# Patient Record
Sex: Female | Born: 1960 | Race: White | Hispanic: No | Marital: Single | Smoking: Current every day smoker
Health system: Southern US, Community
[De-identification: ages and names within clinical notes are randomized; demographics above are authoritative.]

## PROBLEM LIST (undated history)

## (undated) DIAGNOSIS — Z9889 Other specified postprocedural states: Secondary | ICD-10-CM

## (undated) DIAGNOSIS — H9190 Unspecified hearing loss, unspecified ear: Secondary | ICD-10-CM

## (undated) DIAGNOSIS — F419 Anxiety disorder, unspecified: Secondary | ICD-10-CM

## (undated) DIAGNOSIS — R110 Nausea: Secondary | ICD-10-CM

## (undated) DIAGNOSIS — N809 Endometriosis, unspecified: Secondary | ICD-10-CM

## (undated) DIAGNOSIS — I10 Essential (primary) hypertension: Secondary | ICD-10-CM

## (undated) DIAGNOSIS — E785 Hyperlipidemia, unspecified: Secondary | ICD-10-CM

## (undated) DIAGNOSIS — H539 Unspecified visual disturbance: Principal | ICD-10-CM

## (undated) DIAGNOSIS — C801 Malignant (primary) neoplasm, unspecified: Secondary | ICD-10-CM

## (undated) DIAGNOSIS — F32A Depression, unspecified: Secondary | ICD-10-CM

## (undated) DIAGNOSIS — J45909 Unspecified asthma, uncomplicated: Secondary | ICD-10-CM

## (undated) DIAGNOSIS — R05 Cough: Secondary | ICD-10-CM

## (undated) DIAGNOSIS — Z923 Personal history of irradiation: Secondary | ICD-10-CM

## (undated) DIAGNOSIS — R1310 Dysphagia, unspecified: Secondary | ICD-10-CM

## (undated) DIAGNOSIS — R51 Headache: Secondary | ICD-10-CM

## (undated) DIAGNOSIS — M419 Scoliosis, unspecified: Secondary | ICD-10-CM

## (undated) DIAGNOSIS — F329 Major depressive disorder, single episode, unspecified: Secondary | ICD-10-CM

## (undated) DIAGNOSIS — R413 Other amnesia: Secondary | ICD-10-CM

## (undated) DIAGNOSIS — R070 Pain in throat: Secondary | ICD-10-CM

## (undated) DIAGNOSIS — M1990 Unspecified osteoarthritis, unspecified site: Secondary | ICD-10-CM

## (undated) DIAGNOSIS — IMO0002 Reserved for concepts with insufficient information to code with codable children: Secondary | ICD-10-CM

## (undated) DIAGNOSIS — IMO0001 Reserved for inherently not codable concepts without codable children: Secondary | ICD-10-CM

## (undated) DIAGNOSIS — R112 Nausea with vomiting, unspecified: Secondary | ICD-10-CM

## (undated) DIAGNOSIS — E7800 Pure hypercholesterolemia, unspecified: Secondary | ICD-10-CM

## (undated) DIAGNOSIS — R059 Cough, unspecified: Secondary | ICD-10-CM

## (undated) HISTORY — DX: Pain in throat: R07.0

## (undated) HISTORY — DX: Hyperlipidemia, unspecified: E78.5

## (undated) HISTORY — DX: Other amnesia: R41.3

## (undated) HISTORY — DX: Unspecified osteoarthritis, unspecified site: M19.90

## (undated) HISTORY — DX: Dysphagia, unspecified: R13.10

## (undated) HISTORY — DX: Unspecified hearing loss, unspecified ear: H91.90

## (undated) HISTORY — PX: SHOULDER SURGERY: SHX246

## (undated) HISTORY — DX: Major depressive disorder, single episode, unspecified: F32.9

## (undated) HISTORY — PX: CERVICAL LAMINECTOMY: SHX94

## (undated) HISTORY — DX: Nausea: R11.0

## (undated) HISTORY — DX: Endometriosis, unspecified: N80.9

## (undated) HISTORY — DX: Depression, unspecified: F32.A

## (undated) HISTORY — DX: Unspecified visual disturbance: H53.9

## (undated) HISTORY — DX: Headache: R51

---

## 1985-10-06 HISTORY — PX: ABDOMINAL HYSTERECTOMY: SHX81

## 1998-04-02 ENCOUNTER — Other Ambulatory Visit: Admission: RE | Admit: 1998-04-02 | Discharge: 1998-04-02 | Payer: Self-pay | Admitting: Obstetrics and Gynecology

## 1998-10-11 ENCOUNTER — Encounter: Admission: RE | Admit: 1998-10-11 | Discharge: 1999-01-09 | Payer: Self-pay | Admitting: Anesthesiology

## 1998-12-04 ENCOUNTER — Ambulatory Visit (HOSPITAL_COMMUNITY): Admission: RE | Admit: 1998-12-04 | Discharge: 1998-12-04 | Payer: Self-pay | Admitting: Specialist

## 1998-12-04 ENCOUNTER — Encounter: Payer: Self-pay | Admitting: Specialist

## 1998-12-20 ENCOUNTER — Encounter: Payer: Self-pay | Admitting: Specialist

## 1998-12-27 ENCOUNTER — Encounter: Payer: Self-pay | Admitting: Specialist

## 1998-12-27 ENCOUNTER — Observation Stay (HOSPITAL_COMMUNITY): Admission: RE | Admit: 1998-12-27 | Discharge: 1998-12-28 | Payer: Self-pay | Admitting: Specialist

## 2001-04-07 ENCOUNTER — Encounter: Admission: RE | Admit: 2001-04-07 | Discharge: 2001-04-07 | Payer: Self-pay | Admitting: Family Medicine

## 2001-04-07 ENCOUNTER — Encounter: Payer: Self-pay | Admitting: Family Medicine

## 2003-08-11 ENCOUNTER — Encounter: Admission: RE | Admit: 2003-08-11 | Discharge: 2003-08-11 | Payer: Self-pay | Admitting: Family Medicine

## 2004-04-05 ENCOUNTER — Encounter: Admission: RE | Admit: 2004-04-05 | Discharge: 2004-04-05 | Payer: Self-pay | Admitting: Neurosurgery

## 2004-04-24 ENCOUNTER — Ambulatory Visit (HOSPITAL_COMMUNITY): Admission: RE | Admit: 2004-04-24 | Discharge: 2004-04-25 | Payer: Self-pay | Admitting: Neurosurgery

## 2004-05-30 ENCOUNTER — Encounter: Admission: RE | Admit: 2004-05-30 | Discharge: 2004-05-30 | Payer: Self-pay | Admitting: Neurosurgery

## 2004-09-17 ENCOUNTER — Encounter: Admission: RE | Admit: 2004-09-17 | Discharge: 2004-09-17 | Payer: Self-pay | Admitting: Family Medicine

## 2006-12-04 ENCOUNTER — Encounter: Admission: RE | Admit: 2006-12-04 | Discharge: 2006-12-04 | Payer: Self-pay | Admitting: Family Medicine

## 2009-06-15 ENCOUNTER — Encounter: Admission: RE | Admit: 2009-06-15 | Discharge: 2009-06-15 | Payer: Self-pay | Admitting: Family Medicine

## 2009-08-19 ENCOUNTER — Emergency Department (HOSPITAL_COMMUNITY): Admission: EM | Admit: 2009-08-19 | Discharge: 2009-08-19 | Payer: Self-pay | Admitting: Emergency Medicine

## 2009-11-01 ENCOUNTER — Encounter: Admission: RE | Admit: 2009-11-01 | Discharge: 2009-11-01 | Payer: Self-pay | Admitting: Neurosurgery

## 2009-11-26 ENCOUNTER — Ambulatory Visit (HOSPITAL_COMMUNITY): Admission: RE | Admit: 2009-11-26 | Discharge: 2009-11-27 | Payer: Self-pay | Admitting: Orthopedic Surgery

## 2010-11-28 ENCOUNTER — Encounter (HOSPITAL_BASED_OUTPATIENT_CLINIC_OR_DEPARTMENT_OTHER)
Admission: RE | Admit: 2010-11-28 | Discharge: 2010-11-28 | Disposition: A | Discharge status: Home / Self Care | Payer: BC Managed Care – PPO | Source: Ambulatory Visit | Attending: Orthopedic Surgery | Admitting: Orthopedic Surgery

## 2010-11-28 DIAGNOSIS — Z01812 Encounter for preprocedural laboratory examination: Secondary | ICD-10-CM | POA: Insufficient documentation

## 2010-11-28 DIAGNOSIS — Z01810 Encounter for preprocedural cardiovascular examination: Secondary | ICD-10-CM | POA: Insufficient documentation

## 2010-11-28 LAB — BASIC METABOLIC PANEL
CO2: 27 mEq/L (ref 19–32)
Calcium: 9.3 mg/dL (ref 8.4–10.5)
Chloride: 101 mEq/L (ref 96–112)
GFR calc non Af Amer: 57 mL/min — ABNORMAL LOW (ref >60)

## 2010-12-03 ENCOUNTER — Ambulatory Visit (HOSPITAL_BASED_OUTPATIENT_CLINIC_OR_DEPARTMENT_OTHER)
Admission: RE | Admit: 2010-12-03 | Discharge: 2010-12-04 | Disposition: A | Discharge status: Home / Self Care | Payer: BC Managed Care – PPO | Source: Ambulatory Visit | Attending: Orthopedic Surgery | Admitting: Orthopedic Surgery

## 2010-12-03 DIAGNOSIS — J449 Chronic obstructive pulmonary disease, unspecified: Secondary | ICD-10-CM | POA: Insufficient documentation

## 2010-12-03 DIAGNOSIS — Z01812 Encounter for preprocedural laboratory examination: Secondary | ICD-10-CM | POA: Insufficient documentation

## 2010-12-03 DIAGNOSIS — J4489 Other specified chronic obstructive pulmonary disease: Secondary | ICD-10-CM | POA: Insufficient documentation

## 2010-12-03 DIAGNOSIS — M87029 Idiopathic aseptic necrosis of unspecified humerus: Secondary | ICD-10-CM | POA: Insufficient documentation

## 2010-12-03 DIAGNOSIS — I10 Essential (primary) hypertension: Secondary | ICD-10-CM | POA: Insufficient documentation

## 2010-12-03 DIAGNOSIS — F17200 Nicotine dependence, unspecified, uncomplicated: Secondary | ICD-10-CM | POA: Insufficient documentation

## 2010-12-03 DIAGNOSIS — IMO0002 Reserved for concepts with insufficient information to code with codable children: Secondary | ICD-10-CM | POA: Insufficient documentation

## 2010-12-03 DIAGNOSIS — K219 Gastro-esophageal reflux disease without esophagitis: Secondary | ICD-10-CM | POA: Insufficient documentation
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
## 2010-12-25 LAB — COMPREHENSIVE METABOLIC PANEL
Albumin: 3.9 g/dL (ref 3.5–5.2)
Alkaline Phosphatase: 113 U/L (ref 39–117)
Calcium: 9.3 mg/dL (ref 8.4–10.5)
Chloride: 101 mEq/L (ref 96–112)
GFR calc non Af Amer: >60 mL/min (ref >60)
Total Protein: 7.5 g/dL (ref 6.0–8.3)

## 2010-12-25 LAB — CBC
HCT: 43.2 % (ref 36.0–46.0)
MCV: 97.4 fL (ref 78.0–100.0)
RBC: 4.43 MIL/uL (ref 3.87–5.11)
WBC: 12.8 10*3/uL — ABNORMAL HIGH (ref 4.0–10.5)

## 2011-01-08 LAB — BASIC METABOLIC PANEL
CO2: 24 mEq/L (ref 19–32)
GFR calc Af Amer: >60 mL/min (ref >60)
Glucose, Bld: 85 mg/dL (ref 70–99)
Potassium: 3.2 mEq/L — ABNORMAL LOW (ref 3.5–5.1)
Sodium: 134 mEq/L — ABNORMAL LOW (ref 135–145)

## 2011-01-08 LAB — CBC
HCT: 41.2 % (ref 36.0–46.0)
Hemoglobin: 14.3 g/dL (ref 12.0–15.0)
MCHC: 34.8 g/dL (ref 30.0–36.0)
RBC: 4.24 MIL/uL (ref 3.87–5.11)
RDW: 13.4 % (ref 11.5–15.5)

## 2011-01-08 LAB — POCT I-STAT, CHEM 8
Calcium, Ion: 1.06 mmol/L — ABNORMAL LOW (ref 1.12–1.32)
Chloride: 100 mEq/L (ref 96–112)
Creatinine, Ser: 1 mg/dL (ref 0.4–1.2)
Glucose, Bld: 78 mg/dL (ref 70–99)
HCT: 44 % (ref 36.0–46.0)
Hemoglobin: 15 g/dL (ref 12.0–15.0)

## 2011-01-08 LAB — URINALYSIS, ROUTINE W REFLEX MICROSCOPIC
Glucose, UA: NEGATIVE mg/dL
Ketones, ur: NEGATIVE mg/dL
Leukocytes, UA: NEGATIVE
Nitrite: NEGATIVE
Protein, ur: NEGATIVE mg/dL

## 2011-01-08 LAB — RAPID URINE DRUG SCREEN, HOSP PERFORMED
Benzodiazepines: POSITIVE — AB
Cocaine: NOT DETECTED
Tetrahydrocannabinol: POSITIVE — AB

## 2011-01-08 LAB — URINE MICROSCOPIC-ADD ON

## 2011-02-21 NOTE — Op Note (Signed)
NAME:  Stacey Carroll, Stacey Carroll                          ACCOUNT NO.:  [id number]   MEDICAL RECORD NO.:  [id number]                   PATIENT TYPE:  OIB   LOCATION:  2899                                 FACILITY:  [facility]   PHYSICIAN:  Donalee Citrin, M.D.                     DATE OF BIRTH:  Dec 20, 1960   DATE OF PROCEDURE:  04/24/2004  DATE OF DISCHARGE:                                 OPERATIVE REPORT   PREOPERATIVE DIAGNOSIS:  Severe cervical spondylosis with spondylitis  compression of the C6 and C7 nerve roots on the right.   POSTOPERATIVE DIAGNOSIS:  Severe cervical spondylosis with spondylitis  compression of the C6 and C7 nerve roots on the right.   OPERATION PERFORMED:  Anterior cervical diskectomy and fusion at C5-6 and C6-  7 using  6 mm patellar wedges, 37 mm Atlantis Vision plate and six 13 mm  variable angle screws.   SURGEON:  Donalee Citrin, M.D.   ASSISTANT:  Tia Alert, MD   ANESTHESIA:  General endotracheal.   INDICATIONS FOR PROCEDURE:  The patient is a very pleasant 50 year old  female who has had longstanding neck and right arm pain radiating down to  the thumb and first two fingers of her right hand.  The patient also  developed weakness of the biceps and triceps.  This got progressively worse,  failed all forms of conservative treatment.  Preoperative imaging showed  severe spondylitic disease with degenerative disk disease and collapse at C5-  6 and C6-7 and spondylitic compression of the C6 and C7 neural foramina.  Due to the patient's failure of conservative treatment, preoperative imaging  showing weakness in the C6-C7 nerve root distribution.  The patient was  recommended anterior cervical diskectomy and fusion.  I extensively went  over the risks and benefits of surgery with her.  She understands and agreed  to proceed forward.   DESCRIPTION OF PROCEDURE:  The patient was brought to the operating room was  induced under general anesthesia, placed supine position  with neck in slight  extension with five pounds of halter traction.  The right side of the neck  was prepped and draped in the usual sterile fashion after preoperative x-ray  localized the C5-6 disk space.  A curvilinear incision was made just  inferior to this just off the midline extended toward the  sternocleidomastoid muscle and the superficial layer of the platysmas was  dissected out and divided longitudinally.  The avascular plane between the  sternocleidomastoid muscle and the strap muscles was developed down to  prevertebral fascia.  The prevertebral fascia was incised with Kitners.  Then intraoperative x-ray confirmed localization of the C5-6 disk space.  This was marked by doing a small annulotomy with a 15 blade scalpel.  Then  the Bovie electrocautery was used to reflect the longus colli laterally at  both C5-6 and C6-7.  The anterior margins  of the vertebral bodies were  cleaned out also with electrocautery.  A self-retaining retractor was  placed.  Annulotomy was extended to C5-6 as well as drawn out to C6-7.  Pituitary rongeurs were used __________ annulus and there was large anterior  osteophytic complexes coming off C5-6 and C6-7 interspaces.  These were all  bitten off with Leksell rongeur and a Theatre manager.  Then using a 2 mm  Kerrison punch, the remainder of the anterior osteophytes were underbitten  at both these levels and a high speed drill was used to drill down the  interspace first at C5-6 through the posterior annulus and posterior  osteophytic complexes at both C5-6 and C6-7. Then the operative microscope  was draped and brought into the field.  Under microscopic illumination,  first at C5-6 the remainder of the osteophyte was drilled down at C5-6.  Then using a 1 and 2 mm Kerrison punch, the posterior annulus and posterior  longitudinal ligament were underbitten as well as the large osteophyte  coming off the C6 vertebral body.  The thecal sac was identified.   There was  noted to be severe uncinate hypertrophy compressing both C6 neural foramina.  These were underbitten with a 1 and 2 mm Kerrison punch decompressing the  thecal sac and the proximal aspect of both C6 nerve roots.  There was noted  to be severe spondylitic compression on the C6 nerve root, especially on the  right.  This was decompression out the foramen, explored with an angled  nerve hook and noted to have no further stenosis on each nerve root.  Then  Gelfoam was placed in this interspace and attention was taken to C6-7.  At  C6-7 she predominantly had a very  large osteophyte coming off the C7  vertebral body compressing the right side of her thecal sac and right C7  neural foramina, so in order to get down to it initially this was drilled  down to the posterior osteophytic complex and the longitudinal ligament.  This was underbitten with a 1 mm Kerrison punch decompressing the left-sided  C7 nerve root and through this, we obtained our entry underneath the  posterior longitudinal ligament on the right and removal of the osteophyte  on the right was accomplished with a high speed drill and a 2 mm Kerrison  punch.  At the end of diskectomy, both C7 neural foramina were widely  patent.  The angled black nerve hook was used to explore both neural  foramina and both nerve roots were decompressed.  Then the end plates were  scraped with a __________  upgoing curet at both levels to prepare for the  arthrodesis.  Two 6 mm patellar wedges were sized, selected  and inserted 1  mm deep to the anterior vertebral body line.  The remainder of the anterior  vertebral bodies were contoured to the osteophyte complex to receive the  plate and a 60.4 mm Atlantis Vision plate was selected.  This appeared to be  appropriate size.  Screw holes were drilled in the C5 body and the opposite  corner of the C7 body and two 13 mm variable angle screws which were placed. Fluoroscopy confirmed these  screws to be at proper length and appropriate  position, so the remainder of the four screws were inserted and drilled.  All screws had excellent purchase.  Screws tightened down.  The wound was  copiously irrigated and meticulous hemostasis was maintained.  The platysma  was reapproximated with 3-0  interrupted Vicryl, the subcutaneous tissues  were closed with 2-0 interrupted Vicryl and the skin was closed with running  4-0 subcuticular.  Benzoin and Steri-Strips were applied.  The patient was  then transferred to the recovery room in stable condition.  At the end of  the case, sponge, needle and instrument counts were correct.                                               Donalee Citrin, M.D.    GC/MEDQ  D:  04/24/2004  T:  04/25/2004  Job:  [id number]

## 2012-01-13 ENCOUNTER — Ambulatory Visit: Payer: BC Managed Care – PPO

## 2012-01-13 ENCOUNTER — Other Ambulatory Visit: Payer: Self-pay | Admitting: Family Medicine

## 2012-01-13 DIAGNOSIS — Z1231 Encounter for screening mammogram for malignant neoplasm of breast: Secondary | ICD-10-CM

## 2012-01-19 ENCOUNTER — Ambulatory Visit
Admission: RE | Admit: 2012-01-19 | Discharge: 2012-01-19 | Disposition: A | Discharge status: Home / Self Care | Payer: BC Managed Care – PPO | Source: Ambulatory Visit | Attending: Family Medicine | Admitting: Family Medicine

## 2012-01-19 DIAGNOSIS — Z1231 Encounter for screening mammogram for malignant neoplasm of breast: Secondary | ICD-10-CM

## 2012-11-09 ENCOUNTER — Ambulatory Visit (HOSPITAL_COMMUNITY)
Admission: RE | Admit: 2012-11-09 | Discharge: 2012-11-09 | Disposition: A | Discharge status: Home / Self Care | Payer: Self-pay | Source: Ambulatory Visit | Attending: Physical Medicine and Rehabilitation | Admitting: Physical Medicine and Rehabilitation

## 2012-11-09 ENCOUNTER — Other Ambulatory Visit (HOSPITAL_COMMUNITY): Payer: Self-pay | Admitting: Physical Medicine and Rehabilitation

## 2012-11-09 DIAGNOSIS — M25559 Pain in unspecified hip: Secondary | ICD-10-CM

## 2012-11-09 DIAGNOSIS — M51379 Other intervertebral disc degeneration, lumbosacral region without mention of lumbar back pain or lower extremity pain: Secondary | ICD-10-CM | POA: Insufficient documentation

## 2012-11-09 DIAGNOSIS — M5137 Other intervertebral disc degeneration, lumbosacral region: Secondary | ICD-10-CM | POA: Insufficient documentation

## 2012-11-09 DIAGNOSIS — M8569 Other cyst of bone, multiple sites: Secondary | ICD-10-CM | POA: Insufficient documentation

## 2013-03-01 ENCOUNTER — Other Ambulatory Visit: Payer: Self-pay

## 2013-03-01 DIAGNOSIS — Z1231 Encounter for screening mammogram for malignant neoplasm of breast: Secondary | ICD-10-CM

## 2013-03-11 ENCOUNTER — Ambulatory Visit (INDEPENDENT_AMBULATORY_CARE_PROVIDER_SITE_OTHER): Payer: BC Managed Care – PPO | Admitting: Neurology

## 2013-03-11 ENCOUNTER — Encounter: Payer: Self-pay | Admitting: Neurology

## 2013-03-11 ENCOUNTER — Ambulatory Visit
Admission: RE | Admit: 2013-03-11 | Discharge: 2013-03-11 | Disposition: A | Discharge status: Home / Self Care | Payer: BC Managed Care – PPO | Source: Ambulatory Visit

## 2013-03-11 VITALS — BP 104/75 | HR 81 | Ht 64.0 in | Wt 151.0 lb

## 2013-03-11 DIAGNOSIS — H539 Unspecified visual disturbance: Secondary | ICD-10-CM

## 2013-03-11 DIAGNOSIS — Z1231 Encounter for screening mammogram for malignant neoplasm of breast: Secondary | ICD-10-CM

## 2013-03-11 HISTORY — DX: Unspecified visual disturbance: H53.9
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
## 2013-04-29 ENCOUNTER — Other Ambulatory Visit: Payer: BC Managed Care – PPO

## 2013-10-06 DIAGNOSIS — C801 Malignant (primary) neoplasm, unspecified: Secondary | ICD-10-CM

## 2013-10-06 HISTORY — DX: Malignant (primary) neoplasm, unspecified: C80.1

## 2014-02-14 ENCOUNTER — Other Ambulatory Visit: Payer: Self-pay | Admitting: Orthopaedic Surgery

## 2014-02-14 DIAGNOSIS — M479 Spondylosis, unspecified: Secondary | ICD-10-CM

## 2014-02-24 ENCOUNTER — Ambulatory Visit
Admission: RE | Admit: 2014-02-24 | Discharge: 2014-02-24 | Disposition: A | Discharge status: Home / Self Care | Payer: BC Managed Care – PPO | Source: Ambulatory Visit | Attending: Orthopaedic Surgery | Admitting: Orthopaedic Surgery

## 2014-02-24 VITALS — BP 153/94 | HR 62

## 2014-02-24 DIAGNOSIS — M479 Spondylosis, unspecified: Secondary | ICD-10-CM

## 2014-02-24 MED ORDER — HYDROMORPHONE HCL PF 1 MG/ML IJ SOLN
1.0000 mg | Freq: Once | INTRAMUSCULAR | Status: AC
  Administered 2014-02-24: 1 mg via INTRAMUSCULAR

## 2014-02-24 MED ORDER — DIAZEPAM 5 MG PO TABS
10.0000 mg | ORAL_TABLET | Freq: Once | ORAL | Status: AC
  Administered 2014-02-24: 10 mg via ORAL

## 2014-02-24 MED ORDER — ONDANSETRON HCL 4 MG/2ML IJ SOLN
4.0000 mg | Freq: Once | INTRAMUSCULAR | Status: AC
  Administered 2014-02-24: 4 mg via INTRAMUSCULAR

## 2014-02-24 MED ORDER — IOHEXOL 300 MG/ML  SOLN
10.0000 mL | Freq: Once | INTRAMUSCULAR | Status: AC | PRN
Start: 2014-02-24 — End: 2014-02-24
  Administered 2014-02-24: 10 mL via INTRATHECAL

## 2014-02-24 NOTE — Progress Notes (Signed)
Pt states she has been off wellbutrin, adderall and paxil since Tuesday night. discharge instructions explained to pt.

## 2014-02-24 NOTE — Discharge Instructions (Signed)
Myelogram Discharge Instructions  1. Go home and rest quietly for the next 24 hours.  It is important to lie flat for the next 24 hours.  Get up only to go to the restroom.  You may lie in the bed or on a couch on your back, your stomach, your left side or your right side.  You may have one pillow under your head.  You may have pillows between your knees while you are on your side or under your knees while you are on your back.  2. DO NOT drive today.  Recline the seat as far back as it will go, while still wearing your seat belt, on the way home.  3. You may get up to go to the bathroom as needed.  You may sit up for 10 minutes to eat.  You may resume your normal diet and medications unless otherwise indicated.  Drink lots of extra fluids today and tomorrow.  4. The incidence of headache, nausea, or vomiting is about 5% (one in 20 patients).  If you develop a headache, lie flat and drink plenty of fluids until the headache goes away.  Caffeinated beverages may be helpful.  If you develop severe nausea and vomiting or a headache that does not go away with flat bed rest, call [phone].  5. You may resume normal activities after your 24 hours of bed rest is over; however, do not exert yourself strongly or do any heavy lifting tomorrow. If when you get up you have a headache when standing, go back to bed and force fluids for another 24 hours.  6. Call your physician for a follow-up appointment.  The results of your myelogram will be sent directly to your physician by the following day.  7. If you have any questions or if complications develop after you arrive home, please call [phone].  Discharge instructions have been explained to the patient.  The patient, or the person responsible for the patient, fully understands these instructions.      May resume Wellbutrin, Adderall, and Paxil on Feb 25, 2014, after 11:00 am.

## 2014-03-24 ENCOUNTER — Other Ambulatory Visit: Payer: Self-pay | Admitting: Orthopaedic Surgery

## 2014-03-24 DIAGNOSIS — M545 Low back pain, unspecified: Secondary | ICD-10-CM

## 2014-03-25 ENCOUNTER — Ambulatory Visit
Admission: RE | Admit: 2014-03-25 | Discharge: 2014-03-25 | Disposition: A | Discharge status: Home / Self Care | Payer: BC Managed Care – PPO | Source: Ambulatory Visit | Attending: Orthopaedic Surgery | Admitting: Orthopaedic Surgery

## 2014-03-25 DIAGNOSIS — M545 Low back pain, unspecified: Secondary | ICD-10-CM

## 2014-03-31 ENCOUNTER — Other Ambulatory Visit: Payer: BC Managed Care – PPO

## 2014-04-14 ENCOUNTER — Encounter (HOSPITAL_COMMUNITY): Payer: Self-pay | Admitting: Emergency Medicine

## 2014-04-14 ENCOUNTER — Encounter (HOSPITAL_COMMUNITY): Payer: BC Managed Care – PPO | Admitting: Anesthesiology

## 2014-04-14 ENCOUNTER — Emergency Department (HOSPITAL_COMMUNITY): Payer: BC Managed Care – PPO

## 2014-04-14 ENCOUNTER — Ambulatory Visit (HOSPITAL_COMMUNITY)
Admission: EM | Admit: 2014-04-14 | Discharge: 2014-04-14 | Disposition: A | Discharge status: Home / Self Care | Payer: BC Managed Care – PPO | Attending: Emergency Medicine | Admitting: Emergency Medicine

## 2014-04-14 ENCOUNTER — Encounter (HOSPITAL_COMMUNITY)
Admission: EM | Disposition: A | Discharge status: Home / Self Care | Payer: Self-pay | Source: Home / Self Care | Attending: Emergency Medicine

## 2014-04-14 ENCOUNTER — Emergency Department (HOSPITAL_COMMUNITY): Payer: BC Managed Care – PPO | Admitting: Anesthesiology

## 2014-04-14 DIAGNOSIS — F329 Major depressive disorder, single episode, unspecified: Secondary | ICD-10-CM | POA: Diagnosis not present

## 2014-04-14 DIAGNOSIS — D499 Neoplasm of unspecified behavior of unspecified site: Secondary | ICD-10-CM

## 2014-04-14 DIAGNOSIS — I10 Essential (primary) hypertension: Secondary | ICD-10-CM | POA: Diagnosis not present

## 2014-04-14 DIAGNOSIS — R221 Localized swelling, mass and lump, neck: Secondary | ICD-10-CM

## 2014-04-14 DIAGNOSIS — F411 Generalized anxiety disorder: Secondary | ICD-10-CM | POA: Insufficient documentation

## 2014-04-14 DIAGNOSIS — J392 Other diseases of pharynx: Secondary | ICD-10-CM

## 2014-04-14 DIAGNOSIS — C100 Malignant neoplasm of vallecula: Secondary | ICD-10-CM | POA: Diagnosis not present

## 2014-04-14 DIAGNOSIS — E7800 Pure hypercholesterolemia, unspecified: Secondary | ICD-10-CM | POA: Diagnosis not present

## 2014-04-14 DIAGNOSIS — F17200 Nicotine dependence, unspecified, uncomplicated: Secondary | ICD-10-CM | POA: Insufficient documentation

## 2014-04-14 DIAGNOSIS — F3289 Other specified depressive episodes: Secondary | ICD-10-CM | POA: Insufficient documentation

## 2014-04-14 HISTORY — DX: Anxiety disorder, unspecified: F41.9

## 2014-04-14 HISTORY — DX: Essential (primary) hypertension: I10

## 2014-04-14 HISTORY — DX: Pure hypercholesterolemia, unspecified: E78.00

## 2014-04-14 HISTORY — DX: Scoliosis, unspecified: M41.9

## 2014-04-14 HISTORY — PX: LARYNGOSCOPY AND ESOPHAGOSCOPY: SHX5660

## 2014-04-14 LAB — CBC WITH DIFFERENTIAL/PLATELET
Basophils Absolute: 0.1 10*3/uL (ref 0.0–0.1)
Basophils Relative: 0 % (ref 0–1)
Eosinophils Absolute: 0.2 10*3/uL (ref 0.0–0.7)
Eosinophils Relative: 1 % (ref 0–5)
HCT: 44 % (ref 36.0–46.0)
HEMOGLOBIN: 15.5 g/dL — AB (ref 12.0–15.0)
LYMPHS ABS: 2.9 10*3/uL (ref 0.7–4.0)
Lymphocytes Relative: 22 % (ref 12–46)
MCH: 32 pg (ref 26.0–34.0)
MCHC: 35.2 g/dL (ref 30.0–36.0)
MCV: 90.7 fL (ref 78.0–100.0)
MONOS PCT: 13 % — AB (ref 3–12)
Monocytes Absolute: 1.6 10*3/uL — ABNORMAL HIGH (ref 0.1–1.0)
NEUTROS ABS: 8.1 10*3/uL — AB (ref 1.7–7.7)
NEUTROS PCT: 64 % (ref 43–77)
Platelets: 347 10*3/uL (ref 150–400)
RBC: 4.85 MIL/uL (ref 3.87–5.11)
RDW: 13.5 % (ref 11.5–15.5)
WBC: 12.8 10*3/uL — AB (ref 4.0–10.5)

## 2014-04-14 LAB — COMPREHENSIVE METABOLIC PANEL
ALBUMIN: 4 g/dL (ref 3.5–5.2)
ALK PHOS: 117 U/L (ref 39–117)
ALT: 13 U/L (ref 0–35)
ANION GAP: 15 (ref 5–15)
AST: 17 U/L (ref 0–37)
BILIRUBIN TOTAL: 0.3 mg/dL (ref 0.3–1.2)
BUN: 18 mg/dL (ref 6–23)
CHLORIDE: 101 meq/L (ref 96–112)
CO2: 25 mEq/L (ref 19–32)
Calcium: 10 mg/dL (ref 8.4–10.5)
Creatinine, Ser: 0.88 mg/dL (ref 0.50–1.10)
GFR calc non Af Amer: 73 mL/min — ABNORMAL LOW (ref >90)
GFR, EST AFRICAN AMERICAN: 85 mL/min — AB (ref >90)
GLUCOSE: 109 mg/dL — AB (ref 70–99)
POTASSIUM: 4.2 meq/L (ref 3.7–5.3)
SODIUM: 141 meq/L (ref 137–147)
Total Protein: 7.7 g/dL (ref 6.0–8.3)

## 2014-04-14 LAB — RAPID STREP SCREEN (MED CTR MEBANE ONLY): STREPTOCOCCUS, GROUP A SCREEN (DIRECT): NEGATIVE

## 2014-04-14 LAB — TROPONIN I: Troponin I: <0.3 ng/mL (ref <0.30)

## 2014-04-14 SURGERY — LARYNGOSCOPY, WITH ESOPHAGOSCOPY
Anesthesia: General | Site: Throat

## 2014-04-14 MED ORDER — MIDAZOLAM HCL 5 MG/5ML IJ SOLN
INTRAMUSCULAR | Status: DC | PRN
  Administered 2014-04-14: 2 mg via INTRAVENOUS

## 2014-04-14 MED ORDER — ONDANSETRON HCL 4 MG/2ML IJ SOLN
INTRAMUSCULAR | Status: AC
  Filled 2014-04-14: qty 2

## 2014-04-14 MED ORDER — SUCCINYLCHOLINE CHLORIDE 20 MG/ML IJ SOLN
INTRAMUSCULAR | Status: DC | PRN
  Administered 2014-04-14: 120 mg via INTRAVENOUS

## 2014-04-14 MED ORDER — EPINEPHRINE HCL (NASAL) 0.1 % NA SOLN
NASAL | Status: DC | PRN
  Administered 2014-04-14: 1 mL via TOPICAL

## 2014-04-14 MED ORDER — LIDOCAINE HCL (CARDIAC) 20 MG/ML IV SOLN
INTRAVENOUS | Status: DC | PRN
  Administered 2014-04-14: 100 mg via INTRAVENOUS

## 2014-04-14 MED ORDER — NEOSTIGMINE METHYLSULFATE 10 MG/10ML IV SOLN
INTRAVENOUS | Status: DC | PRN
  Administered 2014-04-14: 4 mg via INTRAVENOUS

## 2014-04-14 MED ORDER — LACTATED RINGERS IV SOLN
INTRAVENOUS | Status: DC
  Administered 2014-04-14: 16:00:00 via INTRAVENOUS

## 2014-04-14 MED ORDER — NEOSTIGMINE METHYLSULFATE 10 MG/10ML IV SOLN
INTRAVENOUS | Status: AC
  Filled 2014-04-14: qty 1

## 2014-04-14 MED ORDER — ONDANSETRON HCL 4 MG/2ML IJ SOLN
INTRAMUSCULAR | Status: DC | PRN
  Administered 2014-04-14: 4 mg via INTRAVENOUS

## 2014-04-14 MED ORDER — LIDOCAINE-EPINEPHRINE 1 %-1:100000 IJ SOLN
INTRAMUSCULAR | Status: AC
  Filled 2014-04-14: qty 1

## 2014-04-14 MED ORDER — LIDOCAINE HCL (CARDIAC) 20 MG/ML IV SOLN
INTRAVENOUS | Status: AC
  Filled 2014-04-14: qty 5

## 2014-04-14 MED ORDER — DEXAMETHASONE SODIUM PHOSPHATE 10 MG/ML IJ SOLN
10.0000 mg | Freq: Once | INTRAMUSCULAR | Status: AC
  Administered 2014-04-14: 10 mg via INTRAVENOUS
  Filled 2014-04-14: qty 1

## 2014-04-14 MED ORDER — SUCCINYLCHOLINE CHLORIDE 20 MG/ML IJ SOLN
INTRAMUSCULAR | Status: AC
  Filled 2014-04-14: qty 1

## 2014-04-14 MED ORDER — ROCURONIUM BROMIDE 100 MG/10ML IV SOLN
INTRAVENOUS | Status: DC | PRN
  Administered 2014-04-14: 30 mg via INTRAVENOUS

## 2014-04-14 MED ORDER — SODIUM CHLORIDE 0.9 % IV BOLUS (SEPSIS)
1000.0000 mL | Freq: Once | INTRAVENOUS | Status: AC
  Administered 2014-04-14: 1000 mL via INTRAVENOUS

## 2014-04-14 MED ORDER — GLYCOPYRROLATE 0.2 MG/ML IJ SOLN
INTRAMUSCULAR | Status: AC
  Filled 2014-04-14: qty 3

## 2014-04-14 MED ORDER — FENTANYL CITRATE 0.05 MG/ML IJ SOLN
INTRAMUSCULAR | Status: DC | PRN
  Administered 2014-04-14: 100 ug via INTRAVENOUS
  Administered 2014-04-14 (×2): 50 ug via INTRAVENOUS

## 2014-04-14 MED ORDER — FENTANYL CITRATE 0.05 MG/ML IJ SOLN
INTRAMUSCULAR | Status: AC
  Filled 2014-04-14: qty 5

## 2014-04-14 MED ORDER — PROPOFOL 10 MG/ML IV BOLUS
INTRAVENOUS | Status: AC
  Filled 2014-04-14: qty 20

## 2014-04-14 MED ORDER — IOHEXOL 300 MG/ML  SOLN
75.0000 mL | Freq: Once | INTRAMUSCULAR | Status: AC | PRN
  Administered 2014-04-14: 75 mL via INTRAVENOUS

## 2014-04-14 MED ORDER — GLYCOPYRROLATE 0.2 MG/ML IJ SOLN
INTRAMUSCULAR | Status: DC | PRN
  Administered 2014-04-14: 0.6 mg via INTRAVENOUS

## 2014-04-14 MED ORDER — PROPOFOL 10 MG/ML IV BOLUS
INTRAVENOUS | Status: DC | PRN
  Administered 2014-04-14: 130 mg via INTRAVENOUS

## 2014-04-14 MED ORDER — EPINEPHRINE HCL 1 MG/ML IJ SOLN
INTRAMUSCULAR | Status: AC
  Filled 2014-04-14: qty 1

## 2014-04-14 MED ORDER — CLINDAMYCIN PHOSPHATE 600 MG/50ML IV SOLN
600.0000 mg | Freq: Once | INTRAVENOUS | Status: AC
  Administered 2014-04-14: 600 mg via INTRAVENOUS
  Filled 2014-04-14: qty 50

## 2014-04-14 MED ORDER — MIDAZOLAM HCL 2 MG/2ML IJ SOLN
INTRAMUSCULAR | Status: AC
  Filled 2014-04-14: qty 2

## 2014-04-14 SURGICAL SUPPLY — 43 items
BLADE SURG ROTATE 9660 (MISCELLANEOUS) IMPLANT
CANISTER SUCTION 2500CC (MISCELLANEOUS) ×4 IMPLANT
CLEANER TIP ELECTROSURG 2X2 (MISCELLANEOUS) ×4 IMPLANT
COAGULATOR SUCT 8FR VV (MISCELLANEOUS) ×3 IMPLANT
CONT SPEC 4OZ CLIKSEAL STRL BL (MISCELLANEOUS) ×6 IMPLANT
COVER SURGICAL LIGHT HANDLE (MISCELLANEOUS) ×4 IMPLANT
COVER TABLE BACK 60X90 (DRAPES) ×4 IMPLANT
DECANTER SPIKE VIAL GLASS SM (MISCELLANEOUS) ×4 IMPLANT
DRAPE PROXIMA HALF (DRAPES) ×4 IMPLANT
ELECT COATED BLADE 2.86 ST (ELECTRODE) ×4 IMPLANT
ELECT REM PT RETURN 9FT ADLT (ELECTROSURGICAL) ×4
ELECTRODE REM PT RTRN 9FT ADLT (ELECTROSURGICAL) ×2 IMPLANT
GAUZE SPONGE 4X4 16PLY XRAY LF (GAUZE/BANDAGES/DRESSINGS) ×4 IMPLANT
GAUZE XEROFORM 5X9 LF (GAUZE/BANDAGES/DRESSINGS) IMPLANT
GLOVE SURG SS PI 7.5 STRL IVOR (GLOVE) ×8 IMPLANT
GOWN STRL REUS W/ TWL LRG LVL3 (GOWN DISPOSABLE) ×5 IMPLANT
GOWN STRL REUS W/TWL LRG LVL3 (GOWN DISPOSABLE) ×8
HOLDER TRACH TUBE VELCRO 19.5 (MISCELLANEOUS) IMPLANT
KIT BASIN OR (CUSTOM PROCEDURE TRAY) ×7 IMPLANT
KIT ROOM TURNOVER OR (KITS) ×4 IMPLANT
KIT SUCTION CATH 14FR (SUCTIONS) IMPLANT
NDL HYPO 25GX1X1/2 BEV (NEEDLE) ×1 IMPLANT
NEEDLE HYPO 25GX1X1/2 BEV (NEEDLE) ×4 IMPLANT
NS IRRIG 1000ML POUR BTL (IV SOLUTION) ×4 IMPLANT
PACK EENT II TURBAN DRAPE (CUSTOM PROCEDURE TRAY) ×4 IMPLANT
PAD ARMBOARD 7.5X6 YLW CONV (MISCELLANEOUS) ×8 IMPLANT
PATTIES SURGICAL .5 X1 (DISPOSABLE) ×3 IMPLANT
PENCIL BUTTON HOLSTER BLD 10FT (ELECTRODE) ×7 IMPLANT
SOLUTION ANTI FOG 6CC (MISCELLANEOUS) ×4 IMPLANT
SPONGE GAUZE 4X4 12PLY (GAUZE/BANDAGES/DRESSINGS) ×4 IMPLANT
SPONGE INTESTINAL PEANUT (DISPOSABLE) IMPLANT
SUT CHROMIC 2 0 SH (SUTURE) ×7 IMPLANT
SUT ETHILON 2 0 FS 18 (SUTURE) ×8 IMPLANT
SUT SILK 2 0 FS (SUTURE) ×7 IMPLANT
SUT SILK 3 0 REEL (SUTURE) ×4 IMPLANT
SYR 20CC LL (SYRINGE) ×4 IMPLANT
SYR BULB IRRIGATION 50ML (SYRINGE) IMPLANT
SYR CONTROL 10ML LL (SYRINGE) ×4 IMPLANT
TOWEL OR 17X24 6PK STRL BLUE (TOWEL DISPOSABLE) ×4 IMPLANT
TOWEL OR 17X26 10 PK STRL BLUE (TOWEL DISPOSABLE) ×4 IMPLANT
TUBE CONNECTING 12'X1/4 (SUCTIONS) ×2
TUBE CONNECTING 12X1/4 (SUCTIONS) ×5 IMPLANT
WATER STERILE IRR 1000ML POUR (IV SOLUTION) ×4 IMPLANT

## 2014-04-14 NOTE — Anesthesia Preprocedure Evaluation (Addendum)
Anesthesia Evaluation  Patient identified by MRN, date of birth, ID band Patient awake    Reviewed: Allergy & Precautions, H&P , NPO status , Patient's Chart, lab work & pertinent test results  Airway Mallampati: II TM Distance: >3 FB Neck ROM: Full    Dental  (+) Teeth Intact, Dental Advisory Given, Missing,    Pulmonary Current Smoker,          Cardiovascular hypertension, Pt. on medications Rhythm:Regular Rate:Normal     Neuro/Psych  Headaches,    GI/Hepatic   Endo/Other    Renal/GU      Musculoskeletal   Abdominal   Peds  Hematology   Anesthesia Other Findings   Reproductive/Obstetrics                       Anesthesia Physical Anesthesia Plan  ASA: III  Anesthesia Plan: General   Post-op Pain Management:    Induction: Intravenous  Airway Management Planned: Oral ETT and Video Laryngoscope Planned  Additional Equipment:   Intra-op Plan:   Post-operative Plan: Possible Post-op intubation/ventilation  Informed Consent: I have reviewed the patients History and Physical, chart, labs and discussed the procedure including the risks, benefits and alternatives for the proposed anesthesia with the patient or authorized representative who has indicated his/her understanding and acceptance.     Plan Discussed with: CRNA and Surgeon  Anesthesia Plan Comments:        Anesthesia Quick Evaluation

## 2014-04-14 NOTE — Transfer of Care (Signed)
Immediate Anesthesia Transfer of Care Note  Patient: Stacey Carroll  Procedure(s) Performed: Procedure(s): DIRECT LARYNGOSCOPY WITH BIOPSY  AND ESOPHAGOSCOPY (N/A)  Patient Location: PACU  Anesthesia Type:General  Level of Consciousness: awake, alert  and oriented  Airway & Oxygen Therapy: Patient Spontanous Breathing and Patient connected to face mask oxygen  Post-op Assessment: Report given to PACU RN  Post vital signs: Reviewed and stable  Complications: No apparent anesthesia complications

## 2014-04-14 NOTE — Anesthesia Postprocedure Evaluation (Signed)
  Anesthesia Post-op Note  Patient: Stacey Carroll  Procedure(s) Performed: Procedure(s): DIRECT LARYNGOSCOPY WITH BIOPSY  AND ESOPHAGOSCOPY (N/A)  Patient Location: PACU  Anesthesia Type:General  Level of Consciousness: awake and alert   Airway and Oxygen Therapy: Patient Spontanous Breathing  Post-op Pain: none  Post-op Assessment: Post-op Vital signs reviewed, Patient's Cardiovascular Status Stable, Respiratory Function Stable, Patent Airway, No signs of Nausea or vomiting and Pain level controlled  Post-op Vital Signs: Reviewed and stable  Last Vitals:  Filed Vitals:   04/14/14 1920  BP: 150/89  Pulse: 71  Temp:   Resp: 13    Complications: No apparent anesthesia complications

## 2014-04-14 NOTE — Discharge Instructions (Signed)
Rx on chart for hydrocodone/acetaminophen, clindamycin, and prednisolone. May use OTC benadryl as needed for nausea. Soft diet as tolerated. Follow up with Dr. Simeon Craft in 5-7 days.

## 2014-04-14 NOTE — H&P (Signed)
04/14/2014  Stacey Carroll  PREOPERATIVE HISTORY AND PHYSICAL/CONSULT NOTE  CHIEF COMPLAINT: vallecular mass  HISTORY: This is a 53 year old F who presents with 4 months of weight loss and dysphagia. She has smoked 0.5 packs per day x 30 years. CT neck in the ER that I reviewed shows small <1cm bilateral cervical nodes but a >1cm enhancing mass in the vallecula and lingual surface of the epiglottis worrisome for a mass or neoplasm.  She  now presents for direct laryngoscopy with biopsy, esophagoscopy, and possible tracheotomy.  Dr. Simeon Craft, Al[place] Dust has discussed the risks (airway injury/blockage/need for emergency tracheotomy, bleeding, anoxic brain injury, death, etc.), benefits, and alternatives of this procedure. The patient understands the risks and would like to proceed with the procedure. The chances of success of the procedure are >50% and the patient understands this. I personally performed an examination of the patient within 24 hours of the procedure.  PAST MEDICAL HISTORY: Past Medical History  Diagnosis Date  . Visual disturbance 03/11/2013  . Dyslipidemia   . Headache(784.0)   . Depression   . Endometriosis   . Hypertension   . Hypercholesterolemia   . Anxiety   . Scoliosis     PAST SURGICAL HISTORY: Past Surgical History  Procedure Laterality Date  . Cervical laminectomy  2007 (?), 2011  . Shoulder surgery Left   . Abdominal hysterectomy  1987    MEDICATIONS: No current facility-administered medications on file prior to encounter.   No current outpatient prescriptions on file prior to encounter.    ALLERGIES: Allergies  Allergen Reactions  . Penicillins     From childhood   SOCIAL HISTORY:* History   Social History  . Marital Status: Single    Spouse Name: N/A    Number of Children: N/A  . Years of Education: N/A   Occupational History  . Not on file.   Social History Main Topics  . Smoking status: Current Every Day Smoker -- 0.50 packs/day   Types: Cigarettes  . Smokeless tobacco: Not on file  . Alcohol Use: No     Comment: occasional  . Drug Use: No  . Sexual Activity: Not on file   Other Topics Concern  . Not on file   Social History Narrative  . No narrative on file    FAMILY HISTORY:  Family History  Problem Relation Age of Onset  . Emphysema Mother   . Cancer Father   . Heart disease Father   . Cancer Brother     Leukemia    REVIEW OF SYSTEMS:  HEENT: dysphagia, sore throat, ear pain, weight loss, otherwise negative x 12 systems except per HPI   PHYSICAL EXAM:  GENERAL: NAD, no stridor  VITAL SIGNS:   Filed Vitals:   04/14/14 1112  BP: 145/89  Pulse: 82  Temp:   Resp: 18    SKIN:  Warm, dry HEENT:  Oral cavity grossly clear NECK:  supple LYMPH:  No palpable lymphadenopathy, midline trachea   ABDOMEN:  soft MUSCULOSKELETAL: normal strength PSYCH:  Normal affect NEUROLOGIC:  CN 2-12 intact and symmetric  DIAGNOSTIC STUDIES: neck CT shows vallecular mass worrisome for neoplasm  ASSESSMENT AND PLAN: Plan to proceed with direct laryngoscopy with biopsy, esophagoscopy, and possible tracheotomy. Patient understands the risks, benefits, and alternatives. Informed written consent signed, witnessed, and on chart. 04/14/2014  3:43 PM Stacey Carroll

## 2014-04-14 NOTE — Anesthesia Procedure Notes (Signed)
Procedure Name: Intubation Date/Time: 04/14/2014 4:56 PM Performed by: Barrington Ellison Pre-anesthesia Checklist: Patient identified, Emergency Drugs available, Suction available, Patient being monitored and Timeout performed Patient Re-evaluated:Patient Re-evaluated prior to inductionOxygen Delivery Method: Circle system utilized Preoxygenation: Pre-oxygenation with 100% oxygen Intubation Type: IV induction and Rapid sequence Ventilation: Mask ventilation without difficulty Laryngoscope Size: Mac and 4 Grade View: Grade I Tube type: Oral Tube size: 6.5 mm Number of attempts: 1 Airway Equipment and Method: Stylet and Video-laryngoscopy Placement Confirmation: ETT inserted through vocal cords under direct vision,  positive ETCO2 and breath sounds checked- equal and bilateral Secured at: 21 cm Tube secured with: Tape Dental Injury: Teeth and Oropharynx as per pre-operative assessment  Difficulty Due To: Difficulty was anticipated and Difficult Airway- due to anterior larynx Future Recommendations: Recommend- induction with short-acting agent, and alternative techniques readily available Comments: Elective Glide scope used due to patient pathology and anticipated difficult intubation.

## 2014-04-14 NOTE — ED Provider Notes (Signed)
CSN: [id number]     Arrival date & time 04/14/14  0902 History  This chart was scribed for non-physician practitioner, Jamse Mead, PA-C,working with Orpah Greek, MD, by Marlowe Kays, ED Scribe.  This patient was seen in room TR09C/TR09C and the patient's care was started at 10:07 AM.  Chief Complaint  Patient presents with  . Sore Throat  . Otalgia   The history is provided by the patient. No language interpreter was used.   HPI Comments:  Stacey Carroll is a 53 y.o. female with h/o GERD who presents to the Emergency Department complaining of severe, worsening sore throat that started approximately four months ago. Pt reports that she cannot swallow her medicine and is having difficulty eating. She states she was given a Z-Pak for these symptoms and was told she should have an ultrasound of the throat, but declined. She also reports associated moderate left ear pain that worsens with swallowing with drainage of the ear and subjective fever. She describes it as if something is in her ear that needs to come out with associated muffled hearing.She states that it feels as if her throat is closing up. She reports losing almost 90 pounds in the past few months. She was seen by her PCP last month and was told she needed IV antibiotics, but did not come in to the hospital. Pt has not taken anything for the pain or done anything to alleviate her symptoms. She had a back tooth extracted about 1.5 weeks ago in which she cannot remember the name of the antibiotic she was given. She denies vomiting, abdominal pain, numbness, tingling or swelling of the face. Pt states she is a smoker of approximately 0.5 PPD for over 30 years. Pt's PCP is Dr. Justin Mend at [facility].   Past Medical History  Diagnosis Date  . Visual disturbance 03/11/2013  . Dyslipidemia   . Headache(784.0)   . Depression   . Endometriosis   . Hypertension   . Hypercholesterolemia   . Anxiety   . Scoliosis    Past Surgical History   Procedure Laterality Date  . Cervical laminectomy  2007 (?), 2011  . Shoulder surgery Left   . Abdominal hysterectomy  1987   Family History  Problem Relation Age of Onset  . Emphysema Mother   . Cancer Father   . Heart disease Father   . Cancer Brother     Leukemia   History  Substance Use Topics  . Smoking status: Current Every Day Smoker -- 0.50 packs/day    Types: Cigarettes  . Smokeless tobacco: Not on file  . Alcohol Use: No     Comment: occasional   OB History   Grav Para Term Preterm Abortions TAB SAB Ect Mult Living                 Review of Systems  Constitutional: Positive for fever.  HENT: Positive for ear pain and sore throat. Negative for facial swelling.   Gastrointestinal: Negative for vomiting and abdominal pain.  Neurological: Negative for numbness.    Allergies  Penicillins  Home Medications   Prior to Admission medications   Medication Sig Start Date End Date Taking? Authorizing Provider  diazepam (VALIUM) 10 MG tablet Take 10 mg by mouth every 6 (six) hours as needed for anxiety.   Yes Historical Provider, MD   Triage Vitals: BP 129/87  Pulse 97  Temp(Src) 98.2 F (36.8 C) (Oral)  Resp 18  SpO2 99% Physical Exam  Nursing  note and vitals reviewed. Constitutional: She is oriented to person, place, and time. She appears well-developed and well-nourished. No distress.  HENT:  Head: Normocephalic and atraumatic.  Negative facial deformities noted.  Negative facial swelling. Negative trismus. Uvula midline without deviation or swelling. Second premolar has been extracted with a graft placed with negative swelling, erythema, inflammation, lesions, sores, bleeding, drainage noted. Negative sublingual lesions. Negative swelling, erythema, formation, lesions, sores, exudate identified to the posterior oropharynx and bilateral tonsils.  Eyes: Conjunctivae and EOM are normal. Pupils are equal, round, and reactive to light. Right eye exhibits no  discharge. Left eye exhibits no discharge.  Neck: Normal range of motion. Neck supple. No tracheal deviation present. No thyromegaly present.  Decreased ROM to the neck secondary to pain - patient has history of bulging cervical disc, due to get surgery soon  Negative cervical lymphadenopathy  Negative meningeal signs  Negative tracheal deviation noted Negative swelling to the neck, negative deformities Negative pain upon palpation   Cardiovascular: Normal rate, regular rhythm and normal heart sounds.  Exam reveals no friction rub.   No murmur heard. Pulses:      Radial pulses are 2+ on the right side, and 2+ on the left side.  Pulmonary/Chest: Effort normal and breath sounds normal. No respiratory distress. She has no wheezes. She has no rales.  Patient is able to speak in full sentences without difficulty  Negative use of accessory muscles Negative stridor   Musculoskeletal: Normal range of motion.  Full ROM to upper and lower extremities without difficulty noted, negative ataxia noted.  Lymphadenopathy:    She has no cervical adenopathy.  Neurological: She is alert and oriented to person, place, and time. No cranial nerve deficit. She exhibits normal muscle tone. Coordination normal.  Cranial nerves III-XII grossly intact Strength 5+/5+ to upper and lower extremities bilaterally with resistance applied, equal distribution noted Equal grip strength  Negative facial drooping Negative slurred speech  Negative aphasia  Skin: Skin is warm and dry. No rash noted. She is not diaphoretic. No erythema.  Psychiatric: She has a normal mood and affect. Her behavior is normal.    ED Course  Procedures (including critical care time) DIAGNOSTIC STUDIES: Oxygen Saturation is 99% on RA, normal by my interpretation.   COORDINATION OF CARE: 10:17 AM- Will order labs and CT of soft tissue of the neck. Pt verbalizes understanding and agrees to plan.  10:31 AM This provider spoke with the  patient's primary care provider, Dr. Maurice Small. As per physician, reported that patient was first seen and assessed on 03/01/2014 regarding throat/neck pain. As per physician, reported that patient was complaining about the discomfort being described as a "baseball" was in her throat with radiation to her left ear. Dr. Justin Mend reported that on examination there was shotty adenopathy, but for the most part the exam was unremarkable. Stated that her blood work was negative. Reported that she believes that this may be a globus sensation. Reported that patient was then discharged with Allegra and a Z-Pak for possible allergic rhinitis. Reported that patient called back on 03/07/2014 stated that she needed more antibiotics are needed. Reported that patient is unable to swallow medications, but the pain was getting worse. Patient seen and assessed by nurse practitioner at this time, prednisone, Tussinex, or treatments are administered with a nebulizer treatment with minimal relief. Reported that patient called on 04/12/2014 regarding ongoing discomfort. Stated that she is unable to swallow. Patient reported that the pain is not getting any better.  Stated she was seen and assessed by oral surgeon who extracted a tooth-suspicion to be for possible tooth infection. Imaging recommended did not perform - ultrasound. As per primary care provider, agreed to plan of labs and CT soft tissue-this provider will consult with primary care provider as per plan once labs and imaging have returned.  2:30 PM This provider spoke with Dr. Simeon Craft, ENT physician - discussed case, labs and imaging in great detail. Physician reported that he will, and assessed the patient as recommended patient to be taken to the OR for biopsy to be performed.  3:25 PM- Dr. Simeon Craft at pt bedside.   Medications  sodium chloride 0.9 % bolus 1,000 mL (0 mLs Intravenous Stopped 04/14/14 1523)  iohexol (OMNIPAQUE) 300 MG/ML solution 75 mL (75 mLs Intravenous  Contrast Given 04/14/14 1211)    Labs Review Labs Reviewed  CBC WITH DIFFERENTIAL - Abnormal; Notable for the following:    WBC 12.8 (*)    Hemoglobin 15.5 (*)    Neutro Abs 8.1 (*)    Monocytes Relative 13 (*)    Monocytes Absolute 1.6 (*)    All other components within normal limits  COMPREHENSIVE METABOLIC PANEL - Abnormal; Notable for the following:    Glucose, Bld 109 (*)    GFR calc non Af Amer 73 (*)    GFR calc Af Amer 85 (*)    All other components within normal limits  RAPID STREP SCREEN  CULTURE, GROUP A STREP  TROPONIN I    Imaging Review Dg Chest 2 View  04/14/2014   CLINICAL DATA:  Sore throat and weakness  EXAM: CHEST  2 VIEW  COMPARISON:  11/21/2009  FINDINGS: Postsurgical changes are noted in the left shoulder and cervical spine. The cardiac shadow is stable. The lung for within normal limits no acute bony abnormality is seen.  IMPRESSION: No active cardiopulmonary disease.   Electronically Signed   By: Inez Catalina M.D.   On: 04/14/2014 12:35   Ct Soft Tissue Neck W Contrast  04/14/2014   CLINICAL DATA:  throat pain  EXAM: CT NECK WITH CONTRAST  TECHNIQUE: Multidetector CT imaging of the neck was performed using the standard protocol following the bolus administration of intravenous contrast.  CONTRAST:  69mL OMNIPAQUE IOHEXOL 300 MG/ML  SOLN  COMPARISON:  C-spine CT dated 02/24/2014, 08/19/2009, and 04/05/2004.  FINDINGS: The skull base is unremarkable.  A diffuse enhancing ill-defined mass extending from the base of the tongue, primarily in the vallecula extending into the posterior aspect of the epiglottis with diffuse thickening and enhancement. This finding is appreciated on images 48 through 59 and series 2 and 58 series 6. This area grossly measures 3.2 x 2.2 cm. The airway is patent.  Subcentimeter lymph nodes are appreciated within the anterior and posterior surgical cervical chains most prominent in the carotid space regions 9 mm on the right and 7 mm on the  left.  The skullbase is unremarkable.  The opacified vascular structures are unremarkable. No further neck masses, free fluid, loculated fluid collections.  The parotids and submandibular glands are symmetric without enhancement normal attenuation abnormalities.  The spaces of the neck are maintained.  The lung apices are unremarkable.  Postsurgical changes within the cervical spine with no gross osseous abnormalities.  IMPRESSION: Enhancing a amorphous mass primarily involving the vallecula with extension into the epiglottis. These findings are consistent with neoplastic disease until proven otherwise. Surgical consultation recommended. These results were called by telephone at the time of interpretation on  04/14/2014 at 12:40 PM to Dr. Jamse Mead , who verbally acknowledged these results.   Electronically Signed   By: Margaree Mackintosh M.D.   On: 04/14/2014 12:44     EKG Interpretation None      MDM   Final diagnoses:  Mass of throat  Neoplastic disease    Medications  sodium chloride 0.9 % bolus 1,000 mL (0 mLs Intravenous Stopped 04/14/14 1523)  iohexol (OMNIPAQUE) 300 MG/ML solution 75 mL (75 mLs Intravenous Contrast Given 04/14/14 1211)   Filed Vitals:   04/14/14 0937 04/14/14 1112  BP: 129/87 145/89  Pulse: 97 82  Temp: 98.2 F (36.8 C)   TempSrc: Oral   Resp: 18 18  SpO2: 99% 100%    I personally performed the services described in this documentation, which was scribed in my presence. The recorded information has been reviewed and is accurate.  EKG noted normal sinus rhythm with a heart rate of 90 beats per minute. Troponin negative elevation. CBC noted mildly elevated white blood cell count 12.8 with mild elevated neutrophils of 8.1. Hemoglobin appears to be elevated at 15.5 - possible dehydration. CMP negative findings-BUN and creatinine normal limits. Negative elevated AST, ALT, alkaline phosphatase, bilirubin. Rapid strep negative. Chest x-ray negative for acute cardia  pulmonary disease. CT soft tissue of neck with contrast noted enhancing amorphous mass primarily involving the vallecula with extension into the epiglottis-findings consistent with neoplastic disease until proven otherwise. Surgical consultation recommended. Mass measures approximately 3.2 x 2.2 cm. The airway is patent. This provider consulted with Dr. Simeon Craft - ENT physician - as per physician reported that he will come and assess the patient and take the patient to the OR for biopsy.   Jamse Mead, PA-C 04/14/14 1849

## 2014-04-14 NOTE — Op Note (Signed)
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  ESTIMATED BLOOD LOSS: less than 10 mL.  DRAINS: none SPECIMENS: vallecular mass for frozen and permanent, positive for squamous cell carcinoma on frozen section INDICATIONS: The patient is a 53yo with a history of smoking (15 pack year) and 4 months of weight loss, dysphagia, and sore throat. Neck Ct showed vallecular mass. DESCRIPTION OF OPERATION: The patient was brought to the operating room and was placed in the supine position and was placed under general endotracheal anesthesia by anesthesiology. She was a grade I view with the glidescope.  Direct laryngoscopy was performed using the anterior commissure and then the Dedo laryngoscopes. She was a grade I view. The piriform sinuses and tonsillar pillars were grossly normal but the lower tongue base, vallecula, and lingual surface of the epiglottis showed a fungating irregular mass. I sent generous biopsies for frozen and permanent. Pathology confirmed squamous cell carcinoma on frozen section. The false vocal folds and true vocal folds showed no masses or lesions and the subglottic larynx was free of tumor. I obtained meticulous hemostasis on the vallecula and epiglottis using the extended tip Bovie and topical 1:1000 epinephrine pledgets.  Rigid esophagoscopy was performed using the rigid esophagoscope. This demonstrated normal cervical esophageal mucosa.   Once hemostasis was noted the patient was turned back to anesthesia and awakened from anesthesia and extubated without difficulty. The patient tolerated the  procedure well with no immediate complications and was taken to the postoperative recovery area in good condition.   Dr. Ruby Cola was present and performed the entire procedure. 04/14/2014  6:17 PM Ruby Cola

## 2014-04-14 NOTE — ED Notes (Signed)
Patient states started getting a sore throat x 4 months.  Patient states that she went to her doctor and they told her that acid reflux was coming up into esophagus at night and it was messing up her throat.   Patient states that her doctor wanted to do an ultrasound, but she declined at the time.  Patient states she is supposed to be getting scheduled for neck surgery (plate and spurs).  Patient claims L ear hurts also.

## 2014-04-15 NOTE — Discharge Summary (Signed)
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 Simeon Craft [facility] in 5-7 days, advance diet as tolerated, Rx on chart for prednisolone, zofran, hydrocodone, clindamycin  Hospital Course: taken to OR from emergency room for vallecular mass. Frozen section on DL with biopsy showed squamous cell carcinoma. Did well post-op and discharged in good condition. Will refer to [facility] Neck surgery and [facility] as an outpatient for definitive surgical vs. Organ preservation therapy.  Ruby Cola [id number] 04/14/2014

## 2014-04-16 LAB — CULTURE, GROUP A STREP

## 2014-04-17 ENCOUNTER — Encounter (HOSPITAL_COMMUNITY): Payer: Self-pay | Admitting: Otolaryngology

## 2014-04-17 NOTE — ED Provider Notes (Signed)
Medical screening examination/treatment/procedure(s) were performed by non-physician practitioner and as supervising physician I was immediately available for consultation/collaboration.   EKG Interpretation   Date/Time:  Friday April 14 2014 10:40:25 EDT Ventricular Rate:  90 PR Interval:  126 QRS Duration: 84 QT Interval:  370 QTC Calculation: 452 R Axis:   5 Text Interpretation:  Normal sinus rhythm Normal ECG ED PHYSICIAN  INTERPRETATION AVAILABLE IN [facility] Confirmed by TEST, Record  ([id number]) on 04/16/2014 1:01:21 PM        Orpah Greek, MD 04/17/14 1541

## 2014-04-18 NOTE — Progress Notes (Signed)
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  CT 04/14/14 : A diffuse enhancing ill-defined mass extending from the base of the  tongue, primarily in the vallecula extending into the posterior  aspect of the epiglottis with diffuse thickening and enhancement.  This finding is appreciated on images 48 through 59 and series 2 and  58 series 6. This area grossly measures 3.2 x 2.2 cm. The airway is  patent.  Subcentimeter lymph nodes are appreciated within the anterior and  posterior surgical cervical chains most prominent in the carotid  space regions 9 mm on the right and 7 mm on the left.   Biopsies of Pharynx (if applicable) revealed:   04/14/14  Diagnosis 1. Pharynx, biopsy, vallecula mass - INVASIVE SQUAMOUS CELL CARCINOMA. 2. Pharynx, biopsy, vallecula mass - INVASIVE SQUAMOUS CELL CARCINOMA  Nutrition Status:  Weight changes: Has gained 2 lbs since 04/17/14.  Fluids on past Friday   Swallowing status:Dysphagia/Odonyphagia  Plans, if any, for PEG tube:  Tobacco/Marijuana/Snuff/ETOH use: 0.5 ppd x 30 years, No Alcohol, No Illicit Drug use  Past/Anticipated interventions by otolaryngology, if any: Dr. Ruby Cola: Bisopy of the Pharynx  Past/Anticipated interventions by medical oncology, if any: Appointment with Dr. Heath Lark on 04/19/14  Referrals yet, to any of the following?  Social Work?  Dentistry? Referral to Dr. Enrique Sack  Swallowing therapy?   Nutrition? [facility] [place]  Med/Onc? 04/19/14  PEG placement? ???  SAFETY ISSUES:  Prior radiation?No  Pacemaker/ICD?No  Possible current pregnancy? No  Is the patient on methotrexate?NO  Current Complaints / other details:States she is depressed.   PET Scan will be scheduled

## 2014-04-19 ENCOUNTER — Encounter: Payer: Self-pay | Admitting: *Deleted

## 2014-04-19 ENCOUNTER — Ambulatory Visit
Admission: RE | Admit: 2014-04-19 | Discharge: 2014-04-19 | Disposition: A | Discharge status: Home / Self Care | Payer: BC Managed Care – PPO | Source: Ambulatory Visit | Attending: Radiation Oncology | Admitting: Radiation Oncology

## 2014-04-19 ENCOUNTER — Telehealth: Payer: Self-pay | Admitting: Hematology and Oncology

## 2014-04-19 ENCOUNTER — Ambulatory Visit: Payer: BC Managed Care – PPO | Admitting: Nutrition

## 2014-04-19 ENCOUNTER — Ambulatory Visit (HOSPITAL_BASED_OUTPATIENT_CLINIC_OR_DEPARTMENT_OTHER): Payer: BC Managed Care – PPO | Admitting: Hematology and Oncology

## 2014-04-19 ENCOUNTER — Encounter: Payer: Self-pay | Admitting: Hematology and Oncology

## 2014-04-19 ENCOUNTER — Ambulatory Visit: Payer: BC Managed Care – PPO

## 2014-04-19 VITALS — BP 153/93 | HR 86 | Temp 97.9°F | Resp 18 | Ht 64.0 in | Wt 131.1 lb

## 2014-04-19 DIAGNOSIS — F411 Generalized anxiety disorder: Secondary | ICD-10-CM | POA: Diagnosis not present

## 2014-04-19 DIAGNOSIS — Z930 Tracheostomy status: Secondary | ICD-10-CM | POA: Insufficient documentation

## 2014-04-19 DIAGNOSIS — Z510 Encounter for antineoplastic radiation therapy: Secondary | ICD-10-CM | POA: Insufficient documentation

## 2014-04-19 DIAGNOSIS — IMO0002 Reserved for concepts with insufficient information to code with codable children: Secondary | ICD-10-CM | POA: Diagnosis not present

## 2014-04-19 DIAGNOSIS — Z90710 Acquired absence of both cervix and uterus: Secondary | ICD-10-CM | POA: Diagnosis not present

## 2014-04-19 DIAGNOSIS — M542 Cervicalgia: Secondary | ICD-10-CM

## 2014-04-19 DIAGNOSIS — F17200 Nicotine dependence, unspecified, uncomplicated: Secondary | ICD-10-CM | POA: Insufficient documentation

## 2014-04-19 DIAGNOSIS — C100 Malignant neoplasm of vallecula: Secondary | ICD-10-CM

## 2014-04-19 DIAGNOSIS — R070 Pain in throat: Secondary | ICD-10-CM

## 2014-04-19 DIAGNOSIS — Z431 Encounter for attention to gastrostomy: Secondary | ICD-10-CM | POA: Insufficient documentation

## 2014-04-19 DIAGNOSIS — M1990 Unspecified osteoarthritis, unspecified site: Secondary | ICD-10-CM

## 2014-04-19 DIAGNOSIS — C109 Malignant neoplasm of oropharynx, unspecified: Secondary | ICD-10-CM

## 2014-04-19 DIAGNOSIS — C321 Malignant neoplasm of supraglottis: Secondary | ICD-10-CM

## 2014-04-19 DIAGNOSIS — M47812 Spondylosis without myelopathy or radiculopathy, cervical region: Secondary | ICD-10-CM

## 2014-04-19 DIAGNOSIS — R1310 Dysphagia, unspecified: Secondary | ICD-10-CM | POA: Diagnosis not present

## 2014-04-19 DIAGNOSIS — Z720 Tobacco use: Secondary | ICD-10-CM

## 2014-04-19 DIAGNOSIS — G8929 Other chronic pain: Secondary | ICD-10-CM

## 2014-04-19 DIAGNOSIS — C320 Malignant neoplasm of glottis: Secondary | ICD-10-CM

## 2014-04-19 DIAGNOSIS — M549 Dorsalgia, unspecified: Secondary | ICD-10-CM

## 2014-04-19 HISTORY — DX: Pain in throat: R07.0

## 2014-04-19 MED ORDER — MORPHINE SULFATE (CONCENTRATE) 20 MG/ML PO SOLN: 10.0000 mg | ORAL | Status: DC | PRN

## 2014-04-19 NOTE — Assessment & Plan Note (Signed)
I spent some time counseling the patient the importance of tobacco cessation. she is currently attempting to quit on her own  I gave her patient education handout and encouraged her to sign up for smoking cessation class.  

## 2014-04-19 NOTE — Progress Notes (Signed)
[facility] Psychosocial Distress Screening Clinical Social Work  Clinical Social Work was referred by distress screening protocol.  The patient scored a 6 on the Psychosocial Distress Thermometer which indicates moderate distress. Clinical Social Worker met with Stacey Carroll and her son at [facility] to assess for distress and other psychosocial needs. Stacey Carroll very tearful throughout meeting with CSW. She shared that she has had issues with major depression for about two years and has been seeing Dr. Eino Farber at [place] for assistance. She sees Dr. Ysidro Evert about every two months and plans to notify her of her new cancer diagnosis. Stacey Carroll reports to take several medications to assist her with her depression, but she has not been able to swallow for several months and has not been able to take her medications. CSW shared this info with clinic team, as Stacey Carroll could benefit from proper medication for her depression. She reports she also sees counselors and has a good support network. Stacey Carroll has good support through work at [employer] and should be able to take [employer] as needed. CSW explained help available with this process. CSW explained role of CSW team and resources of Stacey Carroll and [place]. Stacey Carroll reports she feels much better after seeing the team today and being aware of the additional support available to her.   ONCBCN DISTRESS SCREENING 04/19/2014  Screening Type Initial Screening  Elta [place] the number that describes how much distress you have been experiencing in the past week 6  Practical problem type Work/school;Food  Emotional problem type Depression;Nervousness/Anxiety;Adjusting to illness;Feeling hopeless  Physical Problem type Pain;Getting around;Breathing;Mouth sores/swallowing;Talking  Physician notified of physical symptoms Yes  Referral to clinical social work Yes  Referral to support programs Yes    Clinical Social Worker follow up needed:  Stacey Carroll aware of CSW support and resources and plans to  seek follow up as needed.   If yes, follow up plan:   Loren Racer, [place] Worker [place] [place] [place] for [place] Wednesday, Thursday and Friday Phone: [phone] Fax: [phone]

## 2014-04-19 NOTE — Assessment & Plan Note (Signed)
I gave her a prescription for liquid morphine sulfate for pain.

## 2014-04-19 NOTE — Progress Notes (Signed)
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 female who presented with a 4 month history of dysphagia, odynophagia, left otalgia, and 9lb weight loss. Her oral surgeon removed a left mandibular molar with no change in her symptoms. She underwent a CT scan on 04/14/14 which was reviewed at our tumor board this AM.  It revealed an amorphous mass primarily involving the vallecula with extension into the epiglottis, 3.2 x 2.2 cm in size.  These are bilateral nodes that look suspicious but are not pathologically enlarged.  PET is being scheduled for further staging.  Biopsy of the vallecula on 7-10 by Dr Simeon Craft revealed invasive squamous cell carcinoma.  p16 pending. She has smoked 0.5 ppd x 30 years, denies ETOH abuse.  She reports she has been living a liquid diet over the past month. She cannot swallow her pills.  She received IV fluids last week. She reports significant distress, exacerbated by inability to swallow her psychiatric medications. She has chronic neck pain and back pain and has requiredmultiple surgeries due to a herniated discs. Future surgeries are "on hold" for her C spine due to her cancer diagnosis.   PREVIOUS RADIATION THERAPY: No  PAST MEDICAL HISTORY:  has a past medical history of Visual disturbance (03/11/2013); Dyslipidemia; Headache(784.0); Depression; Endometriosis; Hypertension; Hypercholesterolemia; Anxiety; Scoliosis; Arthritis; and Throat pain (04/19/2014).    PAST SURGICAL HISTORY: Past Surgical History  Procedure Laterality Date  . Cervical laminectomy  2007 (?), 2011  . Shoulder surgery Left   . Abdominal  hysterectomy  1987  . Laryngoscopy and esophagoscopy N/A 04/14/2014    Procedure: DIRECT LARYNGOSCOPY WITH BIOPSY  AND ESOPHAGOSCOPY;  Surgeon: Ruby Cola, MD;  Location: [facility] OR;  Service: ENT;  Laterality: N/A;    FAMILY HISTORY: family history includes Cancer in her father and mother; Emphysema in her mother; Heart disease in her father.  SOCIAL HISTORY:  reports that she has been smoking Cigarettes.  She has a 19.5 pack-year smoking history. She has never used smokeless tobacco. She reports that she does not drink alcohol or use illicit drugs.  ALLERGIES: Penicillins  MEDICATIONS:  Current Outpatient Prescriptions  Medication Sig Dispense Refill  . amphetamine-dextroamphetamine (ADDERALL XR) 20 MG 24 hr capsule       . diazepam (VALIUM) 10 MG tablet Take 10 mg by mouth every 6 (six) hours as needed for anxiety.      . fluticasone (FLONASE) 50 MCG/ACT nasal spray       . HYDROcodone-acetaminophen (HYCET) 7.5-325 mg/15 ml solution       . ipratropium-albuterol (DUONEB) 0.5-2.5 (3) MG/3ML SOLN       . morphine (ROXANOL) 20 MG/ML concentrated solution Take 0.5 mLs (10 mg total) by mouth every 2 (two) hours as needed for severe pain.  240 mL  0   No current facility-administered medications for this encounter.    REVIEW OF SYSTEMS:  Notable for that above.   PHYSICAL EXAM:   Vitals with BMI 04/19/2014  Height 5\' 4"   Weight 131 lbs 1 oz  BMI 21.3  Systolic 086  Diastolic 93  Pulse 86  Respirations 18   General: Alert  and oriented, in acute distress HEENT: Head is normocephalic. Extraocular movements are intact. Oropharynx is clear externally. Mucosa moist.  LARYNGOSCOPY DECLINED DUE TO SIGNIFICANT DISTRESS Neck: Neck is supple, no palpable cervical or supraclavicular lymphadenopathy. Heart: Regular in rate and rhythm with no murmurs, rubs, or gallops. Chest: Clear to auscultation bilaterally, with no rhonchi, wheezes, or rales. Abdomen: Soft, nontender, nondistended, with no  rigidity or guarding. Extremities: No cyanosis or edema. Lymphatics: No obvious lymphadenopathy. Skin: No concerning lesions. Musculoskeletal: symmetric strength and muscle tone throughout. Neurologic: Cranial nerves II through XII are grossly intact. No obvious focalities. Speech is fluent. Coordination is intact. Psychiatric: Judgment and insight are intact. Affect is tearful   ECOG =1  0 - Asymptomatic (Fully active, able to carry on all predisease activities without restriction)  1 - Symptomatic but completely ambulatory (Restricted in physically strenuous activity but ambulatory and able to carry out work of a light or sedentary nature. For example, light housework, office work)  2 - Symptomatic, <50% in bed during the day (Ambulatory and capable of all self care but unable to carry out any work activities. Up and about more than 50% of waking hours)  3 - Symptomatic, >50% in bed, but not bedbound (Capable of only limited self-care, confined to bed or chair 50% or more of waking hours)  4 - Bedbound (Completely disabled. Cannot carry on any self-care. Totally confined to bed or chair)  5 - Death   Eustace Pen MM, Creech RH, Tormey DC, et al. [phone]). "Toxicity and response criteria of the [facility]". [place] Oncol. 5 (6): 649-55   LABORATORY DATA:  Lab Results  Component Value Date   WBC 12.8* 04/14/2014   HGB 15.5* 04/14/2014   HCT 44.0 04/14/2014   MCV 90.7 04/14/2014   PLT 347 04/14/2014   CMP     Component Value Date/Time   NA 141 04/14/2014 1020   K 4.2 04/14/2014 1020   CL 101 04/14/2014 1020   CO2 25 04/14/2014 1020   GLUCOSE 109* 04/14/2014 1020   BUN 18 04/14/2014 1020   CREATININE 0.88 04/14/2014 1020   CALCIUM 10.0 04/14/2014 1020   PROT 7.7 04/14/2014 1020   ALBUMIN 4.0 04/14/2014 1020   AST 17 04/14/2014 1020   ALT 13 04/14/2014 1020   ALKPHOS 117 04/14/2014 1020   BILITOT 0.3 04/14/2014 1020   GFRNONAA 73* 04/14/2014 1020   GFRAA 85* 04/14/2014  1020         RADIOGRAPHY: Dg Chest 2 View  04/14/2014   CLINICAL DATA:  Sore throat and weakness  EXAM: CHEST  2 VIEW  COMPARISON:  11/21/2009  FINDINGS: Postsurgical changes are noted in the left shoulder and cervical spine. The cardiac shadow is stable. The lung for within normal limits no acute bony abnormality is seen.  IMPRESSION: No active cardiopulmonary disease.   Electronically Signed   By: Inez Catalina M.D.   On: 04/14/2014 12:35   Ct Soft Tissue Neck W Contrast  04/14/2014   CLINICAL DATA:  throat pain  EXAM: CT NECK WITH CONTRAST  TECHNIQUE: Multidetector CT imaging of the neck was performed using the standard protocol following the bolus administration of intravenous contrast.  CONTRAST:  22mL OMNIPAQUE IOHEXOL 300 MG/ML  SOLN  COMPARISON:  C-spine CT dated 02/24/2014, 08/19/2009, and 04/05/2004.  FINDINGS: The skull base is unremarkable.  A diffuse enhancing ill-defined mass extending from the base of the tongue, primarily in the vallecula extending into the posterior aspect of the  epiglottis with diffuse thickening and enhancement. This finding is appreciated on images 48 through 59 and series 2 and 58 series 6. This area grossly measures 3.2 x 2.2 cm. The airway is patent.  Subcentimeter lymph nodes are appreciated within the anterior and posterior surgical cervical chains most prominent in the carotid space regions 9 mm on the right and 7 mm on the left.  The skullbase is unremarkable.  The opacified vascular structures are unremarkable. No further neck masses, free fluid, loculated fluid collections.  The parotids and submandibular glands are symmetric without enhancement normal attenuation abnormalities.  The spaces of the neck are maintained.  The lung apices are unremarkable.  Postsurgical changes within the cervical spine with no gross osseous abnormalities.  IMPRESSION: Enhancing a amorphous mass primarily involving the vallecula with extension into the epiglottis. These findings are  consistent with neoplastic disease until proven otherwise. Surgical consultation recommended. These results were called by telephone at the time of interpretation on 04/14/2014 at 12:40 PM to Dr. Jamse Mead , who verbally acknowledged these results.   Electronically Signed   By: Margaree Mackintosh M.D.   On: 04/14/2014 12:44   Mr Lumbar Spine Wo Contrast  03/25/2014   CLINICAL DATA:  Low back pain  EXAM: MRI LUMBAR SPINE WITHOUT CONTRAST  TECHNIQUE: Multiplanar, multisequence MR imaging of the lumbar spine was performed. No intravenous contrast was administered.  COMPARISON:  None.  FINDINGS: Normal lumbar alignment. Negative for fracture or mass. Hemangioma L1 vertebral body. Conus medullaris is normal and terminates at T12-L1.  L1-2:  Negative  L2-3:  Negative  L3-4: Disc degeneration with disc space narrowing and diffuse bulging of the disc. Left foraminal and left lateral disc protrusion with mild displacement of the left L3 nerve root lateral to the foramen. Mild spinal stenosis.  L4-5: Moderate disc degeneration with disc space narrowing and spondylosis. Posterior decompression on the right. Diffuse disc bulging and spurring with mild spinal stenosis. Mild facet degeneration.  L5-S1:  Negative  IMPRESSION: At L3-4 there is diffuse disc bulging as well as a left foraminal and left lateral disc protrusion. Mild spinal stenosis.  Prior posterior decompression L4-5. There is spondylosis and disc bulging with mild spinal stenosis.   Electronically Signed   By: Franchot Gallo M.D.   On: 03/25/2014 14:51      IMPRESSION/PLAN: This is a delightful 53 year old woman with probable T3N2cM0 squamous cell carcinoma of the vallecula, PET pending, HPV status pending,  negative ETOH abuse /  Positive smoking history. She will likely be an excellent candidate for  radiotherapy and concurrent chemotherapy as discussed at today's tumor board. But, PET is pending for final stage.  Plan is as below:   1) She has seen  med/onc today in multidisciplinary clinic to discuss chemotherapy - disposition is pending PET, and evaluation by ENT at [facility] (I am not sure which MD she will see).  1a) PET being scheduled.  1b) She is not sure which MD she will see for a second ENT opinion at [facility] next week.  I told her there is a good likelihood that surgery will not be recommended due to heightened risk of persistent/worsening dysphagia.  But, the physicians there are very respectable and I look forward to their opinion.  2) Referral to dentistry for dental evaluation/extractions if needed - assuming she will receive RT  3) Distress: Will refer to social work for social support - to be seen today in multidisciplinary clinic. Also, Gayleen Orem, RN will call pharmacy  to see what meds can be converted to liquid form to help her anxiety.  4) Weight loss: Will refer to nutrition for nutrition support - to be seen today in multidisciplinary clinic  5) Medical Oncology may eventually refer to surgery for PEG tube placement. This is depending on chemotherapy plans, if any.   6) Will refer to swallowing therapy for dysphagia     7) If PET rules out distant mets, and otolaryngologist at [facility] agrees that ChRT is the best approach, will simulate her once she is cleared by dentistry. Anticipate 7 weeks of RT - 70 Gy in 35 fractions.    8) While a proactive referral to PT could be considered (risk of neck edema) - I will hold off on this as she feels overwhelmed with appointments.  9) The patient continues to use tobacco. The patient was counseled to stop using tobacco and was offered pharmacotherapy and further counseling to help with this. She knows this could compromise her tolerance of treatment and chance of cure. The patient declined pharmacotherapy and further counseling at this time.   It was a pleasure meeting the patient today. We discussed the risks, benefits, and side effects of radiotherapy. We talked in detail about  acute and late effects. She understands that some of the most bothersome acute effects will be significant soreness of the mouth and throat, changes in taste, changes in salivary function, skin irritation, hair loss, dehydration, weight loss and fatigue. We talked about late effects which include but are not necessarily limited to dysphagia, hypothyroidism, nerve injury, spinal cord injury, dry mouth, trismus, and neck edema. No guarantees of treatment were given. A consent form was signed and placed in the patient's medical record. The patient is enthusiastic about proceeding with treatment if ultimately needed in our department. I look forward to participating in the patient's care.   __________________________________________   Eppie Gibson, MD

## 2014-04-19 NOTE — Progress Notes (Signed)
[place] NOTE  Patient Care Team: Jonathon Bellows, MD as PCP - General (Family Medicine) Heather [place], OD as Consulting Physician (Optometry)  CHIEF COMPLAINTS/PURPOSE OF CONSULTATION:  Squamous cell carcinoma of the vallecula and base of tongue  HISTORY OF PRESENTING ILLNESS:  Stacey Carroll 53 y.o. female is here because of newly diagnosed squamous cell carcinoma. According to the patient, the first initial presentation was due to sensation of sore throat for the last 4-6 months. She described her pain as severe and rated her pain is 7/10 pain. The patient had significant dysphagia due to painful swallowing. She has been living a liquid diet over the past month. she denies any hearing deficit, difficulties with chewing food, changes in the quality of her voice. She has lost about 9 pounds over the past month. Review of her records extensively and summarize her oncologic history is as follows: Oncology History   Oropharyngeal cancer   Primary site: Pharynx - Oropharynx   Staging method: AJCC 7th Edition   Clinical: (T3, NX)   Summary: (T3, NX)       Oropharyngeal cancer   04/14/2014 Imaging CT scan of the neck showed oropharyngeal mass.   04/14/2014 Pathology Biopsy [id number] of the vallecula came back positive for squamous cell carcinoma, HPV status pending.   04/14/2014 Surgery Laryngoscopy of the lower tongue base, vallecula, and lingual surface of the epiglottis showed a fungating irregular mass   She has chronic neck pain and back pain and requiring multiple surgeries due to a herniated discs. She was unable to return to work due to her disability. 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 She has chronic smoking history, currently attempting to quit smoking.  MEDICAL HISTORY:  Past Medical History  Diagnosis Date  . Visual disturbance 03/11/2013  . Dyslipidemia   . Headache(784.0)   . Depression   . Endometriosis   .  Hypertension   . Hypercholesterolemia   . Anxiety   . Scoliosis   . Arthritis   . Throat pain 04/19/2014    SURGICAL HISTORY: Past Surgical History  Procedure Laterality Date  . Cervical laminectomy  2007 (?), 2011  . Shoulder surgery Left   . Abdominal hysterectomy  1987  . Laryngoscopy and esophagoscopy N/A 04/14/2014    Procedure: DIRECT LARYNGOSCOPY WITH BIOPSY  AND ESOPHAGOSCOPY;  Surgeon: Ruby Cola, MD;  Location: [place];  Service: ENT;  Laterality: N/A;    SOCIAL HISTORY: History   Social History  . Marital Status: Single    Spouse Name: N/A    Number of Children: N/A  . Years of Education: N/A   Occupational History  . Not on file.   Social History Main Topics  . Smoking status: Current Every Day Smoker -- 0.50 packs/day for 39 years    Types: Cigarettes  . Smokeless tobacco: Never Used  . Alcohol Use: No     Comment: occasional  . Drug Use: No  . Sexual Activity: Not on file   Other Topics Concern  . Not on file   Social History Narrative  . No narrative on file    FAMILY HISTORY: Family History  Problem Relation Age of Onset  . Emphysema Mother   . Cancer Mother     throat ca  . Cancer Father     prostate ca  . Heart disease Father     ALLERGIES:  is allergic to penicillins.  MEDICATIONS:  Current Outpatient Prescriptions  Medication  Sig Dispense Refill  . diazepam (VALIUM) 10 MG tablet Take 10 mg by mouth every 6 (six) hours as needed for anxiety.      Marland Kitchen amphetamine-dextroamphetamine (ADDERALL XR) 20 MG 24 hr capsule       . fluticasone (FLONASE) 50 MCG/ACT nasal spray       . HYDROcodone-acetaminophen (HYCET) 7.5-325 mg/15 ml solution       . ipratropium-albuterol (DUONEB) 0.5-2.5 (3) MG/3ML SOLN       . morphine (ROXANOL) 20 MG/ML concentrated solution Take 0.5 mLs (10 mg total) by mouth every 2 (two) hours as needed for severe pain.  240 mL  0   No current facility-administered medications for this visit.    REVIEW OF SYSTEMS:    Constitutional: Denies fevers, chills or abnormal night sweats Eyes: Denies blurriness of vision, double vision or watery eyes Respiratory: Denies cough, dyspnea or wheezes Cardiovascular: Denies palpitation, chest discomfort or lower extremity swelling Gastrointestinal:  Denies nausea, heartburn or change in bowel habits Skin: Denies abnormal skin rashes Lymphatics: Denies new lymphadenopathy or easy bruising Neurological:Denies numbness, tingling or new weaknesses Behavioral/Psych: Mood is stable, no new changes  All other systems were reviewed with the patient and are negative.  PHYSICAL EXAMINATION: ECOG PERFORMANCE STATUS: 1 - Symptomatic but completely ambulatory  Filed Vitals:   04/19/14 1234  BP: 153/93  Pulse: 86  Temp: 97.9 F (36.6 C)  Resp: 18   Filed Weights   04/19/14 1234  Weight: 131 lb 1 oz (59.45 kg)    GENERAL:alert, no distress and comfortable. She looks thin but not cachectic SKIN: skin color, texture, turgor are normal, no rashes or significant lesions EYES: normal, conjunctiva are pink and non-injected, sclera clear OROPHARYNX:no exudate, no erythema and lips, buccal mucosa, and tongue normal  NECK: supple, thyroid normal size, non-tender, without nodularity LYMPH:  no palpable lymphadenopathy in the cervical, axillary or inguinal LUNGS: clear to auscultation and percussion with normal breathing effort HEART: regular rate & rhythm and no murmurs and no lower extremity edema ABDOMEN:abdomen soft, non-tender and normal bowel sounds Musculoskeletal:no cyanosis of digits and no clubbing  PSYCH: alert & oriented x 3 with fluent speech NEURO: no focal motor/sensory deficits  LABORATORY DATA:  I have reviewed the data as listed Lab Results  Component Value Date   WBC 12.8* 04/14/2014   HGB 15.5* 04/14/2014   HCT 44.0 04/14/2014   MCV 90.7 04/14/2014   PLT 347 04/14/2014   Lab Results  Component Value Date   NA 141 04/14/2014   K 4.2 04/14/2014   CL 101  04/14/2014   CO2 25 04/14/2014    RADIOGRAPHIC STUDIES: I have personally reviewed the radiological images as listed and agreed with the findings in the report.  Ct Soft Tissue Neck W Contrast  04/14/2014   CLINICAL DATA:  throat pain  EXAM: CT NECK WITH CONTRAST  TECHNIQUE: Multidetector CT imaging of the neck was performed using the standard protocol following the bolus administration of intravenous contrast.  CONTRAST:  50mL OMNIPAQUE IOHEXOL 300 MG/ML  SOLN  COMPARISON:  C-spine CT dated 02/24/2014, 08/19/2009, and 04/05/2004.  FINDINGS: The skull base is unremarkable.  A diffuse enhancing ill-defined mass extending from the base of the tongue, primarily in the vallecula extending into the posterior aspect of the epiglottis with diffuse thickening and enhancement. This finding is appreciated on images 48 through 59 and series 2 and 58 series 6. This area grossly measures 3.2 x 2.2 cm. The airway is patent.  Subcentimeter  lymph nodes are appreciated within the anterior and posterior surgical cervical chains most prominent in the carotid space regions 9 mm on the right and 7 mm on the left.  The skullbase is unremarkable.  The opacified vascular structures are unremarkable. No further neck masses, free fluid, loculated fluid collections.  The parotids and submandibular glands are symmetric without enhancement normal attenuation abnormalities.  The spaces of the neck are maintained.  The lung apices are unremarkable.  Postsurgical changes within the cervical spine with no gross osseous abnormalities.  IMPRESSION: Enhancing a amorphous mass primarily involving the vallecula with extension into the epiglottis. These findings are consistent with neoplastic disease until proven otherwise. Surgical consultation recommended. These results were called by telephone at the time of interpretation on 04/14/2014 at 12:40 PM to Dr. Jamse Mead , who verbally acknowledged these results.   Electronically Signed   By:  Margaree Mackintosh M.D.   On: 04/14/2014 12:44   ASSESSMENT:  Newly diagnosed squamous cell carcinoma of the Head & Neck, HPV N/A  PLAN:  Oropharyngeal cancer Stage of the disease is to be determined, a PET/CT scan has been ordered.   Prognosis would depend on the results for the PET/CT scan, to be discussed and reviewed in the next visit.   Treatment options would include chemotherapy only, radiation only or chemotherapy in combination with radiation therapy.  Surgery might be an option if she had no evidence of lymph node involvement. With the dysphagia and weight loss, I recommend dietitian consult. The patient declined. She has seen a dietitian recently. I also explained to her the importance of getting our dentist evaluate her teeth but the patient had recently visited with the dentist. She declined the consultation.  She has an appointment to see surgeon at [facility].  Throat pain I gave her a prescription for liquid morphine sulfate for pain.    Tobacco abuse I spent some time counseling the patient the importance of tobacco cessation. she is currently attempting to quit on her own  I gave her patient education handout and encouraged her to sign up for smoking cessation class.   DJD (degenerative joint disease) of cervical spine She wanted surgery for her degenerative joint disease. Due to recent diagnosis of cancer, this surgery will need to be deferred until her cancer is treated.    I have not made a return appointment for the patient to come back, pending results of her PET CT scan. Orders Placed This Encounter  Procedures  . NM PET Image Initial (PI) Skull Base To Thigh    Standing Status: Future     Number of Occurrences:      Standing Expiration Date: 04/19/2015    Order Specific Question:  Reason for Exam (SYMPTOM  OR DIAGNOSIS REQUIRED)    Answer:  staging oropharyngeal ca    Order Specific Question:  Is the patient pregnant?    Answer:   No    Order Specific Question:  Preferred imaging location?    Answer:  [facility]    All questions were answered. The patient knows to call the clinic with any problems, questions or concerns. I spent 40 minutes counseling the patient face to face. The total time spent in the appointment was 60 minutes and more than 50% was on counseling.     [facility], [place], MD 04/19/2014 7:59 PM

## 2014-04-19 NOTE — Progress Notes (Signed)
Checked in new pt with no financial concerns. °

## 2014-04-19 NOTE — Assessment & Plan Note (Signed)
She wanted surgery for her degenerative joint disease. Due to recent diagnosis of cancer, this surgery will need to be deferred until her cancer is treated.

## 2014-04-19 NOTE — Progress Notes (Signed)
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  She is a patient of Dr. Alvy Bimler and Dr. Isidore Moos.  Past medical history includes visual disturbance, dyslipidemia, depression, hypertension, hypercholesterolemia, anxiety, scoliosis, and arthritis.  Medications include Valium 10.5 via a, a is with, and a a he is a and Roxanol.  Labs include glucose 109 on July 10.  Height: 64 inches. Weight: 131.06 pounds. Usual body weight: 151 pounds 03/11/2013. BMI: 22.49.  Patient reports she has lost 20 pounds over the last 4 to 6 months.  She has lost 9 pounds over the last 10 days.  Patient has difficulty chewing and swallowing.  She has been consuming liquid diet.  She denies other nutrition side effects.  Nutrition diagnosis: Unintended weight loss related to oropharyngeal cancer and difficulty chewing and swallowing as evidenced by a 20 pound weight loss from usual body weight.  Intervention: Patient educated to consume small, frequent meals and snacks throughout the day, focusing on pureing foods and drinking full liquids.  Recommended patient add Carnation breakfast essentials as an oral nutrition supplement.  Encouraged patient to experiment with textures and temperatures of food.  Provided fact sheets for patient.  Questions were answered.  Teach back method used.  Contact information was given.  Monitoring, evaluation, goals: Patient will tolerate increased calories and protein to minimize further weight loss.  Next visit: To be scheduled.   **Disclaimer: This note was dictated with voice recognition software. Similar sounding words can inadvertently be transcribed and this note may contain transcription errors which may not have been corrected upon publication of note.**

## 2014-04-19 NOTE — Assessment & Plan Note (Signed)
Stage of the disease is to be determined, a PET/CT scan has been ordered.   Prognosis would depend on the results for the PET/CT scan, to be discussed and reviewed in the next visit.   Treatment options would include chemotherapy only, radiation only or chemotherapy in combination with radiation therapy.  Surgery might be an option if she had no evidence of lymph node involvement. With the dysphagia and weight loss, I recommend dietitian consult. The patient declined. She has seen a dietitian recently. I also explained to her the importance of getting our dentist evaluate her teeth but the patient had recently visited with the dentist. She declined the consultation.  She has an appointment to see surgeon at [facility].

## 2014-04-19 NOTE — Telephone Encounter (Signed)
PATIENT CALLED ON 07/14 TO CONFIRMED APPT FOR 07/15 @ 12:30 W/DR. [place]

## 2014-04-20 ENCOUNTER — Other Ambulatory Visit: Payer: Self-pay | Admitting: Radiation Oncology

## 2014-04-20 ENCOUNTER — Encounter: Payer: Self-pay | Admitting: Hematology and Oncology

## 2014-04-20 ENCOUNTER — Encounter: Payer: Self-pay | Admitting: Radiation Oncology

## 2014-04-20 DIAGNOSIS — C100 Malignant neoplasm of vallecula: Secondary | ICD-10-CM | POA: Insufficient documentation

## 2014-04-20 DIAGNOSIS — C109 Malignant neoplasm of oropharynx, unspecified: Secondary | ICD-10-CM

## 2014-04-20 NOTE — Progress Notes (Signed)
Put fmla form on nurse's desk °

## 2014-04-21 ENCOUNTER — Other Ambulatory Visit (HOSPITAL_COMMUNITY): Payer: Self-pay | Admitting: Radiation Oncology

## 2014-04-21 ENCOUNTER — Telehealth: Payer: Self-pay | Admitting: *Deleted

## 2014-04-21 DIAGNOSIS — R1310 Dysphagia, unspecified: Secondary | ICD-10-CM

## 2014-04-21 NOTE — Telephone Encounter (Signed)
Called patient to inform [facility]SS for 04-27-14- arrival time - 12:45 pm @ [facility] and visit with Garald Balding on 05-08-14 - arrival time - 1:45 pm, spoke with patient and she is aware of these appts.

## 2014-04-22 NOTE — Progress Notes (Signed)
Met with pt and her son during initial consults with Drs. Alvy Bimler and Isidore Moos.  Introduced myself as Designer, television/film set, explained my role as a member of the Care Team, provided contact information, encouraged them to contact me with questions/concerns as treatments/procedures begin.  They indicated understanding.   Initiating navigation as L1 patient (new) with this encounter.  Gayleen Orem, RN, BSN, [facility] Head & Neck Oncology Navigator [phone]

## 2014-04-24 ENCOUNTER — Ambulatory Visit (HOSPITAL_COMMUNITY): Payer: BC Managed Care – PPO

## 2014-04-25 ENCOUNTER — Ambulatory Visit (HOSPITAL_COMMUNITY): Payer: Self-pay | Admitting: Dentistry

## 2014-04-25 ENCOUNTER — Telehealth: Payer: Self-pay | Admitting: *Deleted

## 2014-04-25 ENCOUNTER — Encounter: Payer: Self-pay | Admitting: *Deleted

## 2014-04-25 ENCOUNTER — Encounter (HOSPITAL_COMMUNITY): Payer: Self-pay | Admitting: Dentistry

## 2014-04-25 VITALS — BP 137/85 | HR 61 | Temp 98.2°F

## 2014-04-25 DIAGNOSIS — K0889 Other specified disorders of teeth and supporting structures: Secondary | ICD-10-CM

## 2014-04-25 DIAGNOSIS — C100 Malignant neoplasm of vallecula: Secondary | ICD-10-CM

## 2014-04-25 DIAGNOSIS — K036 Deposits [accretions] on teeth: Secondary | ICD-10-CM

## 2014-04-25 DIAGNOSIS — IMO0002 Reserved for concepts with insufficient information to code with codable children: Secondary | ICD-10-CM

## 2014-04-25 DIAGNOSIS — K0530 Chronic periodontitis, unspecified: Secondary | ICD-10-CM

## 2014-04-25 DIAGNOSIS — K08409 Partial loss of teeth, unspecified cause, unspecified class: Secondary | ICD-10-CM

## 2014-04-25 DIAGNOSIS — M264 Malocclusion, unspecified: Secondary | ICD-10-CM

## 2014-04-25 DIAGNOSIS — Z0189 Encounter for other specified special examinations: Secondary | ICD-10-CM

## 2014-04-25 NOTE — Progress Notes (Signed)
To provide support, encouragement and care continuity, met with patient and her son prior to her eval with Dr. Enrique Sack.  I provided her assistance in completing consult form, she confirmed with me her understanding of today's 1:00 PET, I showed her son location of [facility] and explained arrival procedure.  He verbalized understanding.  Continuing navigation as L1 patient (new patient).  Gayleen Orem, RN, BSN, [facility] Head & Neck Oncology Navigator [phone]

## 2014-04-25 NOTE — Progress Notes (Signed)
DENTAL CONSULTATION  Date of Consultation:  04/25/2014 Patient Name:   Stacey Carroll Date of Birth:   [birth year] Medical Record Number: [id number]  VITALS: BP 137/85  Pulse 61  Temp(Src) 98.2 F (36.8 C) (Oral)   CHIEF COMPLAINT: Patient was referred for a medically necessary pre-chemoradiation therapy dental protocol consultation.  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 Patient is anticipated to receive chemoradiation therapy. Patient now seen as part of a medically necessary pre-chemoradiation therapy dental protocol examination to rule out dental infection that may affect the patient's systemic health as well as to prevent future complications such as osteoradionecrosis.  The patient is currently complaining of dull, achy pain involving the lower left quadrant teeth numbers 18 and 20. Patient indicates a tooth #'s 18 and 20 are loose. Patient describes pain as being constant in nature with an intensity of 6/10. This is relieved with her current morphine liquid therapy. The patient indicates that she recently had tooth #19 extracted by Dr. Lyla Son, periodontitis, with placement of bone graft and membrane. The membrane is currently visible through the oral cavity in the area of #19.  Membrane is tentatively scheduled to be removed on 05/07/2014.  The patient has been treated by Dr. Lyla Son over the past 2 years. Patient is seen every 3-4 months for "deep scaling" and periodontal maintenance procedures by patient report. Patient has not seen a general dentist for the past 4 years. Patient denies having any partial dentures.  PROBLEM LIST: Patient Active Problem List   Diagnosis Date Noted  . 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 Throat pain 04/19/2014  . 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 Tobacco abuse 04/19/2014  . DJD (degenerative joint disease) of cervical spine 04/19/2014    PMH: Past Medical  History  Diagnosis Date  . Visual disturbance 03/11/2013  . Dyslipidemia   . Headache(784.0)   . Depression   . Endometriosis   . Hypertension   . Hypercholesterolemia   . Anxiety   . Scoliosis   . Arthritis   . Throat pain 04/19/2014    PSH: Past Surgical History  Procedure Laterality Date  . Cervical laminectomy  2007 (?), 2011  . Shoulder surgery Left   . Abdominal hysterectomy  1987  . Laryngoscopy and esophagoscopy N/A 04/14/2014    Procedure: DIRECT LARYNGOSCOPY WITH BIOPSY  AND ESOPHAGOSCOPY;  Surgeon: Ruby Cola, MD;  Location: [facility];  Service: ENT;  Laterality: N/A;    ALLERGIES: Allergies  Allergen Reactions  . Penicillins     From childhood    MEDICATIONS: Current Outpatient Prescriptions  Medication Sig Dispense Refill  . diazepam (VALIUM) 10 MG tablet Take 10 mg by mouth every 6 (six) hours as needed for anxiety.      . fluticasone (FLONASE) 50 MCG/ACT nasal spray       . morphine (ROXANOL) 20 MG/ML concentrated solution Take 0.5 mLs (10 mg total) by mouth every 2 (two) hours as needed for severe pain.  240 mL  0  . PARoxetine (PAXIL) 20 MG tablet Take 20 mg by mouth daily.      Marland Kitchen amphetamine-dextroamphetamine (ADDERALL XR) 20 MG 24 hr capsule       . HYDROcodone-acetaminophen (HYCET) 7.5-325 mg/15 ml solution       . ipratropium-albuterol (DUONEB) 0.5-2.5 (3) MG/3ML SOLN        No current facility-administered medications for this visit.    LABS: Lab Results  Component Value  Date   WBC 12.8* 04/14/2014   HGB 15.5* 04/14/2014   HCT 44.0 04/14/2014   MCV 90.7 04/14/2014   PLT 347 04/14/2014      Component Value Date/Time   NA 141 04/14/2014 1020   K 4.2 04/14/2014 1020   CL 101 04/14/2014 1020   CO2 25 04/14/2014 1020   GLUCOSE 109* 04/14/2014 1020   BUN 18 04/14/2014 1020   CREATININE 0.88 04/14/2014 1020   CALCIUM 10.0 04/14/2014 1020   GFRNONAA 73* 04/14/2014 1020   GFRAA 85* 04/14/2014 1020   No results found for this basename: INR, PROTIME   No  results found for this basename: PTT    SOCIAL HISTORY: History   Social History  . Marital Status: Single    Spouse Name: N/A    Number of Children: N/A  . Years of Education: N/A   Occupational History  . Not on file.   Social History Main Topics  . Smoking status: Current Every Day Smoker -- 0.50 packs/day for 39 years    Types: Cigarettes  . Smokeless tobacco: Never Used  . Alcohol Use: Yes     Comment: occasional  . Drug Use: No  . Sexual Activity: Not on file   Other Topics Concern  . Not on file   Social History Narrative  . No narrative on file    FAMILY HISTORY: Family History  Problem Relation Age of Onset  . Emphysema Mother   . Cancer Mother     throat ca  . Cancer Father     prostate ca  . Heart disease Father      REVIEW OF SYSTEMS: Reviewed with patient and included in dental record.  DENTAL HISTORY: CHIEF COMPLAINT: Patient was referred for a medically necessary pre-chemoradiation therapy dental protocol consultation.  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 Patient is anticipated to receive chemoradiation therapy. Patient now seen as part of a medically necessary pre-chemoradiation therapy dental protocol examination to rule out dental infection that may affect the patient's systemic health as well as to prevent future complications such as osteoradionecrosis.  The patient is currently complaining of dull, achy pain involving the lower left quadrant teeth numbers 18 and 20. Patient indicates a tooth #'s 18 and 20 are loose. Patient describes pain as being constant in nature with an intensity of 6/10. This is relieved with her current morphine liquid therapy. The patient indicates that she recently had tooth #19 extracted by Dr. Lyla Son, periodontitis, with placement of bone graft and membrane. The membrane is currently visible through the oral cavity in the area of #19.   Membrane is tentatively scheduled to be removed on 05/07/2014.  The patient has been treated by Dr. Lyla Son over the past 2 years. Patient is seen every 3-4 months for "deep scaling" and periodontal maintenance procedures by patient report. Patient has not seen a general dentist for the past 4 years. Patient denies having any partial dentures.  DENTAL EXAMINATION: GENERAL: The patient is a well-developed, well-nourished female in no acute distress. HEAD AND NECK: There is no palpable submandibular lymphadenopathy, but CT scans shows possible right and left neck lymphadenopathy. The PET scan is pending. The patient denies acute TMJ symptoms. INTRAORAL EXAM: The patient has normal saliva. There is no evidence of abscess formation. There is a barrier membrane in the area of previous extracted tooth #19 consistent with previous extraction with bone grafting material placement by the  periodontist. DENTITION: The patient is missing tooth numbers 1, 16, 17, 19, and 32. PERIODONTAL: Patient has chronic periodontitis with plaque and calculus accumulations, generalized gingival recession, and tooth mobility as charted. The patient has multiple molars with furcation involvement. DENTAL CARIES/SUBOPTIMAL RESTORATIONS: There are no obvious dental caries noted. Multiple abfraction lesions are noted. ENDODONTIC: The patient is complaining of dull, achy pain that is most likely of periodontal origin. CROWN AND BRIDGE: There are no crown or bridge restorations. PROSTHODONTIC: Patient denies having any partial dentures. OCCLUSION: Patient has a poor occlusal scheme secondary to multiple missing teeth, supra-eruption and drifting of the unopposed teeth into the edentulous areas, deep overbite and overjet, and lack of replacement of missing teeth with dental prostheses.  RADIOGRAPHIC INTERPRETATION: And orthopantogram was taken and supplemented with a full series of dental radiographs. Patient is missing tooth  numbers 1, 16, 17, 19, and 32. There is moderate to severe bone loss.  There is supra-eruption and drifting of the unopposed teeth into the edentulous areas. Multiple dental restorations are noted.  ASSESSMENTS: 1. Squamous cell carcinoma of the vallecula 2. Pre-chemoradiation therapy dental protocol 3. Chronic periodontitis with bone loss 4. Accretions 5. Gingival recession 6. Tooth mobility 7. Multiple missing teeth 8. Recent dental extraction of tooth #19 with bone grafting material and membrane placement 9. Supra-eruption and drifting of the unopposed teeth into the edentulous areas 10. Deep overbite and significant overjet 11. Malocclusion but currently stable 12. Multiple abfraction lesions   PLAN/RECOMMENDATIONS: 1. I discussed the risks, benefits, and complications of various treatment options with the patient in relationship to her medical and dental conditions, anticipated chemoradiation therapy, and side effects to include xerostomia, radiation caries, trismus, mucositis, taste changes, gum and jawbone changes, and risk for infection, bleeding, and osteoradionecrosis. We discussed various treatment options to include no treatment, multiple extractions with alveoloplasty, pre-prosthetic surgery as indicated, periodontal therapy, dental restorations, root canal therapy, crown and bridge therapy, implant therapy, and replacement of missing teeth as indicated. The patient currently wishes to  defer treatment decisions until her PET scan has been obtained and definitive MEDICAL plan of care is agreed upon. The patient is leaning towards extraction of tooth numbers 18 and 20 at this time but will consider additional extractions if teeth will be in the primary field radiation therapy. Patient will then need two weeks of healing prior to start of chemoradiation therapy.  The patient did agree to proceed with impressions today for the fabrication of fluoride trays and scatter protection devices  if needed. I will also contact Dr. Lyla Son, concerning his anticipated plan of care for the patient and in particular the removal of the membrane.  2. Discussion of findings with medical team and coordination of future medical and dental care as needed.  I spent 75 minutes face to face with patient and more than 50% of time was spent in counseling and /or coordination of care.   Lenn Cal, DDS

## 2014-04-25 NOTE — Patient Instructions (Signed)

## 2014-04-26 ENCOUNTER — Ambulatory Visit (HOSPITAL_COMMUNITY)
Admission: RE | Admit: 2014-04-26 | Discharge: 2014-04-26 | Disposition: A | Discharge status: Home / Self Care | Payer: BC Managed Care – PPO | Source: Ambulatory Visit | Attending: Hematology and Oncology | Admitting: Hematology and Oncology

## 2014-04-26 ENCOUNTER — Encounter (HOSPITAL_COMMUNITY): Payer: Self-pay

## 2014-04-26 ENCOUNTER — Other Ambulatory Visit: Payer: Self-pay | Admitting: Hematology and Oncology

## 2014-04-26 DIAGNOSIS — C109 Malignant neoplasm of oropharynx, unspecified: Secondary | ICD-10-CM | POA: Insufficient documentation

## 2014-04-26 DIAGNOSIS — I7090 Unspecified atherosclerosis: Secondary | ICD-10-CM | POA: Insufficient documentation

## 2014-04-26 LAB — GLUCOSE, CAPILLARY: Glucose-Capillary: 96 mg/dL (ref 70–99)

## 2014-04-26 MED ORDER — FLUDEOXYGLUCOSE F - 18 (FDG) INJECTION: 8.0000 | Freq: Once | INTRAVENOUS | Status: AC | PRN

## 2014-04-26 NOTE — Telephone Encounter (Signed)
In follow-up to patient's 7/15 consult with Dr. Isidore Moos, spoke with [facility] OP pharmacist Gaspar Bidding yesterday re: oral solution alternatives for the following medications:  PAXIL - she rec'd liquid form last week from this location; cheaper alternatives:  Fluoxetine 20mg /39mL; Sertraline 20mg /ml.   VALIUM - available in liquid form 5mg /66ml.  ADDERALL XR Gurney Maxin ER 25mg /57ml.  Dr. Isidore Moos informed, I will follow-up with patient.  Gayleen Orem, RN, BSN, [facility] Head & Neck Oncology Navigator [phone]

## 2014-04-27 ENCOUNTER — Telehealth: Payer: Self-pay | Admitting: Hematology and Oncology

## 2014-04-27 ENCOUNTER — Ambulatory Visit (HOSPITAL_COMMUNITY)
Admission: RE | Admit: 2014-04-27 | Discharge: 2014-04-27 | Disposition: A | Discharge status: Home / Self Care | Payer: BC Managed Care – PPO | Source: Ambulatory Visit | Attending: Radiation Oncology | Admitting: Radiation Oncology

## 2014-04-27 ENCOUNTER — Encounter: Payer: Self-pay | Admitting: Hematology and Oncology

## 2014-04-27 ENCOUNTER — Other Ambulatory Visit (HOSPITAL_COMMUNITY): Payer: BC Managed Care – PPO

## 2014-04-27 ENCOUNTER — Other Ambulatory Visit: Payer: Self-pay | Admitting: Hematology and Oncology

## 2014-04-27 ENCOUNTER — Telehealth: Payer: Self-pay | Admitting: *Deleted

## 2014-04-27 DIAGNOSIS — Z9221 Personal history of antineoplastic chemotherapy: Secondary | ICD-10-CM | POA: Diagnosis not present

## 2014-04-27 DIAGNOSIS — C100 Malignant neoplasm of vallecula: Secondary | ICD-10-CM | POA: Insufficient documentation

## 2014-04-27 DIAGNOSIS — R1313 Dysphagia, pharyngeal phase: Secondary | ICD-10-CM | POA: Diagnosis not present

## 2014-04-27 DIAGNOSIS — F17200 Nicotine dependence, unspecified, uncomplicated: Secondary | ICD-10-CM | POA: Insufficient documentation

## 2014-04-27 DIAGNOSIS — Z981 Arthrodesis status: Secondary | ICD-10-CM | POA: Insufficient documentation

## 2014-04-27 DIAGNOSIS — E785 Hyperlipidemia, unspecified: Secondary | ICD-10-CM | POA: Diagnosis not present

## 2014-04-27 DIAGNOSIS — M47812 Spondylosis without myelopathy or radiculopathy, cervical region: Secondary | ICD-10-CM | POA: Diagnosis not present

## 2014-04-27 DIAGNOSIS — I10 Essential (primary) hypertension: Secondary | ICD-10-CM | POA: Diagnosis not present

## 2014-04-27 DIAGNOSIS — R1310 Dysphagia, unspecified: Secondary | ICD-10-CM

## 2014-04-27 DIAGNOSIS — C321 Malignant neoplasm of supraglottis: Secondary | ICD-10-CM | POA: Insufficient documentation

## 2014-04-27 DIAGNOSIS — C109 Malignant neoplasm of oropharynx, unspecified: Secondary | ICD-10-CM

## 2014-04-27 HISTORY — DX: Dysphagia, unspecified: R13.10

## 2014-04-27 NOTE — Telephone Encounter (Signed)
I reviewed the results of the PET scan with the patient. She has canceled her appointment at [place]. PET CT scan show bilateral lymphadenopathy. In this situation, I would recommend treating her with concurrent chemoradiation therapy. I will refer her to Gen. surgery for placement of feeding tube and port as well as chemotherapy education class. I would defer to Dr. Lawana Chambers regarding the need teeth extraction. I will see her back next week in my clinic to assess pain control. I addressed all her questions.

## 2014-04-27 NOTE — Telephone Encounter (Signed)
Called patient to see if she needs additional oral solution alternatives for current medications.  She reported she manages well with tablet form of Valium by splitting and dissolving it in water; no longer taking Adderrall XR; confirmed she has liquid form for Paxil.  She reported Dr. Alvy Bimler called her this morning with results of PET and that appts are being scheduled for chemo ed, PEG placement, swallowing therapy.  Initiating navigation as L1 patient (new patient) with this encounter.  Gayleen Orem, RN, BSN, [facility] Head & Neck Oncology Navigator [phone]

## 2014-04-27 NOTE — Telephone Encounter (Signed)
sw. pt and advsied on Aug appts...pt ok adn aware....pt sched to seed Dr. Hassell Done on 8.20 @ 9:45am....advised Dr. Cletus Gash on DT

## 2014-04-27 NOTE — Procedures (Signed)
Objective Swallowing Evaluation: Modified Barium Swallowing Study  Patient Details  Name: Stacey Carroll MRN: [id number] Date of Birth: 26-Dec-1960  Today's Date: 04/27/2014 Time: 1007-1050 SLP Time Calculation (min): 43 min  Past Medical History:  Past Medical History  Diagnosis Date  . Visual disturbance 03/11/2013  . Dyslipidemia   . Headache(784.0)   . Depression   . Endometriosis   . Hypertension   . Hypercholesterolemia   . Anxiety   . Scoliosis   . Arthritis   . Throat pain 04/19/2014  . Dysphagia 04/27/2014   Past Surgical History:  Past Surgical History  Procedure Laterality Date  . Cervical laminectomy  2007 (?), 2011  . Shoulder surgery Left   . Abdominal hysterectomy  1987  . 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  Weight loss, dysphagia, throat pain noted x several months.    Plans are for chemoradiation per MD office note.  Pt PMH + for tobacco use, DJD of cervical spine s/p ACDF, HA, dyslipidemia, anxiety, scoliosis, depression, endometriosis, arthritis, HTN, hypercholesterolemia.  PSH cervical laminectomy - ? 2007 and 2011, ? ACDF C4-C6 ? 2004.  Marland Kitchen  Pt for swallow evaluation as she reports dysphagia x several months requiring her to subside off of liquids.  CXR 7/10 negative.       Assessment / Plan / Recommendation Clinical Impression  Dysphagia Diagnosis: Severe pharyngeal phase dysphagia;Moderate pharyngeal phase dysphagia Clinical impression:   Moderately severe pharyngeal phase dysphagia due to vallecular tumor preventing adequate epiglottic deflection resulting in stasis and mild amount of laryngeal penetration.  Pt conducted multiple swallows to help clear stasis.  Pt  demonstrates adequate sensation to moderate stasis but not to trace amount.  She was able to "hock" and expectorate mild amounts that she did not swallow.     Due to pt's cervical spine disease and previous surgeries, available compensation postures were limited.  Minimal head turn right/left did not decrease stasis.  SLP only tested thin and nectar consistencies due to amount of stasis and aspiration risk.  Super supraglottic swallow reviewed, demonstrated with return demonstration under flour for use as indicated as pt has decreased laryngeal elevation.   Pt noted to cough a few times during testing but did not appear with aspiration and trace laryngeal penetration only.     At completion of testing when pt was sitting in regular chair, she then reported she elevates her chin to help with clearance - did not assess this under flouro as pt did not mention it previously when asked and was concerned re: DDD.    Discussion re: thin liquid supplements *eg Boost Breeze* for ease of swallow.  Also discussed importance of pt continuing to try to consume - even water - to prevent disuse muscle atrophy.  Xerostomia noted for which slp provided written and verbal compensations.   Pt reports plan to have feeding tube placed for nutrition and SLP agrees that pt's current level of dysphagia likely impairs ability to maintain nutrition.       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  Weight loss, dysphagia, throat pain noted x several months.    Plans are for chemoradiation per MD office note.  Pt PMH + for tobacco use, DJD of cervical spine s/p ACDF, HA, dyslipidemia, anxiety, scoliosis, depression, endometriosis, arthritis, HTN, hypercholesterolemia.  PSH cervical laminectomy - ? 2007 and 2011, ? ACDF C4-C6 ? 2004.  Marland Kitchen  Pt for swallow evaluation as she reports dysphagia x several months requiring her to subside off of liquids.  CXR 7/10 negative.   Type of Study: Modified Barium Swallowing Study Reason for Referral: Objectively evaluate swallowing function Diet Prior to this Study: Thin liquids Temperature Spikes Noted: No Respiratory Status: Room air History of Recent Intubation: No Behavior/Cognition: Alert;Cooperative;Pleasant mood Oral Cavity - Dentition: Adequate natural dentition Oral Motor / Sensory Function: Within functional limits Self-Feeding Abilities: Able to feed self Patient Positioning: Upright in bed Baseline Vocal Quality: Clear Volitional Cough: Strong Volitional Swallow: Able to elicit (poor laryngeal elevation) Anatomy: Other (Comment) (pt has a vallecular tumor )    Reason for Referral Objectively evaluate swallowing function   Oral Phase Oral Preparation/Oral Phase Oral Phase: WFL Oral - Nectar Oral - Nectar Cup: Within functional limits Oral - Thin Oral - Thin Teaspoon: Within functional limits Oral - Thin Cup: Within functional limits Oral - Thin Straw: Within functional limits   Pharyngeal Phase Pharyngeal - Nectar Pharyngeal - Nectar Cup: Reduced epiglottic inversion;Reduced anterior laryngeal mobility;Reduced laryngeal elevation;Reduced airway/laryngeal closure;Pharyngeal residue - valleculae;Reduced tongue base retraction Pharyngeal - Thin Pharyngeal - Thin Teaspoon: Reduced epiglottic inversion;Reduced anterior  laryngeal mobility;Reduced laryngeal elevation;Reduced airway/laryngeal closure;Pharyngeal residue - valleculae;Reduced tongue base retraction;Penetration/Aspiration during swallow Penetration/Aspiration details (thin teaspoon): Material enters airway, CONTACTS cords then ejected out Pharyngeal - Thin Cup: Reduced epiglottic inversion;Reduced anterior laryngeal mobility;Reduced laryngeal elevation;Reduced airway/laryngeal closure;Pharyngeal residue - valleculae;Reduced tongue base retraction Pharyngeal - Thin Straw: Reduced epiglottic inversion;Reduced anterior laryngeal mobility;Reduced laryngeal elevation;Reduced airway/laryngeal closure;Pharyngeal residue - valleculae;Reduced tongue base retraction  Cervical Esophageal Phase    GO    Cervical Esophageal Phase Cervical Esophageal Phase: Impaired Cervical Esophageal Phase - Comment Cervical Esophageal Comment: appearance of cervical hardware - C2-T1, appears to be separate plates, trace amount of liquid residuals at lower part of cervical esophagus, esophagus appears widened below area of hardware - radiologist not present to confirm    Functional Assessment Tool Used: clinical judgement Functional Limitations: Swallowing Swallow Current Status (T6256): At least 60 percent but less than 80 percent impaired, limited or restricted Swallow Goal Status [phone]): At least 60 percent but less than 80 percent impaired, limited or restricted Swallow Discharge Status [phone]): At least 60 percent but less than 80 percent impaired, limited or restricted    Luanna Salk, [facility] SLP [phone]

## 2014-04-27 NOTE — Telephone Encounter (Signed)
s.w. pt and advised on changed appt to 7.31.15 with Dr. Marlou Starks at 4pm...pt ok and aware

## 2014-05-02 ENCOUNTER — Ambulatory Visit (HOSPITAL_COMMUNITY): Payer: Medicaid - Dental | Admitting: Dentistry

## 2014-05-02 ENCOUNTER — Encounter (HOSPITAL_COMMUNITY): Payer: Self-pay | Admitting: Dentistry

## 2014-05-02 VITALS — BP 109/76 | HR 70 | Temp 97.7°F

## 2014-05-02 DIAGNOSIS — K0530 Chronic periodontitis, unspecified: Secondary | ICD-10-CM

## 2014-05-02 DIAGNOSIS — Z0189 Encounter for other specified special examinations: Secondary | ICD-10-CM

## 2014-05-02 DIAGNOSIS — C100 Malignant neoplasm of vallecula: Secondary | ICD-10-CM

## 2014-05-02 DIAGNOSIS — K0889 Other specified disorders of teeth and supporting structures: Secondary | ICD-10-CM

## 2014-05-02 DIAGNOSIS — K036 Deposits [accretions] on teeth: Secondary | ICD-10-CM

## 2014-05-02 MED ORDER — SODIUM FLUORIDE 1.1 % DT GEL: DENTAL | Status: DC

## 2014-05-02 NOTE — Progress Notes (Signed)
Patient:            Stacey Carroll Date of Birth:  Nov 23, 1960 MRN:                [id number]   DATE OF PROCEDURE:  05/02/2014               OPERATIVE REPORT   PREOPERATIVE DIAGNOSES: 1. Squamous cell carcinoma of the vallecula 2. Preradiation therapy dental protocol 3. Chronic periodontitis 4. Accretions 5. Tooth mobility  POSTOPERATIVE DIAGNOSES: 1. Squamous cell carcinoma of the vallecula 2. Preradiation therapy dental protocol 3. Chronic periodontitis 4. Accretions 5. Tooth mobility  OPERATIONS: 1. Multiple extraction of tooth numbers 18 and 20 2. Mandibular left alveoloplasty 3. Dental prophylaxis 4. Insertion of fluoride trays and scatter protection devices   SURGEON: Lenn Cal, DDS  ASSISTANT: Camie Patience, (dental assistant)  ANESTHESIA: Local anesthesia only   MEDICATIONS: 1. Local anesthesia with a total utilization of three carpules each containing 34 mg of lidocaine with 0.017 mg of epinephrine  SPECIMENS: There are 2 teeth that were discarded.  DRAINS: None  CULTURES: None  COMPLICATIONS: None   ESTIMATED BLOOD LOSS: Less than 15 mls  INTRAVENOUS FLUIDS: None  INDICATIONS: The patient was recently diagnosed with squamous cell carcinoma of the vallecula.  A medically necessary preradiation therapy dental consultation was then performed. The patient was examined and treatment planned for extraction tooth numbers 18 and 20 with alveoloplasty along with gross debridement of remaining dentition as well as insertion of upper and lower fluoride trays and scatter protection devices.  This treatment plan was formulated to decrease the risks and complications associated with dental infection from affecting the patient's systemic health and to prevent future complications such as radiation caries and osteoradionecrosis.  OPERATIVE FINDINGS: Patient was examined and dental operatory #1.  The teeth were identified for extraction. The patient was noted be  affected by chronic periodontitis, gingival recession, tooth mobility, and accretions.   DESCRIPTION OF PROCEDURE: Patient was brought to the dental operatory #1. Informed consent was then obtained and witnessed. A timeout was performed and the patient and procedures were identified. Patient was then placed in the supine position in the dental chair. The patient was then prepped and draped in usual manner for dental medicine procedure. Local anesthesia was then provided with a total utilization of 3 carpules each containing 36 mg of Xylocaine with 0.018 mg of epinephrine via inferior alveolar nerve block and long buccal nerve blocks. The oral cavity was then thoroughly examined with the findings noted above. The patient was then ready for dental medicine procedure as follows:  At this point time, the mandibular left quadrant was approached. A 15 blade incision was then made from the distal of number 17 and extended to the distal of #21. A surgical flap was then carefully reflected. The membrane placed by Dr. Essie Hart was then removed at this time in the area of #19. A small 2 mm round soft tissue defect on the lingual aspect of #19 was noted at this time . Tooth numbers 18 and 20 were then subluxated with a series of straight elevators. Tooth #18 was then removed with a 23 forceps without complications. Tooth #20 was then removed with a 151 forceps without complications.  Alveoloplasty was then performed utilizing a rongeurs and bone file. The tissues were approximated and trimmed appropriately. The surgical sites were then irrigated with copious amounts of sterile saline.  A piece of Surgifoam was placed in the extraction socket  of #18 and #20 appropriately.The mandibular left surgical site was then closed from the distal of  #17 and extended the distal of #21 utilizing 3-0 chromic gut material in a continuous interrupted suture technique x1. The soft tissue defect in the area of #19 was evaluated and it was  felt that this would heal in without the need for suturing. Patient was made aware of the presence of the soft tissue defect in the area of #19 on the lingual. The patient was also made aware of the potential for exposure of bone on the lingual aspect in the area of #18 through 20 due to the sharpness of the mandibular alveolar ridge.   Remaining dentition was then approached. An adult prophylaxis was completed utilizing a KaVo sonic scaler and selective curettes.  At this point time, the entire mouth was irrigated with copious amounts of sterile saline. The patient was examined for complications, seeing none, the dental medicine surgical procedure was deemed to be complete.  The upper lower fluoride trays and scatter protection devices were then inserted and adjusted as needed. Patient accepted fit and function of the appliances. Postop instructions were provided in a written and verbal format. The patient expressed understanding. At this point time, a series of 4 x 4 gauze were placed in the mouth to aid hemostasis. All counts were correct for the dental medicine procedure. The patient was then returned to the sitting position. Postoperative blood pressure of 108/75 and pulse is 76 was noted at this time.   The patient was then dismissed to the care of her son in good condition. The patient is to return to clinic in 7-10 days for evaluation for suture removal. A prescription for FluoriSHIELD was sent to [facility].  Lenn Cal, DDS.

## 2014-05-02 NOTE — Progress Notes (Signed)
PRE-OPERATIVE NOTE:  05/02/2014 Stacey Carroll [id number]  VITALS: BP 109/76  Pulse 70  Temp(Src) 97.7 F (36.5 C) (Oral)  Lab Results  Component Value Date   WBC 12.8* 04/14/2014   HGB 15.5* 04/14/2014   HCT 44.0 04/14/2014   MCV 90.7 04/14/2014   PLT 347 04/14/2014   BMET    Component Value Date/Time   NA 141 04/14/2014 1020   K 4.2 04/14/2014 1020   CL 101 04/14/2014 1020   CO2 25 04/14/2014 1020   GLUCOSE 109* 04/14/2014 1020   BUN 18 04/14/2014 1020   CREATININE 0.88 04/14/2014 1020   CALCIUM 10.0 04/14/2014 1020   GFRNONAA 73* 04/14/2014 1020   GFRAA 85* 04/14/2014 1020    No results found for this basename: INR, PROTIME   No results found for this basename: PTT     Patient presents for multiple dental extractions with alveoloplasty and initial periodontal therapy along with insertion of fluoride trays and scatter protection devices.   SUBJECTIVE: The patient denies any acute medical or dental changes and agrees to proceed with treatment as planned.  EXAM: No sign of acute dental changes.  ASSESSMENT: Patient is affected by cell carcinoma of the vallecula, preradiation therapy dental protocol, chronic periodontitis, accretions, and tooth mobility.  PLAN: Patient agrees to proceed with treatment as planned in the dental medicine clinic as previously discussed and accepts the risks, benefits, complications of the proposed treatment.   Lenn Cal, DDS

## 2014-05-02 NOTE — Patient Instructions (Addendum)
MOUTH CARE AFTER SURGERY ° °FACTS: °· Ice used in ice bag helps keep the swelling down, and can help lessen the pain. °· It is easier to treat pain BEFORE it happens. °· Spitting disturbs the clot and may cause bleeding to start again, or to get worse. °· Smoking delays healing and can cause complications. °· Sharing prescriptions can be dangerous.  Do not take medications not recently prescribed for you. °· Antibiotics may stop birth control pills from working.  Use other means of birth control while on antibiotics. °· Warm salt water rinses after the first 24 hours will help lessen the swelling:  Use 1/2 teaspoonful of table salt per oz.of water. ° °DO NOT: °· Do not spit.  Do not drink through a straw. °· Strongly advised not to smoke, dip snuff or chew tobacco at least for 3 days. °· Do not eat sharp or crunchy foods.  Avoid the area of surgery when chewing. °· Do not stop your antibiotics before your instructions say to do so. °· Do not eat hot foods until bleeding has stopped.  If you need to, let your food cool down to room temperature. ° °EXPECT: °· Some swelling, especially first 2-3 days. °· Soreness or discomfort in varying degrees.  Follow your dentist's instructions about how to handle pain before it starts. °· Pinkish saliva or light blood in saliva, or on your pillow in the morning.  This can last around 24 hours. °· Bruising inside or outside the mouth.  This may not show up until 2-3 days after surgery.  Don't worry, it will go away in time. °· Pieces of "bone" may work themselves loose.  It's OK.  If they bother you, let us know. ° °WHAT TO DO IMMEDIATELY AFTER SURGERY: °· Bite on the gauze with steady pressure for 1-2 hours.  Don't chew on the gauze. °· Do not lie down flat.  Raise your head support especially for the first 24 hours. °· Apply ice to your face on the side of the surgery.  You may apply it 20 minutes on and a few minutes off.  Ice for 8-12 hours.  You may use ice up to 24  hours. °· Before the numbness wears off, take a pain pill as instructed. °· Prescription pain medication is not always required. ° °SWELLING: °· Expect swelling for the first couple of days.  It should get better after that. °· If swelling increases 3 days or so after surgery; let us know as soon as possible. ° °FEVER: °· Take Tylenol every 4 hours if needed to lower your temperature, especially if it is at 100F or higher. °· Drink lots of fluids. °· If the fever does not go away, let us know. ° °BREATHING TROUBLE: °· Any unusual difficulty breathing means you have to have someone bring you to the emergency room ASAP ° °BLEEDING: °· Light oozing is expected for 24 hours or so. °· Prop head up with pillows °· Avoid spitting °· Do not confuse bright red fresh flowing blood with lots of saliva colored with a little bit of blood. °· If you notice some bleeding, place gauze or a tea bag where it is bleeding and apply CONSTANT pressure by biting down for 1 hour.  Avoid talking during this time.  Do not remove the gauze or tea bag during this hour to "check" the bleeding. °· If you notice bright RED bleeding FLOWING out of particular area, and filling the floor of your mouth, put   a wad of gauze on that area, bite down firmly and constantly.  Call us immediately.  If we're closed, have someone bring you to the emergency room.  ORAL HYGIENE:  Brush your teeth as usual after meals and before bedtime.  Use a soft toothbrush around the area of surgery.  DO NOT AVOID BRUSHING.  Otherwise bacteria(germs) will grow and may delay healing or encourage infection.  Since you cannot spit, just gently rinse and let the water flow out of your mouth.  DO NOT SWISH HARD.  EATING:  Cool liquids are a good point to start.  Increase to soft foods as tolerated.  PRESCRIPTIONS:  Follow the directions for your prescriptions exactly as written.  If Dr. Cailen Mihalik gave you a narcotic pain medication, do not drive, operate  machinery or drink alcohol when on that medication.  QUESTIONS:  Call our office during office hours [phone] or call the Emergency Room at [phone].  FLUORIDE TRAYS PATIENT INSTRUCTIONS    Obtain prescription from the pharmacy.  Don't be surprised if it needs to be ordered.  Be sure to let the pharmacy know when you are close to needing a new refill for them to have it ready for you without interruption of Fluoride use.  The best time to use your Fluoride is before bed time.  You must brush your teeth very well and floss before using the Fluoride in order to get the best use out of the Fluoride treatments.  Place 1 drop of Fluoride gel per tooth in the tray.  Place the tray on your lower teeth and your upper teeth.  Make sure the trays are seated all the way.  Remember, they only fit one way on your teeth.  Insert for 5 full minutes.  At the end of the 5 minutes, take the trays out.  SPIT OUT excess. .  Do NOT rinse your mouth!  Do NOT eat or drink after treatments for at least 30 minutes.  This is why the best time for your treatments is before bedtime.  Clean the inside of your Fluoride trays using COLD WATER and a toothbrush.  In order to keep your Trays from discoloring and free from odors, soak them overnight in denture cleaners such as Efferdent.  Do not use bleach or non denture products.  Store the trays in a safe dry place AWAY from any heat until your next treatment.  If anything happens to your Fluoride trays, or they don't fit as well after any dental work, please let us know as soon as possible.  

## 2014-05-04 ENCOUNTER — Encounter: Payer: Self-pay | Admitting: *Deleted

## 2014-05-04 ENCOUNTER — Ambulatory Visit (HOSPITAL_BASED_OUTPATIENT_CLINIC_OR_DEPARTMENT_OTHER): Payer: BC Managed Care – PPO | Admitting: Hematology and Oncology

## 2014-05-04 ENCOUNTER — Ambulatory Visit (HOSPITAL_BASED_OUTPATIENT_CLINIC_OR_DEPARTMENT_OTHER): Payer: BC Managed Care – PPO

## 2014-05-04 ENCOUNTER — Other Ambulatory Visit: Payer: Self-pay | Admitting: Hematology and Oncology

## 2014-05-04 ENCOUNTER — Other Ambulatory Visit: Payer: BC Managed Care – PPO

## 2014-05-04 ENCOUNTER — Encounter: Payer: Self-pay | Admitting: Hematology and Oncology

## 2014-05-04 VITALS — BP 118/73 | HR 70 | Temp 98.5°F | Resp 20 | Ht 64.0 in | Wt 124.2 lb

## 2014-05-04 DIAGNOSIS — F17200 Nicotine dependence, unspecified, uncomplicated: Secondary | ICD-10-CM

## 2014-05-04 DIAGNOSIS — C100 Malignant neoplasm of vallecula: Secondary | ICD-10-CM

## 2014-05-04 DIAGNOSIS — C109 Malignant neoplasm of oropharynx, unspecified: Secondary | ICD-10-CM

## 2014-05-04 DIAGNOSIS — R070 Pain in throat: Secondary | ICD-10-CM

## 2014-05-04 DIAGNOSIS — R1310 Dysphagia, unspecified: Secondary | ICD-10-CM

## 2014-05-04 DIAGNOSIS — Z720 Tobacco use: Secondary | ICD-10-CM

## 2014-05-04 LAB — COMPREHENSIVE METABOLIC PANEL (CC13)
ALBUMIN: 3.3 g/dL — AB (ref 3.5–5.0)
ALT: 7 U/L (ref 0–55)
AST: 13 U/L (ref 5–34)
Alkaline Phosphatase: 106 U/L (ref 40–150)
Anion Gap: 8 mEq/L (ref 3–11)
BUN: 13.7 mg/dL (ref 7.0–26.0)
CALCIUM: 10 mg/dL (ref 8.4–10.4)
CHLORIDE: 104 meq/L (ref 98–109)
CO2: 28 meq/L (ref 22–29)
CREATININE: 0.9 mg/dL (ref 0.6–1.1)
Glucose: 91 mg/dl (ref 70–140)
Potassium: 4.2 mEq/L (ref 3.5–5.1)
Sodium: 140 mEq/L (ref 136–145)
TOTAL PROTEIN: 7.4 g/dL (ref 6.4–8.3)
Total Bilirubin: 0.44 mg/dL (ref 0.20–1.20)

## 2014-05-04 LAB — CBC WITH DIFFERENTIAL/PLATELET
BASO%: 0.4 % (ref 0.0–2.0)
Basophils Absolute: 0 10*3/uL (ref 0.0–0.1)
EOS%: 2.8 % (ref 0.0–7.0)
Eosinophils Absolute: 0.3 10*3/uL (ref 0.0–0.5)
HEMATOCRIT: 41.2 % (ref 34.8–46.6)
HEMOGLOBIN: 14.2 g/dL (ref 11.6–15.9)
LYMPH#: 2.4 10*3/uL (ref 0.9–3.3)
LYMPH%: 21.8 % (ref 14.0–49.7)
MCH: 31.7 pg (ref 25.1–34.0)
MCHC: 34.5 g/dL (ref 31.5–36.0)
MCV: 92 fL (ref 79.5–101.0)
MONO#: 0.9 10*3/uL (ref 0.1–0.9)
MONO%: 8.2 % (ref 0.0–14.0)
NEUT#: 7.4 10*3/uL — ABNORMAL HIGH (ref 1.5–6.5)
NEUT%: 66.8 % (ref 38.4–76.8)
Platelets: 329 10*3/uL (ref 145–400)
RBC: 4.48 10*6/uL (ref 3.70–5.45)
RDW: 13.2 % (ref 11.2–14.5)
WBC: 11 10*3/uL — ABNORMAL HIGH (ref 3.9–10.3)

## 2014-05-04 LAB — MAGNESIUM (CC13): Magnesium: 2.2 mg/dl (ref 1.5–2.5)

## 2014-05-04 MED ORDER — FENTANYL 50 MCG/HR TD PT72: 50.0000 ug | MEDICATED_PATCH | TRANSDERMAL | Status: DC

## 2014-05-04 MED ORDER — ONDANSETRON HCL 8 MG PO TABS: 8.0000 mg | ORAL_TABLET | Freq: Three times a day (TID) | ORAL | Status: DC | PRN

## 2014-05-04 MED ORDER — PROCHLORPERAZINE MALEATE 10 MG PO TABS: 10.0000 mg | ORAL_TABLET | Freq: Four times a day (QID) | ORAL | Status: DC | PRN

## 2014-05-04 MED ORDER — POLYETHYLENE GLYCOL 3350 17 G PO PACK: 17.0000 g | PACK | Freq: Every day | ORAL | Status: DC

## 2014-05-04 MED ORDER — MORPHINE SULFATE (CONCENTRATE) 20 MG/ML PO SOLN: 20.0000 mg | ORAL | Status: DC | PRN

## 2014-05-04 MED ORDER — LIDOCAINE-PRILOCAINE 2.5-2.5 % EX CREA: 1.0000 "application " | TOPICAL_CREAM | CUTANEOUS | Status: DC | PRN

## 2014-05-04 NOTE — Assessment & Plan Note (Signed)
We discussed the role of chemotherapy. The intent is for cure.  We discussed some of the risks, benefits, side-effects of cisplatin. Some of the short term side-effects included, though not limited to, including weight loss, life threatening infections, risk of allergic reactions, need for transfusions of blood products, nausea, vomiting, change in bowel habits, loss of hair, admission to hospital for various reasons, and risks of death.   Long term side-effects are also discussed including risks of infertility, permanent damage to nerve function, hearing loss, chronic fatigue, kidney damage with possibility needing hemodialysis, and rare secondary malignancy including bone marrow disorders.  The patient is aware that the response rates discussed earlier is not guaranteed.  After a long discussion, patient made an informed decision to proceed with the prescribed plan of care.   Patient education material was dispensed. We discussed the role of chemotherapy. The intent is for cure.  We discussed some of the risks, benefits, side-effects of cisplatin. Some of the short term side-effects included, though not limited to, including weight loss, life threatening infections, risk of allergic reactions, need for transfusions of blood products, nausea, vomiting, change in bowel habits, loss of hair, admission to hospital for various reasons, and risks of death.   Long term side-effects are also discussed including risks of infertility, permanent damage to nerve function, hearing loss, chronic fatigue, kidney damage with possibility needing hemodialysis, and rare secondary malignancy including bone marrow disorders.  The patient is aware that the response rates discussed earlier is not guaranteed.  After a long discussion, patient made an informed decision to proceed with the prescribed plan of care and went ahead to sign the consent form today.   Patient education material was dispensed.  Treatment  decision is based on NCCN guidelines, 100 mg/m2 every 3 weeks for total of 3 cycles with concurrent radiation treatment. References as follows:  Postoperative Concurrent Radiotherapy and Chemotherapy for High-Risk Squamous-Cell Carcinoma of the Head and Neck Ulice Dash S. Burt Knack, M.D., Lucille Passy. Georjean Mode, Ph.D., Arlene A. Jasper Loser, M.D., Blase Mess, M.D., Darrick Penna. Megan Salon, M.D., [place] Jonna Munro, M.D., Domingo Cocking. Lyndon Code, M.D., Sallye Lat, M.D., Luciano Cutter, M.D., Polly Cobia, M.D., Minerva Ends, M.D., Sandre Kitty, M.D., [place] Mariane Duval, M.D., Trinidad Curet, M.D., Meryl Dare, M.D., and Danae Orleans. Fu, M.D. for the [facility] [address] [facility] 2004; [id number] 1, 2004DOI: [phone]

## 2014-05-04 NOTE — Progress Notes (Signed)
[facility] OFFICE PROGRESS NOTE  Patient Care Team: Jonathon Bellows, MD as PCP - General (Family Medicine) Heather [place], [place] as Consulting Physician (Optometry) Brooks Sailors, RN as Oncology Nurse Navigator (Oncology)  SUMMARY OF ONCOLOGIC HISTORY: Oncology History   Carcinoma of vallecula epiglottica Oropharyngeal cancer   Primary site: Pharynx - Oropharynx   Staging method: AJCC 7th Edition   Clinical: Stage IVA (T3, N2c, M0) signed by Eppie Gibson, MD on 04/20/2014  3:33 PM   Summary: Stage IVA (T3, N2c, M0)       Oropharyngeal cancer   04/14/2014 Imaging CT scan of the neck showed oropharyngeal mass.   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   04/14/2014 Surgery Laryngoscopy of the lower tongue base, vallecula, and lingual surface of the epiglottis showed a fungating irregular mass   04/26/2014 Imaging PET/CT scan showed bilateral lymphadenopathy   04/27/2014 Imaging Barium swallow shows severe dysphagia.    INTERVAL HISTORY: Please see below for problem oriented charting. She returns to review test results. She continues to complain of severe pain. She is taking liquid morphine as prescribed every 2 hours and it is not controlling her pain.  She denies nausea. She had mild constipation.  REVIEW OF SYSTEMS:   Constitutional: Denies fevers, chills  Eyes: Denies blurriness of vision Respiratory: Denies cough, dyspnea or wheezes Cardiovascular: Denies palpitation, chest discomfort or lower extremity swelling Skin: Denies abnormal skin rashes Lymphatics: Denies new lymphadenopathy or easy bruising Neurological:Denies numbness, tingling or new weaknesses Behavioral/Psych: Mood is stable, no new changes  All other systems were reviewed with the patient and are negative.  I have reviewed the past medical history, past surgical history, social history and family history with  the patient and they are unchanged from previous note.  ALLERGIES:  is allergic to penicillins.  MEDICATIONS:  Current Outpatient Prescriptions  Medication Sig Dispense Refill  . amphetamine-dextroamphetamine (ADDERALL XR) 20 MG 24 hr capsule       . diazepam (VALIUM) 10 MG tablet Take 10 mg by mouth every 6 (six) hours as needed for anxiety.      . fluticasone (FLONASE) 50 MCG/ACT nasal spray       . ipratropium-albuterol (DUONEB) 0.5-2.5 (3) MG/3ML SOLN       . morphine (ROXANOL) 20 MG/ML concentrated solution Take 1 mL (20 mg total) by mouth every 2 (two) hours as needed for severe pain.  120 mL  0  . PARoxetine (PAXIL) 20 MG tablet Take 20 mg by mouth daily.      . sodium fluoride (FLUORISHIELD) 1.1 % GEL dental gel Instill one drop of fluoride per tooth space of fluoride tray. Place over teeth for 5 minutes. Remove. Spit out excess. Repeat nightly.  120 mL  prn  . fentaNYL (DURAGESIC - DOSED MCG/HR) 50 MCG/HR Place 1 patch (50 mcg total) onto the skin every 3 (three) days.  5 patch  0  . lidocaine-prilocaine (EMLA) cream Apply 1 application topically as needed.  30 g  6  . ondansetron (ZOFRAN) 8 MG tablet Take 1 tablet (8 mg total) by mouth every 8 (eight) hours as needed for nausea.  60 tablet  1  . polyethylene glycol (MIRALAX / GLYCOLAX) packet Take 17 g by mouth daily.  14 each  0  . prochlorperazine (COMPAZINE) 10 MG tablet Take 1 tablet (10 mg total) by mouth every 6 (six) hours as needed (Nausea or vomiting).  60 tablet  1   No current facility-administered medications for this visit.    PHYSICAL EXAMINATION: ECOG PERFORMANCE STATUS: 1 - Symptomatic but completely ambulatory  Filed Vitals:   05/04/14 1349  BP: 118/73  Pulse: 70  Temp: 98.5 F (36.9 C)  Resp: 20   Filed Weights   05/04/14 1349  Weight: 124 lb 3.2 oz (56.337 kg)    GENERAL:alert, no distress and comfortable. She looks thin SKIN: skin color, texture, turgor are normal, no rashes or significant  lesions Musculoskeletal:no cyanosis of digits and no clubbing  NEURO: alert & oriented x 3 with fluent speech, no focal motor/sensory deficits  LABORATORY DATA:  I have reviewed the data as listed    Component Value Date/Time   NA 140 05/04/2014 1446   NA 141 04/14/2014 1020   K 4.2 05/04/2014 1446   K 4.2 04/14/2014 1020   CL 101 04/14/2014 1020   CO2 28 05/04/2014 1446   CO2 25 04/14/2014 1020   GLUCOSE 91 05/04/2014 1446   GLUCOSE 109* 04/14/2014 1020   BUN 13.7 05/04/2014 1446   BUN 18 04/14/2014 1020   CREATININE 0.9 05/04/2014 1446   CREATININE 0.88 04/14/2014 1020   CALCIUM 10.0 05/04/2014 1446   CALCIUM 10.0 04/14/2014 1020   PROT 7.4 05/04/2014 1446   PROT 7.7 04/14/2014 1020   ALBUMIN 3.3* 05/04/2014 1446   ALBUMIN 4.0 04/14/2014 1020   AST 13 05/04/2014 1446   AST 17 04/14/2014 1020   ALT 7 05/04/2014 1446   ALT 13 04/14/2014 1020   ALKPHOS 106 05/04/2014 1446   ALKPHOS 117 04/14/2014 1020   BILITOT 0.44 05/04/2014 1446   BILITOT 0.3 04/14/2014 1020   GFRNONAA 73* 04/14/2014 1020   GFRAA 85* 04/14/2014 1020    No results found for this basename: SPEP, UPEP,  kappa and lambda light chains    Lab Results  Component Value Date   WBC 11.0* 05/04/2014   NEUTROABS 7.4* 05/04/2014   HGB 14.2 05/04/2014   HCT 41.2 05/04/2014   MCV 92.0 05/04/2014   PLT 329 05/04/2014      Chemistry      Component Value Date/Time   NA 140 05/04/2014 1446   NA 141 04/14/2014 1020   K 4.2 05/04/2014 1446   K 4.2 04/14/2014 1020   CL 101 04/14/2014 1020   CO2 28 05/04/2014 1446   CO2 25 04/14/2014 1020   BUN 13.7 05/04/2014 1446   BUN 18 04/14/2014 1020   CREATININE 0.9 05/04/2014 1446   CREATININE 0.88 04/14/2014 1020      Component Value Date/Time   CALCIUM 10.0 05/04/2014 1446   CALCIUM 10.0 04/14/2014 1020   ALKPHOS 106 05/04/2014 1446   ALKPHOS 117 04/14/2014 1020   AST 13 05/04/2014 1446   AST 17 04/14/2014 1020   ALT 7 05/04/2014 1446   ALT 13 04/14/2014 1020   BILITOT 0.44 05/04/2014 1446   BILITOT 0.3  04/14/2014 1020     ASSESSMENT & PLAN:  Oropharyngeal cancer We discussed the role of chemotherapy. The intent is for cure.  We discussed some of the risks, benefits, side-effects of cisplatin. Some of the short term side-effects included, though not limited to, including weight loss, life threatening infections, risk of allergic reactions, need for transfusions of blood products, nausea, vomiting, change in bowel habits, loss of hair, admission to hospital for various reasons, and risks of death.   Long term side-effects are also discussed including risks of infertility, permanent damage to nerve  function, hearing loss, chronic fatigue, kidney damage with possibility needing hemodialysis, and rare secondary malignancy including bone marrow disorders.  The patient is aware that the response rates discussed earlier is not guaranteed.  After a long discussion, patient made an informed decision to proceed with the prescribed plan of care.   Patient education material was dispensed. We discussed the role of chemotherapy. The intent is for cure.  We discussed some of the risks, benefits, side-effects of cisplatin. Some of the short term side-effects included, though not limited to, including weight loss, life threatening infections, risk of allergic reactions, need for transfusions of blood products, nausea, vomiting, change in bowel habits, loss of hair, admission to hospital for various reasons, and risks of death.   Long term side-effects are also discussed including risks of infertility, permanent damage to nerve function, hearing loss, chronic fatigue, kidney damage with possibility needing hemodialysis, and rare secondary malignancy including bone marrow disorders.  The patient is aware that the response rates discussed earlier is not guaranteed.  After a long discussion, patient made an informed decision to proceed with the prescribed plan of care and went ahead to sign the consent form today.    Patient education material was dispensed.  Treatment decision is based on NCCN guidelines, 100 mg/m2 every 3 weeks for total of 3 cycles with concurrent radiation treatment. References as follows:  Postoperative Concurrent Radiotherapy and Chemotherapy for High-Risk Squamous-Cell Carcinoma of the Head and Neck Ulice Dash S. Burt Knack, M.D., Lucille Passy. Georjean Mode, Ph.D., Arlene A. Jasper Loser, M.D., Blase Mess, M.D., Darrick Penna. Megan Salon, M.D., [place] Jonna Munro, M.D., Domingo Cocking. Lyndon Code, M.D., Sallye Lat, M.D., Luciano Cutter, M.D., Polly Cobia, M.D., Minerva Ends, M.D., Sandre Kitty, M.D., [place]. Mariane Duval, M.D., Trinidad Curet, M.D., Meryl Dare, M.D., and Danae Orleans. Fu, M.D. for the [facility] [address] [employer] [facility] 2004; [phone], 2004DOI: 10.1056/[id number]     Tobacco abuse I spent some time counseling the patient the importance of tobacco cessation. she is currently attempting to quit on her own  Throat pain I recommend adding fentanyl patch and to increase prescription liquid morphine as needed.  Dysphagia General surgical consultation is pending for feeding tube. I continue to encourage patient to swallow as tolerated.   Orders Placed This Encounter  Procedures  . CBC with Differential    Standing Status: Standing     Number of Occurrences: 9     Standing Expiration Date: 05/05/2015  . Comprehensive metabolic panel    Standing Status: Standing     Number of Occurrences: 9     Standing Expiration Date: 05/05/2015  . Magnesium    Standing Status: Standing     Number of Occurrences: 9     Standing Expiration Date: 05/05/2015   All questions were answered. The patient knows to call the clinic with any problems, questions or concerns. No barriers to learning was detected. I spent 40 minutes counseling the patient face to face. The total time spent in the appointment was 60 minutes and more than 50% was on counseling and review of test  results     [facility], Lucien, MD 05/04/2014 3:55 PM

## 2014-05-04 NOTE — Assessment & Plan Note (Signed)
General surgical consultation is pending for feeding tube. I continue to encourage patient to swallow as tolerated.

## 2014-05-04 NOTE — Assessment & Plan Note (Signed)
I spent some time counseling the patient the importance of tobacco cessation. she is currently attempting to quit on her own 

## 2014-05-04 NOTE — Assessment & Plan Note (Signed)
I recommend adding fentanyl patch and to increase prescription liquid morphine as needed.

## 2014-05-05 ENCOUNTER — Telehealth: Payer: Self-pay | Admitting: Hematology and Oncology

## 2014-05-05 ENCOUNTER — Encounter (INDEPENDENT_AMBULATORY_CARE_PROVIDER_SITE_OTHER): Payer: Self-pay | Admitting: General Surgery

## 2014-05-05 ENCOUNTER — Ambulatory Visit (INDEPENDENT_AMBULATORY_CARE_PROVIDER_SITE_OTHER): Payer: BC Managed Care – PPO | Admitting: General Surgery

## 2014-05-05 VITALS — BP 122/70 | HR 90 | Temp 97.9°F | Ht 65.0 in | Wt 122.0 lb

## 2014-05-05 DIAGNOSIS — C100 Malignant neoplasm of vallecula: Secondary | ICD-10-CM

## 2014-05-05 NOTE — Telephone Encounter (Signed)
s.w.l pt and advised on Aug appt....sed added tx.Marland KitchenMarland KitchenMarland KitchenMarland Kitchenpt ok adn aware

## 2014-05-05 NOTE — Progress Notes (Signed)
Patient ID: Stacey [place], female   DOB: 1961-02-28, 53 y.o.   MRN: [id number]  Chief Complaint  Patient presents with  . eval for pac    HPI Stacey Carroll is a 53 y.o. female.  We're asked to see the patient in consultation by Dr. Alvy Bimler to evaluate her for placement of a Port-A-Cath and G-tube. The patient was recently diagnosed with oral pharyngeal cancer. She states that she has been unable to swallow food for the last 5-6 months. She has basically been surviving on pudding.  HPI  Past Medical History  Diagnosis Date  . Visual disturbance 03/11/2013  . Dyslipidemia   . Headache(784.0)   . Depression   . Endometriosis   . Hypertension   . Hypercholesterolemia   . Anxiety   . Scoliosis   . Arthritis   . Throat pain 04/19/2014  . Dysphagia 04/27/2014    Past Surgical History  Procedure Laterality Date  . Cervical laminectomy  2007 (?), 2011  . Shoulder surgery Left   . Abdominal hysterectomy  1987  . Laryngoscopy and esophagoscopy N/A 04/14/2014    Procedure: DIRECT LARYNGOSCOPY WITH BIOPSY  AND ESOPHAGOSCOPY;  Surgeon: Ruby Cola, MD;  Location: [facility] OR;  Service: ENT;  Laterality: N/A;    Family History  Problem Relation Age of Onset  . Emphysema Mother   . Cancer Mother     throat ca  . Cancer Father     prostate ca  . Heart disease Father     Social History History  Substance Use Topics  . Smoking status: Current Every Day Smoker -- 0.50 packs/day for 39 years    Types: Cigarettes  . Smokeless tobacco: Never Used  . Alcohol Use: Yes     Comment: occasional    Allergies  Allergen Reactions  . Penicillins     From childhood    Current Outpatient Prescriptions  Medication Sig Dispense Refill  . amphetamine-dextroamphetamine (ADDERALL XR) 20 MG 24 hr capsule       . diazepam (VALIUM) 10 MG tablet Take 10 mg by mouth every 6 (six) hours as needed for anxiety.      . fentaNYL (DURAGESIC - DOSED MCG/HR) 50 MCG/HR Place 1 patch (50 mcg total) onto the skin  every 3 (three) days.  5 patch  0  . fluticasone (FLONASE) 50 MCG/ACT nasal spray       . ipratropium-albuterol (DUONEB) 0.5-2.5 (3) MG/3ML SOLN       . lidocaine-prilocaine (EMLA) cream Apply 1 application topically as needed.  30 g  6  . morphine (ROXANOL) 20 MG/ML concentrated solution Take 1 mL (20 mg total) by mouth every 2 (two) hours as needed for severe pain.  120 mL  0  . ondansetron (ZOFRAN) 8 MG tablet Take 1 tablet (8 mg total) by mouth every 8 (eight) hours as needed for nausea.  60 tablet  1  . PARoxetine (PAXIL) 20 MG tablet Take 20 mg by mouth daily.      . polyethylene glycol (MIRALAX / GLYCOLAX) packet Take 17 g by mouth daily.  14 each  0  . prochlorperazine (COMPAZINE) 10 MG tablet Take 1 tablet (10 mg total) by mouth every 6 (six) hours as needed (Nausea or vomiting).  60 tablet  1  . sodium fluoride (FLUORISHIELD) 1.1 % GEL dental gel Instill one drop of fluoride per tooth space of fluoride tray. Place over teeth for 5 minutes. Remove. Spit out excess. Repeat nightly.  120 mL  prn  No current facility-administered medications for this visit.    Review of Systems Review of Systems  Constitutional: Negative.   HENT: Positive for trouble swallowing.   Eyes: Negative.   Respiratory: Negative.   Cardiovascular: Negative.   Gastrointestinal: Negative.   Endocrine: Negative.   Genitourinary: Negative.   Musculoskeletal: Negative.   Skin: Negative.   Allergic/Immunologic: Negative.   Neurological: Negative.   Hematological: Negative.   Psychiatric/Behavioral: Negative.     Blood pressure 122/70, pulse 90, temperature 97.9 F (36.6 C), height 5\' 5"  (1.651 m), weight 122 lb (55.339 kg).  Physical Exam Physical Exam  Constitutional: She is oriented to person, place, and time. She appears well-developed and well-nourished.  HENT:  Head: Normocephalic and atraumatic.  Eyes: Conjunctivae and EOM are normal. Pupils are equal, round, and reactive to light.  Neck:  Normal range of motion. Neck supple.  Cardiovascular: Normal rate, regular rhythm and normal heart sounds.   Pulmonary/Chest: Effort normal and breath sounds normal.  Abdominal: Soft. Bowel sounds are normal.  Musculoskeletal: Normal range of motion.  Lymphadenopathy:    She has no cervical adenopathy.  Neurological: She is alert and oriented to person, place, and time.  Skin: Skin is warm and dry.  Psychiatric: She has a normal mood and affect. Her behavior is normal.    Data Reviewed As above  Assessment    The patient has oropharyngeal cancer and requires a Port-A-Cath for delivery of chemotherapy and a G-tube for nutrition. I've discussed with her in detail the risks and benefits of the operation to place both of these as well as some of the technical aspects and she understands and wishes to proceed     Plan    Plan for placement of Port-A-Cath as well as G-tube        TOTH III,Shon Indelicato S 05/05/2014, 4:40 PM

## 2014-05-08 ENCOUNTER — Encounter: Payer: Self-pay | Admitting: *Deleted

## 2014-05-08 ENCOUNTER — Ambulatory Visit: Payer: BC Managed Care – PPO

## 2014-05-08 ENCOUNTER — Ambulatory Visit
Admission: RE | Admit: 2014-05-08 | Discharge: 2014-05-08 | Disposition: A | Discharge status: Home / Self Care | Payer: BC Managed Care – PPO | Source: Ambulatory Visit | Attending: Radiation Oncology | Admitting: Radiation Oncology

## 2014-05-08 ENCOUNTER — Telehealth: Payer: Self-pay | Admitting: Hematology and Oncology

## 2014-05-08 VITALS — BP 120/82 | HR 106 | Temp 97.9°F

## 2014-05-08 DIAGNOSIS — Z510 Encounter for antineoplastic radiation therapy: Secondary | ICD-10-CM | POA: Diagnosis not present

## 2014-05-08 DIAGNOSIS — C109 Malignant neoplasm of oropharynx, unspecified: Secondary | ICD-10-CM

## 2014-05-08 DIAGNOSIS — C100 Malignant neoplasm of vallecula: Secondary | ICD-10-CM

## 2014-05-08 MED ORDER — SODIUM CHLORIDE 0.9 % IJ SOLN
10.0000 mL | Freq: Once | INTRAMUSCULAR | Status: AC
  Administered 2014-05-08: 10 mL via INTRAVENOUS

## 2014-05-08 NOTE — Progress Notes (Addendum)
No prior contrast region and is not a diabetic. BUN and Creat WNL.  IV start in right antecubital region with a 22 gauge angiocath with 1 attempt..  Brisk blood return and flushed without any difficulty.  3 inch extension applied.  No voiced concerns.  Escorted to [place] with Head and Neck Navigator, [employer]   10:50am I V discontinued by Florian Buff, RN

## 2014-05-08 NOTE — Telephone Encounter (Signed)
lvm for pt regarding to 8.10 appt moved to 8.12 per mD....mailed pt appt sched/avs and letter

## 2014-05-08 NOTE — Progress Notes (Signed)
Simulation, IMRT treatment planning, and Special treatment procedure note   outpatient   ICD-9-CM  1. Carcinoma of vallecula epiglottica 146.3    The patient was taken to the CT simulator and laid in the supine position on the table. An Aquaplast head and shoulder mask was custom fitted to the patient's anatomy. High-resolution CT axial imaging was obtained of the head and neck with contrast. I verified that the quality of the imaging is good for treatment planning. 1 Medically Necessary Treatment Device was fabricated and supervised by me: Aquaplast mask.   Treatment planning note I plan to treat the patient with helical Tomotherapy, IMRT. I plan to treat the patient's tumor and bilateral neck nodes. I plan to treat to a total dose of 70 Krontz in 35  fractions   IMRT planning Note  IMRT is an important modality to deliver adequate dose to the patient's at risk tissues while sparing the patient's normal structures, including the: esophagus, parotid tissue, mandible, brain stem, spinal cord, oral cavity, brachial plexus.  This justifies the use of IMRT in the patient's treatment.   Special Treatment Procedure Note:  The patient will be receiving chemotherapy concurrently. Chemotherapy heightens the risk of side effects. I have considered this during the patient's treatment planning process and will monitor the patient accordingly for side effects on a weekly basis. Concurrent chemotherapy increases the complexity of this patient's treatment and therefore this constitutes a special treatment procedure.  -----------------------------------  Eppie Gibson, MD

## 2014-05-09 ENCOUNTER — Encounter: Payer: Self-pay | Admitting: Hematology and Oncology

## 2014-05-09 ENCOUNTER — Encounter: Payer: Self-pay | Admitting: *Deleted

## 2014-05-09 NOTE — Progress Notes (Signed)
To provide support, encouragement and continuing education, met with patient during her CT SIM.  After SIM, showed her LINAC 4 area, explained arrival procedure, tmt procedure. She verbalized understanding.  Arranged for cancellation of today's Speech PT to more closely coincide time-wise with another appt at [facility]; tentatively reschedule for 8/10 pending confirmation of PEG/PORT placement surgical date.  Printed her an Retail [employer] calendar for clarification of upcoming appts.  Patient provided me a letter dated 04/25/14 from [place] requesting additional information for extension of short term disability after 05/09/14; I will forward to [employer].  Gayleen Orem, RN, BSN, [facility] Head & Neck Oncology Navigator [phone]

## 2014-05-09 NOTE — Progress Notes (Signed)
Faxed clinical information to [place] @ [phone] for patient's disability

## 2014-05-09 NOTE — Progress Notes (Signed)
Delivered to Carmelina Noun patient's letter from [place] dated 04/25/14 (I rec'd from patient 05/08/14) with request for additional information for extension of Short Term Disability.  Gayleen Orem, RN, BSN, [facility] Head & Neck Oncology Navigator [phone]

## 2014-05-10 ENCOUNTER — Encounter (HOSPITAL_COMMUNITY)
Admission: RE | Admit: 2014-05-10 | Discharge: 2014-05-10 | Disposition: A | Discharge status: Home / Self Care | Payer: BC Managed Care – PPO | Source: Ambulatory Visit | Attending: General Surgery | Admitting: General Surgery

## 2014-05-10 ENCOUNTER — Encounter (HOSPITAL_COMMUNITY): Payer: Self-pay

## 2014-05-10 DIAGNOSIS — Z01818 Encounter for other preprocedural examination: Secondary | ICD-10-CM | POA: Insufficient documentation

## 2014-05-10 DIAGNOSIS — Z01812 Encounter for preprocedural laboratory examination: Secondary | ICD-10-CM | POA: Insufficient documentation

## 2014-05-10 HISTORY — DX: Cough: R05

## 2014-05-10 HISTORY — DX: Nausea with vomiting, unspecified: R11.2

## 2014-05-10 HISTORY — DX: Malignant (primary) neoplasm, unspecified: C80.1

## 2014-05-10 HISTORY — DX: Other specified postprocedural states: Z98.890

## 2014-05-10 HISTORY — DX: Cough, unspecified: R05.9

## 2014-05-10 HISTORY — DX: Unspecified asthma, uncomplicated: J45.909

## 2014-05-10 LAB — BASIC METABOLIC PANEL
Anion gap: 14 (ref 5–15)
BUN: 19 mg/dL (ref 6–23)
CO2: 26 mEq/L (ref 19–32)
CREATININE: 0.78 mg/dL (ref 0.50–1.10)
Calcium: 9.8 mg/dL (ref 8.4–10.5)
Chloride: 101 mEq/L (ref 96–112)
GFR calc non Af Amer: >90 mL/min (ref >90)
Glucose, Bld: 116 mg/dL — ABNORMAL HIGH (ref 70–99)
Potassium: 4.3 mEq/L (ref 3.7–5.3)
Sodium: 141 mEq/L (ref 137–147)

## 2014-05-10 LAB — CBC
HCT: 42.5 % (ref 36.0–46.0)
Hemoglobin: 14.7 g/dL (ref 12.0–15.0)
MCH: 31.5 pg (ref 26.0–34.0)
MCHC: 34.6 g/dL (ref 30.0–36.0)
MCV: 91.2 fL (ref 78.0–100.0)
Platelets: 414 10*3/uL — ABNORMAL HIGH (ref 150–400)
RBC: 4.66 MIL/uL (ref 3.87–5.11)
RDW: 13 % (ref 11.5–15.5)
WBC: 15.4 10*3/uL — ABNORMAL HIGH (ref 4.0–10.5)

## 2014-05-10 MED ORDER — ONDANSETRON HCL 4 MG/2ML IJ SOLN: 4.0000 mg | Freq: Once | INTRAMUSCULAR | Status: AC | PRN

## 2014-05-10 MED ORDER — HYDROMORPHONE HCL PF 1 MG/ML IJ SOLN: 0.2500 mg | INTRAMUSCULAR | Status: DC | PRN

## 2014-05-10 MED ORDER — CHLORHEXIDINE GLUCONATE 4 % EX LIQD: 1.0000 "application " | Freq: Once | CUTANEOUS | Status: DC

## 2014-05-10 MED ORDER — OXYCODONE HCL 5 MG/5ML PO SOLN: 5.0000 mg | Freq: Once | ORAL | Status: AC | PRN

## 2014-05-10 MED ORDER — MEPERIDINE HCL 25 MG/ML IJ SOLN: 6.2500 mg | INTRAMUSCULAR | Status: DC | PRN

## 2014-05-10 MED ORDER — VANCOMYCIN HCL IN DEXTROSE 1-5 GM/200ML-% IV SOLN: 1000.0000 mg | INTRAVENOUS | Status: DC

## 2014-05-10 MED ORDER — OXYCODONE HCL 5 MG PO TABS: 5.0000 mg | ORAL_TABLET | Freq: Once | ORAL | Status: AC | PRN

## 2014-05-10 NOTE — Pre-Procedure Instructions (Signed)
Stacey Carroll  05/10/2014   Your procedure is scheduled on:  05/15/14  Report to [facility] Admitting at 1 pm.  Call this number if you have problems the morning of surgery: [phone]   Remember:   Do not eat food or drink liquids after midnight.   Take these medicines the morning of surgery with A SIP OF WATER: adderall,valium,duragesic,flonase,paxil,roxanol   Do not wear jewelry, make-up or nail polish.  Do not wear lotions, powders, or perfumes. You may wear deodorant.  Do not shave 48 hours prior to surgery. Men may shave face and neck.  Do not bring valuables to the hospital.  [facility] is not responsible                  for any belongings or valuables.               Contacts, dentures or bridgework may not be worn into surgery.  Leave suitcase in the car. After surgery it may be brought to your room.  For patients admitted to the hospital, discharge time is determined by your                treatment team.               Patients discharged the day of surgery will not be allowed to drive  home.  Name and phone number of your driver: family  Special Instructions: Incentive Spirometry - Practice and bring it with you on the day of surgery.   Please read over the following fact sheets that you were given: Pain Booklet, Coughing and Deep Breathing and Surgical Site Infection Prevention

## 2014-05-11 ENCOUNTER — Ambulatory Visit (HOSPITAL_COMMUNITY): Payer: Medicaid - Dental | Admitting: Dentistry

## 2014-05-11 ENCOUNTER — Encounter (HOSPITAL_COMMUNITY): Payer: Self-pay | Admitting: Dentistry

## 2014-05-11 VITALS — BP 112/75 | HR 63 | Temp 97.6°F

## 2014-05-11 DIAGNOSIS — K08409 Partial loss of teeth, unspecified cause, unspecified class: Secondary | ICD-10-CM

## 2014-05-11 DIAGNOSIS — C100 Malignant neoplasm of vallecula: Secondary | ICD-10-CM

## 2014-05-11 DIAGNOSIS — K08109 Complete loss of teeth, unspecified cause, unspecified class: Secondary | ICD-10-CM

## 2014-05-11 DIAGNOSIS — Z0189 Encounter for other specified special examinations: Secondary | ICD-10-CM

## 2014-05-11 DIAGNOSIS — T148XXD Other injury of unspecified body region, subsequent encounter: Secondary | ICD-10-CM

## 2014-05-11 NOTE — Progress Notes (Signed)
POST OPERATIVE NOTE:  05/11/2014 Sunday Shams [id number]  VITALS: BP 112/75  Pulse 63  Temp(Src) 97.6 F (36.4 C) (Oral)  LABS:  Lab Results  Component Value Date   WBC 15.4* 05/10/2014   HGB 14.7 05/10/2014   HCT 42.5 05/10/2014   MCV 91.2 05/10/2014   PLT 414* 05/10/2014   BMET    Component Value Date/Time   NA 141 05/10/2014 1105   NA 140 05/04/2014 1446   K 4.3 05/10/2014 1105   K 4.2 05/04/2014 1446   CL 101 05/10/2014 1105   CO2 26 05/10/2014 1105   CO2 28 05/04/2014 1446   GLUCOSE 116* 05/10/2014 1105   GLUCOSE 91 05/04/2014 1446   BUN 19 05/10/2014 1105   BUN 13.7 05/04/2014 1446   CREATININE 0.78 05/10/2014 1105   CREATININE 0.9 05/04/2014 1446   CALCIUM 9.8 05/10/2014 1105   CALCIUM 10.0 05/04/2014 1446   GFRNONAA >90 05/10/2014 1105   GFRAA >90 05/10/2014 1105    No results found for this basename: INR, PROTIME   No results found for this basename: PTT     BLISS BEHNKE is status post extraction of tooth numbers 18 and 20 with alveoloplasty on 05/02/2014.  SUBJECTIVE: Patient with minimal oral discomfort. Patient indicates a several stitches are still present. Patient is having significant difficulty swallowing and maintaining her weight. Patient indicates that she is currently scheduled for Port-A-Cath and feeding tube placement on this coming Monday. Patient scheduled to start chemoradiation therapy next Wednesday.  EXAM: There is no sign of oral infection, heme, or ooze. Sutures are loosely intact. The extraction site will need to heal in by secondary intention.  There is some food debris in the lower left quadrant and this was irrigated with sterile saline. There is some delayed healing noted. The previous soft tissue defect on the lingual aspect #19 noted at time of surgery has now healed in completely.  PROCEDURE: The patient was given a chlorhexidine gluconate rinse for 30 seconds. Sutures were then removed without complication. Patient tolerated the procedure  well.  ASSESSMENT: Post operative course is consistent with dental procedures performed On 05/02/2014. Delayed healing is noted.   PLAN: 1. Patient will benefit from improved oral hygiene in the lower left quadrant. Patient was given a Monoject syringe and instructed how to irrigate the surgical site with salt water/baking soda rinses. 2. Patient was also instructed to use chlorhexidine rinses twice daily after breakfast and dinner. 3. Patient is to brush after meals and at bedtime. Floss at bedtime. Brush tongue daily.  4. Patient to use fluoride in trays at bedtime. 5. Return to dental medicine in 2-3 weeks for evaluation of healing and to assess how patient is doing with the combined chemoradiation therapy. 6. Patient to call if problems arise before then. 7. Patient was instructed to stop smoking if at all possible.  Lenn Cal, DDS

## 2014-05-11 NOTE — Patient Instructions (Signed)
PLAN: 1. Patient will benefit from improved oral hygiene in the lower left quadrant. Patient was given a Monoject syringe and instructed how to irrigate the surgical site with salt water/baking soda rinses. 2. Patient was also instructed to use chlorhexidine rinses twice daily after breakfast and dinner. 3. Patient is to brush after meals and at bedtime. Floss at bedtime. Brush tongue daily.  4. Patient to use fluoride in trays at bedtime. 5. Return to dental medicine in 2-3 weeks for evaluation of healing and to assess how patient is doing with the combined chemoradiation therapy. 6. Patient to call if problems arise before then. 7. Patient was instructed to stop smoking if at all possible.  Lenn Cal, DDS

## 2014-05-15 ENCOUNTER — Ambulatory Visit (HOSPITAL_COMMUNITY): Payer: BC Managed Care – PPO | Admitting: Anesthesiology

## 2014-05-15 ENCOUNTER — Encounter (HOSPITAL_COMMUNITY)
Admission: RE | Disposition: A | Discharge status: Home / Self Care | Payer: Self-pay | Source: Ambulatory Visit | Attending: General Surgery

## 2014-05-15 ENCOUNTER — Ambulatory Visit: Payer: BC Managed Care – PPO

## 2014-05-15 ENCOUNTER — Encounter (HOSPITAL_COMMUNITY): Payer: BC Managed Care – PPO | Admitting: Anesthesiology

## 2014-05-15 ENCOUNTER — Ambulatory Visit: Payer: Self-pay

## 2014-05-15 ENCOUNTER — Other Ambulatory Visit: Payer: Self-pay

## 2014-05-15 ENCOUNTER — Encounter (HOSPITAL_COMMUNITY): Payer: Self-pay | Admitting: *Deleted

## 2014-05-15 ENCOUNTER — Other Ambulatory Visit (INDEPENDENT_AMBULATORY_CARE_PROVIDER_SITE_OTHER): Payer: Self-pay

## 2014-05-15 ENCOUNTER — Ambulatory Visit (HOSPITAL_COMMUNITY)
Admission: RE | Admit: 2014-05-15 | Discharge: 2014-05-15 | Disposition: A | Discharge status: Home / Self Care | Payer: BC Managed Care – PPO | Source: Ambulatory Visit | Attending: General Surgery | Admitting: General Surgery

## 2014-05-15 DIAGNOSIS — J45909 Unspecified asthma, uncomplicated: Secondary | ICD-10-CM | POA: Insufficient documentation

## 2014-05-15 DIAGNOSIS — I10 Essential (primary) hypertension: Secondary | ICD-10-CM | POA: Insufficient documentation

## 2014-05-15 DIAGNOSIS — F411 Generalized anxiety disorder: Secondary | ICD-10-CM | POA: Insufficient documentation

## 2014-05-15 DIAGNOSIS — F329 Major depressive disorder, single episode, unspecified: Secondary | ICD-10-CM | POA: Insufficient documentation

## 2014-05-15 DIAGNOSIS — K219 Gastro-esophageal reflux disease without esophagitis: Secondary | ICD-10-CM | POA: Insufficient documentation

## 2014-05-15 DIAGNOSIS — C109 Malignant neoplasm of oropharynx, unspecified: Secondary | ICD-10-CM

## 2014-05-15 DIAGNOSIS — F17200 Nicotine dependence, unspecified, uncomplicated: Secondary | ICD-10-CM | POA: Diagnosis not present

## 2014-05-15 DIAGNOSIS — IMO0002 Reserved for concepts with insufficient information to code with codable children: Secondary | ICD-10-CM | POA: Insufficient documentation

## 2014-05-15 DIAGNOSIS — Z800 Family history of malignant neoplasm of digestive organs: Secondary | ICD-10-CM | POA: Insufficient documentation

## 2014-05-15 DIAGNOSIS — Z5309 Procedure and treatment not carried out because of other contraindication: Secondary | ICD-10-CM | POA: Diagnosis present

## 2014-05-15 DIAGNOSIS — M4120 Other idiopathic scoliosis, site unspecified: Secondary | ICD-10-CM | POA: Insufficient documentation

## 2014-05-15 DIAGNOSIS — E7800 Pure hypercholesterolemia, unspecified: Secondary | ICD-10-CM | POA: Diagnosis not present

## 2014-05-15 DIAGNOSIS — F3289 Other specified depressive episodes: Secondary | ICD-10-CM | POA: Insufficient documentation

## 2014-05-15 DIAGNOSIS — Z510 Encounter for antineoplastic radiation therapy: Secondary | ICD-10-CM | POA: Diagnosis not present

## 2014-05-15 DIAGNOSIS — E785 Hyperlipidemia, unspecified: Secondary | ICD-10-CM | POA: Insufficient documentation

## 2014-05-15 DIAGNOSIS — Z79899 Other long term (current) drug therapy: Secondary | ICD-10-CM | POA: Diagnosis not present

## 2014-05-15 DIAGNOSIS — M129 Arthropathy, unspecified: Secondary | ICD-10-CM | POA: Insufficient documentation

## 2014-05-15 SURGERY — INSERTION OF GASTROSTOMY TUBE
Anesthesia: General

## 2014-05-15 MED ORDER — PROPOFOL 10 MG/ML IV BOLUS
INTRAVENOUS | Status: DC | PRN
  Administered 2014-05-15: 110 mg via INTRAVENOUS

## 2014-05-15 MED ORDER — ONDANSETRON HCL 4 MG/2ML IJ SOLN
INTRAMUSCULAR | Status: AC
  Filled 2014-05-15: qty 2

## 2014-05-15 MED ORDER — OXYCODONE HCL 5 MG PO TABS: 5.0000 mg | ORAL_TABLET | Freq: Once | ORAL | Status: DC | PRN

## 2014-05-15 MED ORDER — MIDAZOLAM HCL 2 MG/2ML IJ SOLN
INTRAMUSCULAR | Status: DC | PRN
  Administered 2014-05-15: 1 mg via INTRAVENOUS

## 2014-05-15 MED ORDER — MEPERIDINE HCL 25 MG/ML IJ SOLN: 6.2500 mg | INTRAMUSCULAR | Status: DC | PRN

## 2014-05-15 MED ORDER — MIDAZOLAM HCL 2 MG/2ML IJ SOLN
INTRAMUSCULAR | Status: AC
  Filled 2014-05-15: qty 2

## 2014-05-15 MED ORDER — LACTATED RINGERS IV SOLN
INTRAVENOUS | Status: DC | PRN
  Administered 2014-05-15: 15:00:00 via INTRAVENOUS

## 2014-05-15 MED ORDER — OXYCODONE HCL 5 MG/5ML PO SOLN: 5.0000 mg | Freq: Once | ORAL | Status: DC | PRN

## 2014-05-15 MED ORDER — HEPARIN SOD (PORK) LOCK FLUSH 100 UNIT/ML IV SOLN
INTRAVENOUS | Status: AC
  Filled 2014-05-15: qty 5

## 2014-05-15 MED ORDER — 0.9 % SODIUM CHLORIDE (POUR BTL) OPTIME: TOPICAL | Status: DC | PRN

## 2014-05-15 MED ORDER — ROCURONIUM BROMIDE 50 MG/5ML IV SOLN
INTRAVENOUS | Status: AC
  Filled 2014-05-15: qty 1

## 2014-05-15 MED ORDER — LACTATED RINGERS IV SOLN
INTRAVENOUS | Status: DC
  Administered 2014-05-15: 13:00:00 via INTRAVENOUS

## 2014-05-15 MED ORDER — FENTANYL CITRATE 0.05 MG/ML IJ SOLN
INTRAMUSCULAR | Status: AC
  Filled 2014-05-15: qty 5

## 2014-05-15 MED ORDER — PROPOFOL 10 MG/ML IV BOLUS
INTRAVENOUS | Status: AC
  Filled 2014-05-15: qty 20

## 2014-05-15 MED ORDER — LIDOCAINE HCL (CARDIAC) 20 MG/ML IV SOLN
INTRAVENOUS | Status: AC
  Filled 2014-05-15: qty 5

## 2014-05-15 MED ORDER — PROMETHAZINE HCL 25 MG/ML IJ SOLN: 6.2500 mg | INTRAMUSCULAR | Status: DC | PRN

## 2014-05-15 MED ORDER — MIDAZOLAM HCL 2 MG/2ML IJ SOLN: 0.5000 mg | Freq: Once | INTRAMUSCULAR | Status: DC | PRN

## 2014-05-15 MED ORDER — SUCCINYLCHOLINE CHLORIDE 20 MG/ML IJ SOLN
INTRAMUSCULAR | Status: DC | PRN
  Administered 2014-05-15: 80 mg via INTRAVENOUS

## 2014-05-15 MED ORDER — BUPIVACAINE HCL (PF) 0.25 % IJ SOLN
INTRAMUSCULAR | Status: AC
Start: 2014-05-15 — End: 2014-05-15
  Filled 2014-05-15: qty 30

## 2014-05-15 MED ORDER — LIDOCAINE HCL (CARDIAC) 20 MG/ML IV SOLN
INTRAVENOUS | Status: DC | PRN
  Administered 2014-05-15: 40 mg via INTRAVENOUS

## 2014-05-15 MED ORDER — FENTANYL CITRATE 0.05 MG/ML IJ SOLN: 25.0000 ug | INTRAMUSCULAR | Status: DC | PRN

## 2014-05-15 SURGICAL SUPPLY — 64 items
ADH SKN CLS APL DERMABOND .7 (GAUZE/BANDAGES/DRESSINGS) ×1
BAG DECANTER FOR FLEXI CONT (MISCELLANEOUS) ×1 IMPLANT
BLADE SURG ROTATE 9660 (MISCELLANEOUS) IMPLANT
CANISTER SUCTION 2500CC (MISCELLANEOUS) ×1 IMPLANT
CATH MALECOT BARD  24FR (CATHETERS)
CATH MALECOT BARD 24FR (CATHETERS) IMPLANT
CHLORAPREP W/TINT 10.5 ML (MISCELLANEOUS) ×1 IMPLANT
CHLORAPREP W/TINT 26ML (MISCELLANEOUS) ×1 IMPLANT
COVER MAYO STAND STRL (DRAPES) IMPLANT
COVER SURGICAL LIGHT HANDLE (MISCELLANEOUS) ×4 IMPLANT
CRADLE DONUT ADULT HEAD (MISCELLANEOUS) ×4 IMPLANT
DERMABOND ADVANCED (GAUZE/BANDAGES/DRESSINGS) ×2
DERMABOND ADVANCED .7 DNX12 (GAUZE/BANDAGES/DRESSINGS) ×2 IMPLANT
DRAPE C-ARM 42X72 X-RAY (DRAPES) ×4 IMPLANT
DRAPE CHEST BREAST 15X10 FENES (DRAPES) ×4 IMPLANT
DRAPE LAPAROSCOPIC ABDOMINAL (DRAPES) ×1 IMPLANT
DRAPE UTILITY 15X26 W/TAPE STR (DRAPE) ×8 IMPLANT
ELECT CAUTERY BLADE 6.4 (BLADE) ×1 IMPLANT
ELECT REM PT RETURN 9FT ADLT (ELECTROSURGICAL)
ELECTRODE REM PT RTRN 9FT ADLT (ELECTROSURGICAL) ×1 IMPLANT
GAUZE SPONGE 4X4 12PLY STRL (GAUZE/BANDAGES/DRESSINGS) ×1 IMPLANT
GAUZE SPONGE 4X4 16PLY XRAY LF (GAUZE/BANDAGES/DRESSINGS) ×1 IMPLANT
GLOVE BIO SURGEON STRL SZ7.5 (GLOVE) ×4 IMPLANT
GLOVE BIOGEL PI IND STRL 7.5 (GLOVE) IMPLANT
GLOVE BIOGEL PI INDICATOR 7.5 (GLOVE)
GLOVE ECLIPSE 7.5 STRL STRAW (GLOVE) IMPLANT
GOWN STRL REUS W/ TWL LRG LVL3 (GOWN DISPOSABLE) ×2 IMPLANT
GOWN STRL REUS W/TWL LRG LVL3 (GOWN DISPOSABLE)
INTRODUCER COOK 11FR (CATHETERS) IMPLANT
KIT BASIN OR (CUSTOM PROCEDURE TRAY) ×1 IMPLANT
KIT PORT POWER 8FR ISP CVUE (Catheter) IMPLANT
KIT PORT POWER 9.6FR MRI PREA (Catheter) IMPLANT
KIT PORT POWER ISP 8FR (Catheter) IMPLANT
KIT POWER CATH 8FR (Catheter) IMPLANT
KIT ROOM TURNOVER OR (KITS) ×4 IMPLANT
NDL HYPO 25GX1X1/2 BEV (NEEDLE) ×1 IMPLANT
NEEDLE 22X1 1/2 (OR ONLY) (NEEDLE) IMPLANT
NEEDLE HYPO 25GX1X1/2 BEV (NEEDLE) IMPLANT
NS IRRIG 1000ML POUR BTL (IV SOLUTION) ×1 IMPLANT
PACK GENERAL/GYN (CUSTOM PROCEDURE TRAY) ×1 IMPLANT
PACK SURGICAL SETUP 50X90 (CUSTOM PROCEDURE TRAY) ×1 IMPLANT
PAD ARMBOARD 7.5X6 YLW CONV (MISCELLANEOUS) ×8 IMPLANT
PENCIL BUTTON HOLSTER BLD 10FT (ELECTRODE) ×1 IMPLANT
PLUG CATH AND CAP STER (CATHETERS) ×1 IMPLANT
SET INTRODUCER 12FR PACEMAKER (SHEATH) IMPLANT
SET SHEATH INTRODUCER 10FR (MISCELLANEOUS) IMPLANT
SHEATH COOK PEEL AWAY SET 9F (SHEATH) IMPLANT
STAPLER VISISTAT 35W (STAPLE) ×1 IMPLANT
SUT ETHILON 3 0 FSL (SUTURE) ×1 IMPLANT
SUT MNCRL AB 4-0 PS2 18 (SUTURE) ×1 IMPLANT
SUT PDS AB 1 CTX 36 (SUTURE) ×1 IMPLANT
SUT PROLENE 2 0 SH 30 (SUTURE) ×2 IMPLANT
SUT SILK 2 0 (SUTURE)
SUT SILK 2 0 SH CR/8 (SUTURE) ×2 IMPLANT
SUT SILK 2-0 18XBRD TIE 12 (SUTURE) IMPLANT
SUT VIC AB 3-0 SH 27 (SUTURE)
SUT VIC AB 3-0 SH 27XBRD (SUTURE) ×1 IMPLANT
SYR 20ML ECCENTRIC (SYRINGE) ×2 IMPLANT
SYR 5ML LUER SLIP (SYRINGE) ×1 IMPLANT
SYR CONTROL 10ML LL (SYRINGE) ×1 IMPLANT
SYRINGE 10CC LL (SYRINGE) IMPLANT
TOWEL OR 17X24 6PK STRL BLUE (TOWEL DISPOSABLE) ×1 IMPLANT
TOWEL OR 17X26 10 PK STRL BLUE (TOWEL DISPOSABLE) ×1 IMPLANT
WATER STERILE IRR 1000ML POUR (IV SOLUTION) ×1 IMPLANT

## 2014-05-15 NOTE — Interval H&P Note (Signed)
History and Physical Interval Note:  05/15/2014 2:55 PM  Stacey Carroll  has presented today for surgery, with the diagnosis of oropharyngeal cancer  The various methods of treatment have been discussed with the patient and family. After consideration of risks, benefits and other options for treatment, the patient has consented to  Procedure(s): INSERTION PORT-A-CATH (N/A) GASTROSTOMY/PLACEMENT OF G-TUBE (N/A) as a surgical intervention .  The patient's history has been reviewed, patient examined, no change in status, stable for surgery.  I have reviewed the patient's chart and labs.  Questions were answered to the patient's satisfaction.     TOTH III,PAUL S

## 2014-05-15 NOTE — Anesthesia Postprocedure Evaluation (Signed)
  Anesthesia Post-op Note  Patient: Stacey Carroll  Procedure(s) Performed: Procedure(s): CASE CANCELLED (N/A)  Patient Location: PACU  Anesthesia Type:General  Level of Consciousness: awake, alert , oriented and patient cooperative  Airway and Oxygen Therapy: Patient Spontanous Breathing  Post-op Pain: none  Post-op Assessment: Post-op Vital signs reviewed, Patient's Cardiovascular Status Stable, Respiratory Function Stable, Patent Airway, No signs of Nausea or vomiting and Pain level controlled  Post-op Vital Signs: Reviewed and stable  Last Vitals:  Filed Vitals:   05/15/14 1630  BP:   Pulse: 65  Temp:   Resp: 12    Complications: unable to intubate patient, surgeon to postpone procedure until ENT available for elective trach

## 2014-05-15 NOTE — Op Note (Signed)
05/15/2014  4:20 PM  PATIENT:  Stacey Carroll  53 y.o. female  PRE-OPERATIVE DIAGNOSIS:  oropharyngeal cancer  POST-OPERATIVE DIAGNOSIS:  Same  PROCEDURE:  Procedure(s): INSERTION PORT-A-CATH (N/A)- Aborted GASTROSTOMY/PLACEMENT OF G-TUBE (N/A)- Aborted  SURGEON:  Surgeon(s) and Role:    * Merrie Roof, MD - Primary  PHYSICIAN ASSISTANT:   ASSISTANTS: none   ANESTHESIA:   aborted. could not secure airway  EBL:     BLOOD ADMINISTERED:none  DRAINS: none   LOCAL MEDICATIONS USED:  NONE  SPECIMEN:  No Specimen  DISPOSITION OF SPECIMEN:  N/A  COUNTS:  YES  TOURNIQUET:  * No tourniquets in log *  DICTATION: .Dragon Dictation After informed consent was obtained patient was brought to the operating room and placed in supine position on the operating room table. 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 Anesthesia was unable to reliably place and orotracheal tube because of an inability to properly see. We were able to maintain a good airway. Because we could not place an orotracheal tube the procedure was aborted. The patient will most likely need a tracheostomy prior to any further operative procedures.  PLAN OF CARE: Discharge to home after PACU  PATIENT DISPOSITION:  PACU - hemodynamically stable.   Delay start of Pharmacological VTE agent (>24hrs) due to surgical blood loss or risk of bleeding: not applicable

## 2014-05-15 NOTE — Discharge Instructions (Signed)

## 2014-05-15 NOTE — Anesthesia Procedure Notes (Addendum)
Procedure Name: Intubation Date/Time: 05/15/2014 3:44 PM Performed by: Rebekah Chesterfield L Pre-anesthesia Checklist: Patient identified, Emergency Drugs available, Suction available, Patient being monitored and Timeout performed Patient Re-evaluated:Patient Re-evaluated prior to inductionOxygen Delivery Method: Circle system utilized Preoxygenation: Pre-oxygenation with 100% oxygen Intubation Type: IV induction Ventilation: Mask ventilation without difficulty Number of attempts: 2 Airway Equipment and Method: Video-laryngoscopy and Fiberoptic brochoscope Difficulty Due To: Difficulty was anticipated Comments: Pt brought to OR for port and gtube placement. IV induction, easy mask airway. glidescope gently eased into oropharynx with immediate onset of minor bleeding from vallecular area, epiglottis appeared tattered and immobile. Attempted visualization with fiberoptic scope by Dr. Glennon Mac s success. Due to potential for airway loss, trauma and bleeding it was decided to stop any further attempt at securing the airway and abandon the case. Pt mask assisted until adequate spontaneous TV achieved.

## 2014-05-15 NOTE — Transfer of Care (Signed)
Immediate Anesthesia Transfer of Care Note  Patient: Stacey Carroll  Procedure(s) Performed: Procedure(s): CASE CANCELLED (N/A)  Patient Location: PACU  Anesthesia Type:General  Level of Consciousness: awake, alert , oriented and patient cooperative  Airway & Oxygen Therapy: Patient Spontanous Breathing  Post-op Assessment: Report given to PACU RN, Post -op Vital signs reviewed and stable and Patient moving all extremities  Post vital signs: Reviewed and stable  Complications: No apparent anesthesia complications

## 2014-05-15 NOTE — H&P (View-Only) (Signed)
Patient ID: Stacey Carroll, female   DOB: 21-Jul-1961, 53 y.o.   MRN: [id number]  Chief Complaint  Patient presents with  . eval for pac    HPI ASUSENA Carroll is a 53 y.o. female.  We're asked to see the patient in consultation by Dr. Alvy Bimler to evaluate her for placement of a Port-A-Cath and G-tube. The patient was recently diagnosed with oral pharyngeal cancer. She states that she has been unable to swallow food for the last 5-6 months. She has basically been surviving on pudding.  HPI  Past Medical History  Diagnosis Date  . Visual disturbance 03/11/2013  . Dyslipidemia   . Headache(784.0)   . Depression   . Endometriosis   . Hypertension   . Hypercholesterolemia   . Anxiety   . Scoliosis   . Arthritis   . Throat pain 04/19/2014  . Dysphagia 04/27/2014    Past Surgical History  Procedure Laterality Date  . Cervical laminectomy  2007 (?), 2011  . Shoulder surgery Left   . Abdominal hysterectomy  1987  . Laryngoscopy and esophagoscopy N/A 04/14/2014    Procedure: DIRECT LARYNGOSCOPY WITH BIOPSY  AND ESOPHAGOSCOPY;  Surgeon: Ruby Cola, MD;  Location: [facility] OR;  Service: ENT;  Laterality: N/A;    Family History  Problem Relation Age of Onset  . Emphysema Mother   . Cancer Mother     throat ca  . Cancer Father     prostate ca  . Heart disease Father     Social History History  Substance Use Topics  . Smoking status: Current Every Day Smoker -- 0.50 packs/day for 39 years    Types: Cigarettes  . Smokeless tobacco: Never Used  . Alcohol Use: Yes     Comment: occasional    Allergies  Allergen Reactions  . Penicillins     From childhood    Current Outpatient Prescriptions  Medication Sig Dispense Refill  . amphetamine-dextroamphetamine (ADDERALL XR) 20 MG 24 hr capsule       . diazepam (VALIUM) 10 MG tablet Take 10 mg by mouth every 6 (six) hours as needed for anxiety.      . fentaNYL (DURAGESIC - DOSED MCG/HR) 50 MCG/HR Place 1 patch (50 mcg total) onto the skin  every 3 (three) days.  5 patch  0  . fluticasone (FLONASE) 50 MCG/ACT nasal spray       . ipratropium-albuterol (DUONEB) 0.5-2.5 (3) MG/3ML SOLN       . lidocaine-prilocaine (EMLA) cream Apply 1 application topically as needed.  30 g  6  . morphine (ROXANOL) 20 MG/ML concentrated solution Take 1 mL (20 mg total) by mouth every 2 (two) hours as needed for severe pain.  120 mL  0  . ondansetron (ZOFRAN) 8 MG tablet Take 1 tablet (8 mg total) by mouth every 8 (eight) hours as needed for nausea.  60 tablet  1  . PARoxetine (PAXIL) 20 MG tablet Take 20 mg by mouth daily.      . polyethylene glycol (MIRALAX / GLYCOLAX) packet Take 17 g by mouth daily.  14 each  0  . prochlorperazine (COMPAZINE) 10 MG tablet Take 1 tablet (10 mg total) by mouth every 6 (six) hours as needed (Nausea or vomiting).  60 tablet  1  . sodium fluoride (FLUORISHIELD) 1.1 % GEL dental gel Instill one drop of fluoride per tooth space of fluoride tray. Place over teeth for 5 minutes. Remove. Spit out excess. Repeat nightly.  120 mL  prn  No current facility-administered medications for this visit.    Review of Systems Review of Systems  Constitutional: Negative.   HENT: Positive for trouble swallowing.   Eyes: Negative.   Respiratory: Negative.   Cardiovascular: Negative.   Gastrointestinal: Negative.   Endocrine: Negative.   Genitourinary: Negative.   Musculoskeletal: Negative.   Skin: Negative.   Allergic/Immunologic: Negative.   Neurological: Negative.   Hematological: Negative.   Psychiatric/Behavioral: Negative.     Blood pressure 122/70, pulse 90, temperature 97.9 F (36.6 C), height 5\' 5"  (1.651 m), weight 122 lb (55.339 kg).  Physical Exam Physical Exam  Constitutional: She is oriented to person, place, and time. She appears well-developed and well-nourished.  HENT:  Head: Normocephalic and atraumatic.  Eyes: Conjunctivae and EOM are normal. Pupils are equal, round, and reactive to light.  Neck:  Normal range of motion. Neck supple.  Cardiovascular: Normal rate, regular rhythm and normal heart sounds.   Pulmonary/Chest: Effort normal and breath sounds normal.  Abdominal: Soft. Bowel sounds are normal.  Musculoskeletal: Normal range of motion.  Lymphadenopathy:    She has no cervical adenopathy.  Neurological: She is alert and oriented to person, place, and time.  Skin: Skin is warm and dry.  Psychiatric: She has a normal mood and affect. Her behavior is normal.    Data Reviewed As above  Assessment    The patient has oropharyngeal cancer and requires a Port-A-Cath for delivery of chemotherapy and a G-tube for nutrition. I've discussed with her in detail the risks and benefits of the operation to place both of these as well as some of the technical aspects and she understands and wishes to proceed     Plan    Plan for placement of Port-A-Cath as well as G-tube        TOTH III,Meet [place] S 05/05/2014, 4:40 PM

## 2014-05-15 NOTE — Anesthesia Preprocedure Evaluation (Signed)
Anesthesia Evaluation  Patient identified by MRN, date of birth, ID band Patient awake    Reviewed: Allergy & Precautions, H&P , NPO status , Patient's Chart, lab work & pertinent test results  History of Anesthesia Complications (+) PONV and history of anesthetic complications  Airway Mallampati: II TM Distance: >3 FB Neck ROM: Full  Mouth opening: Limited Mouth Opening  Dental  (+) Poor Dentition, Dental Advisory Given   Pulmonary asthma (last inhaler use a month ago) , Current Smoker,  Oropharyngeal cancer: vallecula and posterior epiglottis, tongue base breath sounds clear to auscultation        Cardiovascular hypertension (off meds for several months), Rhythm:Regular Rate:Normal     Neuro/Psych    GI/Hepatic Neg liver ROS, GERD-  Medicated and Controlled,Difficulty swallowing, on liquid diet secondary to oropharyngeal cancer   Endo/Other  negative endocrine ROS  Renal/GU negative Renal ROS     Musculoskeletal   Abdominal   Peds  Hematology negative hematology ROS (+)   Anesthesia Other Findings   Reproductive/Obstetrics                           Anesthesia Physical Anesthesia Plan  ASA: III  Anesthesia Plan: General   Post-op Pain Management:    Induction: Intravenous  Airway Management Planned: Video Laryngoscope Planned and Oral ETT  Additional Equipment:   Intra-op Plan:   Post-operative Plan: Extubation in OR  Informed Consent: I have reviewed the patients History and Physical, chart, labs and discussed the procedure including the risks, benefits and alternatives for the proposed anesthesia with the patient or authorized representative who has indicated his/her understanding and acceptance.   Dental advisory given  Plan Discussed with: CRNA and Surgeon  Anesthesia Plan Comments: (Plan routine monitors, GETA with careful induction, VideoGlide intubation)         Anesthesia Quick Evaluation

## 2014-05-16 ENCOUNTER — Telehealth: Payer: Self-pay | Admitting: *Deleted

## 2014-05-16 DIAGNOSIS — Z510 Encounter for antineoplastic radiation therapy: Secondary | ICD-10-CM | POA: Diagnosis not present

## 2014-05-16 NOTE — Telephone Encounter (Signed)
Patient's son called asking about tomorrow's appts in context of PAC and PEG not being placed and possible placement of trach.  I confirmed that patient was seen by Dr. Janace Hoard this morning and per his RN, he plans to place a trach; he is contact with Dr. Marlou Starks re: scheduling of procedure.  I requested that Dr. Isidore Moos be notified by Dr. Janace Hoard when procedure is scheduled.  Per my conversation with Dr. Isidore Moos, 1) I will contact Dr. Marlou Starks tomorrow so check on feasibility of PEG and PAC being placed concurrently with trach placement, 2)  I will call son to notify him of RadOnc and MedOnc appt cancellations.   Gayleen Orem, RN, BSN, [place] at [place] [phone]

## 2014-05-17 ENCOUNTER — Ambulatory Visit: Payer: Self-pay

## 2014-05-17 ENCOUNTER — Encounter: Payer: Self-pay | Admitting: *Deleted

## 2014-05-17 ENCOUNTER — Ambulatory Visit
Admission: RE | Admit: 2014-05-17 | Payer: BC Managed Care – PPO | Source: Ambulatory Visit | Admitting: Radiation Oncology

## 2014-05-17 ENCOUNTER — Other Ambulatory Visit: Payer: Self-pay | Admitting: Hematology and Oncology

## 2014-05-17 ENCOUNTER — Ambulatory Visit (HOSPITAL_BASED_OUTPATIENT_CLINIC_OR_DEPARTMENT_OTHER): Payer: BC Managed Care – PPO

## 2014-05-17 ENCOUNTER — Other Ambulatory Visit: Payer: Self-pay | Admitting: Otolaryngology

## 2014-05-17 ENCOUNTER — Ambulatory Visit: Payer: Self-pay | Admitting: Hematology and Oncology

## 2014-05-17 ENCOUNTER — Ambulatory Visit: Payer: BC Managed Care – PPO | Admitting: Nutrition

## 2014-05-17 ENCOUNTER — Encounter (HOSPITAL_COMMUNITY): Payer: Self-pay | Admitting: Pharmacy Technician

## 2014-05-17 ENCOUNTER — Telehealth: Payer: Self-pay | Admitting: Hematology and Oncology

## 2014-05-17 ENCOUNTER — Encounter (HOSPITAL_COMMUNITY): Payer: Self-pay | Admitting: *Deleted

## 2014-05-17 ENCOUNTER — Telehealth: Payer: Self-pay | Admitting: *Deleted

## 2014-05-17 VITALS — BP 117/72 | HR 85 | Temp 98.3°F | Resp 20

## 2014-05-17 DIAGNOSIS — C100 Malignant neoplasm of vallecula: Secondary | ICD-10-CM

## 2014-05-17 MED ORDER — SODIUM CHLORIDE 0.9 % IV SOLN
Freq: Once | INTRAVENOUS | Status: AC
  Administered 2014-05-17: 13:00:00 via INTRAVENOUS

## 2014-05-17 NOTE — Telephone Encounter (Signed)
, °

## 2014-05-17 NOTE — Progress Notes (Signed)
Nutrition followup completed with patient.  Patient is being treated for oropharyngeal cancer.  Patient is scheduled to have a trach and a feeding tube placed in the hospital on Thursday, August 13.  Patient is at risk for refeeding syndrome.  Dietary history reveals patient has been unable to tolerate liquids or solids for "quite some time".  Patient states she drank a 16 ounce protein shake on Tuesday, August 11.  Today, patient has been unable to tolerate any liquids or solids.  Patient complains of increased mucus and pain.  Weight documented as 119.27 on August 10.  This is decreased from 131.06 pounds on July 15 and usual body weight 150 pounds per patient.  Patient meets criteria for severe malnutrition in the context of acute illness secondary to 9% weight loss in less than 3 months and less than 50% of energy intake for greater than 5 days.  Patient has depletion of both body fat and muscle mass on physical exam.  Estimated nutrition needs: 1623-1893 calories, 81-92 g protein, 1.8 L fluid.  Nutrition diagnosis: Unintended weight loss related to inadequate oral intake as evidenced by 12 pound weight loss in less than 3 months.  Intervention: Educated patient on tube feedings once PEG has been placed.  Educated patient on options for bolus feedings and continuous feedings. Recommend Osmolite 1.5 at 20 mL an hour continuous feedings. Recommend close monitoring of labs for refeeding syndrome to include potassium, phosphorus, and magnesium. Eventual goal rate of 50 mL an hour Osmolite 1.5 over 24 hours.  This provides approximately 1775 calories, 75 g protein, 905 mL water.   Recommend RD consult upon admission for tube feeding management and education. Questions answered.  Teach back method used.    Monitoring, evaluation, goals: Patient will tolerate tube feedings without signs of refeeding syndrome to promote weight gain.  Next visit: Monday, August 17 in the infusion  area.  **Disclaimer: This note was dictated with voice recognition software. Similar sounding words can inadvertently be transcribed and this note may contain transcription errors which may not have been corrected upon publication of note.**

## 2014-05-17 NOTE — Telephone Encounter (Signed)
Per staff message and POF I have scheduled appts. Advised scheduler of appts. JMW  

## 2014-05-17 NOTE — Progress Notes (Signed)
Please disregard last note dated 05/17/14 @ 1746.  Charted on wrong patient.

## 2014-05-17 NOTE — Progress Notes (Signed)
Patient did not receive treatment today (05/17/14); Carbo test dose order was released, but med was given and returned to pharmacy [facility]); patient rescheduled to received treatment on 05/18/14, because of longer wait for CMET today, per Dr. Marko Plume; patient arrived late for appointment today; patient verbalized understanding of appointment time for tomorrow.

## 2014-05-17 NOTE — Patient Instructions (Signed)

## 2014-05-18 ENCOUNTER — Ambulatory Visit: Payer: BC Managed Care – PPO

## 2014-05-18 ENCOUNTER — Inpatient Hospital Stay (HOSPITAL_COMMUNITY)
Admission: RE | Admit: 2014-05-18 | Discharge: 2014-05-23 | DRG: 011 | Disposition: A | Discharge status: Home Health | Payer: BC Managed Care – PPO | Source: Ambulatory Visit | Attending: Surgery | Admitting: Surgery

## 2014-05-18 ENCOUNTER — Encounter (HOSPITAL_COMMUNITY): Admission: RE | Disposition: A | Discharge status: Home Health | Payer: Self-pay | Source: Ambulatory Visit

## 2014-05-18 ENCOUNTER — Ambulatory Visit (HOSPITAL_COMMUNITY): Payer: BC Managed Care – PPO

## 2014-05-18 ENCOUNTER — Observation Stay (HOSPITAL_COMMUNITY): Payer: BC Managed Care – PPO

## 2014-05-18 ENCOUNTER — Encounter (HOSPITAL_COMMUNITY): Payer: Self-pay | Admitting: Anesthesiology

## 2014-05-18 ENCOUNTER — Encounter (HOSPITAL_COMMUNITY): Payer: BC Managed Care – PPO | Admitting: Anesthesiology

## 2014-05-18 ENCOUNTER — Ambulatory Visit (HOSPITAL_COMMUNITY): Payer: BC Managed Care – PPO | Admitting: Anesthesiology

## 2014-05-18 DIAGNOSIS — D473 Essential (hemorrhagic) thrombocythemia: Secondary | ICD-10-CM | POA: Diagnosis present

## 2014-05-18 DIAGNOSIS — E7800 Pure hypercholesterolemia, unspecified: Secondary | ICD-10-CM | POA: Diagnosis present

## 2014-05-18 DIAGNOSIS — I10 Essential (primary) hypertension: Secondary | ICD-10-CM | POA: Diagnosis present

## 2014-05-18 DIAGNOSIS — R1313 Dysphagia, pharyngeal phase: Secondary | ICD-10-CM | POA: Diagnosis present

## 2014-05-18 DIAGNOSIS — J45909 Unspecified asthma, uncomplicated: Secondary | ICD-10-CM | POA: Diagnosis present

## 2014-05-18 DIAGNOSIS — Z79899 Other long term (current) drug therapy: Secondary | ICD-10-CM

## 2014-05-18 DIAGNOSIS — IMO0002 Reserved for concepts with insufficient information to code with codable children: Secondary | ICD-10-CM

## 2014-05-18 DIAGNOSIS — Z880 Allergy status to penicillin: Secondary | ICD-10-CM

## 2014-05-18 DIAGNOSIS — Z8249 Family history of ischemic heart disease and other diseases of the circulatory system: Secondary | ICD-10-CM

## 2014-05-18 DIAGNOSIS — D649 Anemia, unspecified: Secondary | ICD-10-CM | POA: Diagnosis present

## 2014-05-18 DIAGNOSIS — E43 Unspecified severe protein-calorie malnutrition: Secondary | ICD-10-CM | POA: Diagnosis present

## 2014-05-18 DIAGNOSIS — F17200 Nicotine dependence, unspecified, uncomplicated: Secondary | ICD-10-CM | POA: Diagnosis present

## 2014-05-18 DIAGNOSIS — Z8042 Family history of malignant neoplasm of prostate: Secondary | ICD-10-CM

## 2014-05-18 DIAGNOSIS — F4321 Adjustment disorder with depressed mood: Secondary | ICD-10-CM | POA: Diagnosis present

## 2014-05-18 DIAGNOSIS — E785 Hyperlipidemia, unspecified: Secondary | ICD-10-CM | POA: Diagnosis present

## 2014-05-18 DIAGNOSIS — C100 Malignant neoplasm of vallecula: Secondary | ICD-10-CM

## 2014-05-18 DIAGNOSIS — K5909 Other constipation: Secondary | ICD-10-CM | POA: Diagnosis present

## 2014-05-18 DIAGNOSIS — Z800 Family history of malignant neoplasm of digestive organs: Secondary | ICD-10-CM

## 2014-05-18 DIAGNOSIS — Z836 Family history of other diseases of the respiratory system: Secondary | ICD-10-CM

## 2014-05-18 HISTORY — PX: PORTACATH PLACEMENT: SHX2246

## 2014-05-18 HISTORY — PX: TRACHEOSTOMY TUBE PLACEMENT: SHX814

## 2014-05-18 HISTORY — PX: GASTROSTOMY: SHX5249

## 2014-05-18 LAB — POCT I-STAT 4, (NA,K, GLUC, HGB,HCT)
Glucose, Bld: 71 mg/dL (ref 70–99)
HCT: 42 % (ref 36.0–46.0)
HEMOGLOBIN: 14.3 g/dL (ref 12.0–15.0)
Potassium: 3.6 mEq/L — ABNORMAL LOW (ref 3.7–5.3)
Sodium: 139 mEq/L (ref 137–147)

## 2014-05-18 LAB — CBC
HCT: 42.6 % (ref 36.0–46.0)
HEMOGLOBIN: 14.4 g/dL (ref 12.0–15.0)
MCH: 30.8 pg (ref 26.0–34.0)
MCHC: 33.8 g/dL (ref 30.0–36.0)
MCV: 91.2 fL (ref 78.0–100.0)
PLATELETS: 353 10*3/uL (ref 150–400)
RBC: 4.67 MIL/uL (ref 3.87–5.11)
RDW: 13.1 % (ref 11.5–15.5)
WBC: 22 10*3/uL — AB (ref 4.0–10.5)

## 2014-05-18 LAB — MRSA PCR SCREENING: MRSA by PCR: NEGATIVE

## 2014-05-18 SURGERY — INSERTION, TUNNELED CENTRAL VENOUS DEVICE, WITH PORT
Anesthesia: Monitor Anesthesia Care | Site: Neck

## 2014-05-18 MED ORDER — CHLORHEXIDINE GLUCONATE 0.12 % MT SOLN
15.0000 mL | Freq: Two times a day (BID) | OROMUCOSAL | Status: DC
  Administered 2014-05-19 – 2014-05-23 (×9): 15 mL via OROMUCOSAL
  Filled 2014-05-18 (×11): qty 15

## 2014-05-18 MED ORDER — MORPHINE SULFATE 2 MG/ML IJ SOLN
2.0000 mg | INTRAMUSCULAR | Status: DC | PRN
  Administered 2014-05-18 – 2014-05-22 (×5): 2 mg via INTRAVENOUS
  Filled 2014-05-18 (×5): qty 1

## 2014-05-18 MED ORDER — ONDANSETRON HCL 4 MG/2ML IJ SOLN
4.0000 mg | Freq: Four times a day (QID) | INTRAMUSCULAR | Status: DC | PRN
Start: 2014-05-18 — End: 2014-05-18

## 2014-05-18 MED ORDER — MORPHINE SULFATE (CONCENTRATE) 10 MG /0.5 ML PO SOLN: 20.0000 mg | ORAL | Status: DC | PRN

## 2014-05-18 MED ORDER — PANTOPRAZOLE SODIUM 40 MG IV SOLR
40.0000 mg | Freq: Every day | INTRAVENOUS | Status: DC
  Administered 2014-05-19 (×2): 40 mg via INTRAVENOUS
  Filled 2014-05-18 (×3): qty 40

## 2014-05-18 MED ORDER — FENTANYL CITRATE 0.05 MG/ML IJ SOLN
INTRAMUSCULAR | Status: AC
  Filled 2014-05-18: qty 5

## 2014-05-18 MED ORDER — BUPIVACAINE HCL (PF) 0.25 % IJ SOLN
INTRAMUSCULAR | Status: DC | PRN
  Administered 2014-05-18: 10 mL

## 2014-05-18 MED ORDER — LIDOCAINE HCL 1 % IJ SOLN
INTRAMUSCULAR | Status: DC | PRN
  Administered 2014-05-18: 2 mL

## 2014-05-18 MED ORDER — MORPHINE SULFATE (CONCENTRATE) 20 MG/ML PO SOLN
20.0000 mg | ORAL | Status: DC | PRN
Start: 2014-05-18 — End: 2014-05-18

## 2014-05-18 MED ORDER — BUPIVACAINE HCL (PF) 0.25 % IJ SOLN
INTRAMUSCULAR | Status: AC
Start: 2014-05-18 — End: 2014-05-18
  Filled 2014-05-18: qty 30

## 2014-05-18 MED ORDER — SUCCINYLCHOLINE CHLORIDE 20 MG/ML IJ SOLN
INTRAMUSCULAR | Status: DC | PRN
  Administered 2014-05-18: 100 mg via INTRAVENOUS

## 2014-05-18 MED ORDER — MIDAZOLAM HCL 5 MG/5ML IJ SOLN
INTRAMUSCULAR | Status: DC | PRN
Start: 2014-05-18 — End: 2014-05-18
  Administered 2014-05-18: 1 mg via INTRAVENOUS

## 2014-05-18 MED ORDER — PROPOFOL 10 MG/ML IV BOLUS
INTRAVENOUS | Status: DC | PRN
Start: 2014-05-18 — End: 2014-05-18
  Administered 2014-05-18: 40 mg via INTRAVENOUS
  Administered 2014-05-18: 20 mg via INTRAVENOUS
  Administered 2014-05-18: 40 mg via INTRAVENOUS

## 2014-05-18 MED ORDER — SODIUM CHLORIDE 0.9 % IJ SOLN: 9.0000 mL | INTRAMUSCULAR | Status: DC | PRN

## 2014-05-18 MED ORDER — LIDOCAINE HCL (PF) 1 % IJ SOLN
INTRAMUSCULAR | Status: AC
  Filled 2014-05-18: qty 30

## 2014-05-18 MED ORDER — LIDOCAINE-EPINEPHRINE 1 %-1:100000 IJ SOLN
INTRAMUSCULAR | Status: DC | PRN
  Administered 2014-05-18: 8 mL

## 2014-05-18 MED ORDER — ROCURONIUM BROMIDE 50 MG/5ML IV SOLN
INTRAVENOUS | Status: AC
  Filled 2014-05-18: qty 1

## 2014-05-18 MED ORDER — GLYCOPYRROLATE 0.2 MG/ML IJ SOLN
INTRAMUSCULAR | Status: DC | PRN
  Administered 2014-05-18: 0.6 mg via INTRAVENOUS
  Administered 2014-05-18: 0.2 mg via INTRAVENOUS

## 2014-05-18 MED ORDER — ESMOLOL HCL 10 MG/ML IV SOLN
INTRAVENOUS | Status: DC | PRN
  Administered 2014-05-18: 10 mg via INTRAVENOUS
  Administered 2014-05-18: 20 mg via INTRAVENOUS

## 2014-05-18 MED ORDER — LACTATED RINGERS IV SOLN
INTRAVENOUS | Status: DC
  Administered 2014-05-18: 08:00:00 via INTRAVENOUS

## 2014-05-18 MED ORDER — ONDANSETRON HCL 4 MG/2ML IJ SOLN
INTRAMUSCULAR | Status: DC | PRN
Start: 2014-05-18 — End: 2014-05-18
  Administered 2014-05-18: 4 mg via INTRAVENOUS

## 2014-05-18 MED ORDER — ACETAMINOPHEN 650 MG RE SUPP: 650.0000 mg | Freq: Four times a day (QID) | RECTAL | Status: DC | PRN

## 2014-05-18 MED ORDER — CETYLPYRIDINIUM CHLORIDE 0.05 % MT LIQD
7.0000 mL | Freq: Two times a day (BID) | OROMUCOSAL | Status: DC
  Administered 2014-05-18 – 2014-05-22 (×7): 7 mL via OROMUCOSAL

## 2014-05-18 MED ORDER — ONDANSETRON HCL 4 MG/2ML IJ SOLN
INTRAMUSCULAR | Status: AC
  Administered 2014-05-18: 4 mg via INTRAVENOUS
  Filled 2014-05-18: qty 2

## 2014-05-18 MED ORDER — NEOSTIGMINE METHYLSULFATE 10 MG/10ML IV SOLN
INTRAVENOUS | Status: DC | PRN
  Administered 2014-05-18: 4 mg via INTRAVENOUS

## 2014-05-18 MED ORDER — DIPHENHYDRAMINE HCL 50 MG/ML IJ SOLN: 12.5000 mg | Freq: Four times a day (QID) | INTRAMUSCULAR | Status: DC | PRN

## 2014-05-18 MED ORDER — MIDAZOLAM HCL 2 MG/2ML IJ SOLN
INTRAMUSCULAR | Status: AC
  Filled 2014-05-18: qty 2

## 2014-05-18 MED ORDER — FLUTICASONE PROPIONATE 50 MCG/ACT NA SUSP
2.0000 | Freq: Every day | NASAL | Status: DC | PRN
Start: 2014-05-18 — End: 2014-05-23

## 2014-05-18 MED ORDER — NALOXONE HCL 0.4 MG/ML IJ SOLN: 0.4000 mg | INTRAMUSCULAR | Status: DC | PRN

## 2014-05-18 MED ORDER — GLYCOPYRROLATE 0.2 MG/ML IJ SOLN
INTRAMUSCULAR | Status: AC
  Filled 2014-05-18: qty 2

## 2014-05-18 MED ORDER — HEPARIN SOD (PORK) LOCK FLUSH 100 UNIT/ML IV SOLN
INTRAVENOUS | Status: DC | PRN
  Administered 2014-05-18: 500 [IU]

## 2014-05-18 MED ORDER — FENTANYL CITRATE 0.05 MG/ML IJ SOLN
INTRAMUSCULAR | Status: DC | PRN
  Administered 2014-05-18: 50 ug via INTRAVENOUS
  Administered 2014-05-18 (×3): 25 ug via INTRAVENOUS
  Administered 2014-05-18 (×2): 50 ug via INTRAVENOUS
  Administered 2014-05-18: 25 ug via INTRAVENOUS
  Administered 2014-05-18: 50 ug via INTRAVENOUS

## 2014-05-18 MED ORDER — ARTIFICIAL TEARS OP OINT
TOPICAL_OINTMENT | OPHTHALMIC | Status: AC
  Filled 2014-05-18: qty 3.5

## 2014-05-18 MED ORDER — LACTATED RINGERS IV SOLN
INTRAVENOUS | Status: DC | PRN
  Administered 2014-05-18 (×2): via INTRAVENOUS

## 2014-05-18 MED ORDER — DIPHENHYDRAMINE HCL 12.5 MG/5ML PO ELIX
12.5000 mg | ORAL_SOLUTION | Freq: Four times a day (QID) | ORAL | Status: DC | PRN
  Filled 2014-05-18: qty 5

## 2014-05-18 MED ORDER — MORPHINE SULFATE (PF) 1 MG/ML IV SOLN
INTRAVENOUS | Status: DC
  Administered 2014-05-18: 6 mg via INTRAVENOUS
  Administered 2014-05-18: 16:00:00 via INTRAVENOUS
  Administered 2014-05-19 (×2): 1.5 mg via INTRAVENOUS
  Administered 2014-05-19: 4.5 mg via INTRAVENOUS
  Administered 2014-05-19: 3 mg via INTRAVENOUS
  Administered 2014-05-19: 1.5 mg via INTRAVENOUS
  Administered 2014-05-20: 3 mg via INTRAVENOUS
  Filled 2014-05-18: qty 25

## 2014-05-18 MED ORDER — LIDOCAINE-EPINEPHRINE 1 %-1:100000 IJ SOLN
INTRAMUSCULAR | Status: AC
  Filled 2014-05-18: qty 1

## 2014-05-18 MED ORDER — MORPHINE SULFATE (PF) 1 MG/ML IV SOLN
INTRAVENOUS | Status: AC
  Administered 2014-05-19: 1.5 mg
  Filled 2014-05-18: qty 25

## 2014-05-18 MED ORDER — ROCURONIUM BROMIDE 100 MG/10ML IV SOLN
INTRAVENOUS | Status: DC | PRN
  Administered 2014-05-18: 25 mg via INTRAVENOUS

## 2014-05-18 MED ORDER — CIPROFLOXACIN IN D5W 400 MG/200ML IV SOLN
400.0000 mg | INTRAVENOUS | Status: AC
  Administered 2014-05-18: 400 mg via INTRAVENOUS
  Filled 2014-05-18: qty 200

## 2014-05-18 MED ORDER — PAROXETINE HCL 10 MG PO TABS
10.0000 mg | ORAL_TABLET | Freq: Every day | ORAL | Status: DC
  Administered 2014-05-19 – 2014-05-23 (×5): 10 mg
  Filled 2014-05-18 (×5): qty 1

## 2014-05-18 MED ORDER — HYDROMORPHONE HCL PF 1 MG/ML IJ SOLN
0.2500 mg | INTRAMUSCULAR | Status: DC | PRN
  Administered 2014-05-18 (×2): 0.5 mg via INTRAVENOUS

## 2014-05-18 MED ORDER — HEPARIN SOD (PORK) LOCK FLUSH 100 UNIT/ML IV SOLN
INTRAVENOUS | Status: AC
  Filled 2014-05-18: qty 5

## 2014-05-18 MED ORDER — SODIUM CHLORIDE 0.9 % IR SOLN
Status: DC | PRN
  Administered 2014-05-18: 11:00:00

## 2014-05-18 MED ORDER — PROPOFOL 10 MG/ML IV BOLUS
INTRAVENOUS | Status: AC
  Filled 2014-05-18: qty 20

## 2014-05-18 MED ORDER — NEOSTIGMINE METHYLSULFATE 10 MG/10ML IV SOLN
INTRAVENOUS | Status: AC
  Filled 2014-05-18: qty 1

## 2014-05-18 MED ORDER — DIAZEPAM 5 MG PO TABS
5.0000 mg | ORAL_TABLET | Freq: Four times a day (QID) | ORAL | Status: DC | PRN
  Administered 2014-05-19: 5 mg via ORAL
  Filled 2014-05-18: qty 1

## 2014-05-18 MED ORDER — PAROXETINE HCL 10 MG/5ML PO SUSP: 20.0000 mg | ORAL | Status: DC

## 2014-05-18 MED ORDER — SODIUM CHLORIDE 0.9 % IV SOLN
INTRAVENOUS | Status: DC
  Administered 2014-05-18 – 2014-05-19 (×3): via INTRAVENOUS

## 2014-05-18 MED ORDER — ENOXAPARIN SODIUM 40 MG/0.4ML ~~LOC~~ SOLN
40.0000 mg | SUBCUTANEOUS | Status: DC
  Administered 2014-05-19 – 2014-05-23 (×5): 40 mg via SUBCUTANEOUS
  Filled 2014-05-18 (×7): qty 0.4

## 2014-05-18 MED ORDER — ONDANSETRON HCL 4 MG/2ML IJ SOLN
INTRAMUSCULAR | Status: AC
  Filled 2014-05-18: qty 2

## 2014-05-18 MED ORDER — ACETAMINOPHEN 325 MG PO TABS: 650.0000 mg | ORAL_TABLET | Freq: Four times a day (QID) | ORAL | Status: DC | PRN

## 2014-05-18 MED ORDER — IPRATROPIUM-ALBUTEROL 0.5-2.5 (3) MG/3ML IN SOLN
3.0000 mL | RESPIRATORY_TRACT | Status: DC | PRN
  Administered 2014-05-19: 3 mL via RESPIRATORY_TRACT
  Filled 2014-05-18: qty 3

## 2014-05-18 MED ORDER — HYDROMORPHONE HCL PF 1 MG/ML IJ SOLN
INTRAMUSCULAR | Status: AC
  Filled 2014-05-18: qty 1

## 2014-05-18 MED ORDER — PHENYLEPHRINE HCL 10 MG/ML IJ SOLN
INTRAMUSCULAR | Status: DC | PRN
  Administered 2014-05-18 (×5): 80 ug via INTRAVENOUS

## 2014-05-18 MED ORDER — ONDANSETRON HCL 4 MG/2ML IJ SOLN
4.0000 mg | Freq: Four times a day (QID) | INTRAMUSCULAR | Status: DC | PRN
  Administered 2014-05-19: 4 mg via INTRAVENOUS
  Filled 2014-05-18: qty 2

## 2014-05-18 MED ORDER — ONDANSETRON HCL 4 MG/2ML IJ SOLN
4.0000 mg | Freq: Once | INTRAMUSCULAR | Status: AC
  Administered 2014-05-18: 4 mg via INTRAVENOUS

## 2014-05-18 MED ORDER — MORPHINE SULFATE 2 MG/ML IJ SOLN
INTRAMUSCULAR | Status: AC
  Administered 2014-05-18: 2 mg via INTRAVENOUS
  Filled 2014-05-18: qty 1

## 2014-05-18 SURGICAL SUPPLY — 95 items
ADH SKN CLS APL DERMABOND .7 (GAUZE/BANDAGES/DRESSINGS) ×3
BAG DECANTER FOR FLEXI CONT (MISCELLANEOUS) ×5 IMPLANT
BLADE 10 SAFETY STRL DISP (BLADE) ×3 IMPLANT
BLADE 11 SAFETY STRL DISP (BLADE) IMPLANT
BLADE 15 SAFETY STRL DISP (BLADE) ×3 IMPLANT
BLADE SURG 11 STRL SS (BLADE) ×5 IMPLANT
BLADE SURG 15 STRL LF DISP TIS (BLADE) ×3 IMPLANT
BLADE SURG 15 STRL SS (BLADE) ×5
BLADE SURG ROTATE 9660 (MISCELLANEOUS) IMPLANT
CANISTER SUCTION 2500CC (MISCELLANEOUS) ×8 IMPLANT
CATH ROBINSON RED A/P 16FR (CATHETERS) IMPLANT
CATH ROBINSON RED A/P 18FR (CATHETERS) IMPLANT
CATH ROBINSON RED A/P 20FR (CATHETERS) IMPLANT
CHLORAPREP W/TINT 26ML (MISCELLANEOUS) ×5 IMPLANT
CLEANER TIP ELECTROSURG 2X2 (MISCELLANEOUS) ×7 IMPLANT
CLOSURE WOUND 1/2 X4 (GAUZE/BANDAGES/DRESSINGS)
COVER SURGICAL LIGHT HANDLE (MISCELLANEOUS) ×10 IMPLANT
CRADLE DONUT ADULT HEAD (MISCELLANEOUS) ×5 IMPLANT
DECANTER SPIKE VIAL GLASS SM (MISCELLANEOUS) ×5 IMPLANT
DERMABOND ADVANCED (GAUZE/BANDAGES/DRESSINGS) ×2
DERMABOND ADVANCED .7 DNX12 (GAUZE/BANDAGES/DRESSINGS) ×3 IMPLANT
DRAPE C-ARM 42X72 X-RAY (DRAPES) ×5 IMPLANT
DRAPE LAPAROSCOPIC ABDOMINAL (DRAPES) ×3 IMPLANT
DRAPE ORTHO SPLIT 77X108 STRL (DRAPES) ×10
DRAPE SURG ORHT 6 SPLT 77X108 (DRAPES) IMPLANT
DRAPE WARM FLUID 44X44 (DRAPE) ×3 IMPLANT
ELECT CAUTERY BLADE 6.4 (BLADE) ×7 IMPLANT
ELECT COATED BLADE 2.86 ST (ELECTRODE) ×5 IMPLANT
ELECT REM PT RETURN 9FT ADLT (ELECTROSURGICAL) ×10
ELECTRODE REM PT RTRN 9FT ADLT (ELECTROSURGICAL) ×6 IMPLANT
GAUZE SPONGE 4X4 12PLY STRL (GAUZE/BANDAGES/DRESSINGS) ×5 IMPLANT
GAUZE SPONGE 4X4 16PLY XRAY LF (GAUZE/BANDAGES/DRESSINGS) ×8 IMPLANT
GLOVE BIO SURGEON STRL SZ7 (GLOVE) ×10 IMPLANT
GLOVE BIOGEL PI IND STRL 7.0 (GLOVE) IMPLANT
GLOVE BIOGEL PI IND STRL 7.5 (GLOVE) ×3 IMPLANT
GLOVE BIOGEL PI INDICATOR 7.0 (GLOVE) ×6
GLOVE BIOGEL PI INDICATOR 7.5 (GLOVE) ×2
GLOVE ECLIPSE 7.5 STRL STRAW (GLOVE) ×12 IMPLANT
GLOVE SURG SS PI 7.0 STRL IVOR (GLOVE) ×4 IMPLANT
GOWN STRL REUS W/ TWL LRG LVL3 (GOWN DISPOSABLE) ×12 IMPLANT
GOWN STRL REUS W/TWL LRG LVL3 (GOWN DISPOSABLE) ×15
INTRODUCER COOK 11FR (CATHETERS) IMPLANT
KIT BASIN OR (CUSTOM PROCEDURE TRAY) ×10 IMPLANT
KIT PORT POWER 8FR ISP CVUE (Catheter) ×2 IMPLANT
KIT PORT POWER 9.6FR MRI PREA (Catheter) IMPLANT
KIT PORT POWER ISP 8FR (Catheter) IMPLANT
KIT POWER CATH 8FR (Catheter) IMPLANT
KIT ROOM TURNOVER OR (KITS) ×8 IMPLANT
NDL HYPO 25GX1X1/2 BEV (NEEDLE) ×6 IMPLANT
NEEDLE HYPO 25GX1X1/2 BEV (NEEDLE) ×10 IMPLANT
NS IRRIG 1000ML POUR BTL (IV SOLUTION) ×13 IMPLANT
PACK GENERAL/GYN (CUSTOM PROCEDURE TRAY) ×5 IMPLANT
PACK SURGICAL SETUP 50X90 (CUSTOM PROCEDURE TRAY) ×3 IMPLANT
PAD ARMBOARD 7.5X6 YLW CONV (MISCELLANEOUS) ×20 IMPLANT
PENCIL BUTTON HOLSTER BLD 10FT (ELECTRODE) ×7 IMPLANT
PENCIL FOOT CONTROL (ELECTRODE) ×5 IMPLANT
SET INTRODUCER 12FR PACEMAKER (SHEATH) IMPLANT
SET SHEATH INTRODUCER 10FR (MISCELLANEOUS) IMPLANT
SHEATH COOK PEEL AWAY SET 9F (SHEATH) IMPLANT
SPONGE DRAIN TRACH 4X4 STRL 2S (GAUZE/BANDAGES/DRESSINGS) ×4 IMPLANT
SPONGE GAUZE 4X4 12PLY STER LF (GAUZE/BANDAGES/DRESSINGS) ×2 IMPLANT
STAPLER VISISTAT 35W (STAPLE) ×2 IMPLANT
STRIP CLOSURE SKIN 1/2X4 (GAUZE/BANDAGES/DRESSINGS) IMPLANT
SURGILUBE 2OZ TUBE FLIPTOP (MISCELLANEOUS) IMPLANT
SURGILUBE 3G PEEL PACK STRL (MISCELLANEOUS) ×2 IMPLANT
SUT CHROMIC GUT 2 0 PS 2 27 (SUTURE) ×5 IMPLANT
SUT ETHILON 2 0 FS 18 (SUTURE) ×5 IMPLANT
SUT ETHILON 3 0 PS 1 (SUTURE) ×5 IMPLANT
SUT MNCRL AB 4-0 PS2 18 (SUTURE) ×5 IMPLANT
SUT PDS AB 1 CTX 36 (SUTURE) ×4 IMPLANT
SUT PROLENE 2 0 SH DA (SUTURE) ×7 IMPLANT
SUT SILK 2 0 (SUTURE) ×5
SUT SILK 2 0 SH CR/8 (SUTURE) ×5 IMPLANT
SUT SILK 2-0 18XBRD TIE 12 (SUTURE) IMPLANT
SUT SILK 3 0 SH 30 (SUTURE) ×5 IMPLANT
SUT SILK 3 0 TIES 17X18 (SUTURE) ×5
SUT SILK 3-0 18XBRD TIE BLK (SUTURE) ×3 IMPLANT
SUT VIC AB 3-0 SH 27 (SUTURE) ×5
SUT VIC AB 3-0 SH 27XBRD (SUTURE) ×3 IMPLANT
SYR 20ML ECCENTRIC (SYRINGE) ×10 IMPLANT
SYR 5ML LUER SLIP (SYRINGE) ×5 IMPLANT
SYR CONTROL 10ML LL (SYRINGE) ×7 IMPLANT
SYRINGE 10CC LL (SYRINGE) ×5 IMPLANT
SYRINGE CONTROL L 12CC (SYRINGE) ×10 IMPLANT
SYRINGE CONTROL LL 12CC (SYRINGE) IMPLANT
SYRINGE IRR TOOMEY STRL 70CC (SYRINGE) ×2 IMPLANT
TOWEL OR 17X24 6PK STRL BLUE (TOWEL DISPOSABLE) ×12 IMPLANT
TOWEL OR 17X26 10 PK STRL BLUE (TOWEL DISPOSABLE) ×8 IMPLANT
TRAY ENT MC OR (CUSTOM PROCEDURE TRAY) ×5 IMPLANT
TRAY FOLEY CATH 16FRSI W/METER (SET/KITS/TRAYS/PACK) ×3 IMPLANT
TUBE CONNECTING 12'X1/4 (SUCTIONS)
TUBE CONNECTING 12X1/4 (SUCTIONS) ×3 IMPLANT
TUBE GASTROSTOMY 18F (CATHETERS) ×2 IMPLANT
WATER STERILE IRR 1000ML POUR (IV SOLUTION) ×5 IMPLANT
YANKAUER SUCT BULB TIP NO VENT (SUCTIONS) ×2 IMPLANT

## 2014-05-18 NOTE — Progress Notes (Addendum)
In support of care continuity, met with Stacey Carroll and her son [name] during nutritional appt with Dory Peru.  1)  Patient agreed to begin continuous flow nutritional adminstration after she is DC'd s/p trach/peg/port placement which is scheduled for tomorrow.  2)  She denied needing additional liquid formulation of medications other than the Paxil which is currently taking. 3)  In response to Barb's inquiry, Kyrstyn expressed interest in receiving IVF d/t limited oral intake/hydration.  I facilitated this request with Dr. Alvy Bimler, escorted [name] to Registration and  Infusion after appt with [facility].  I checked on her later and she reported that the fluids were helping her feel better, she was eating chips as we spoke.  Continuing navigation as L1 patient (new patient).  Gayleen Orem, RN, BSN, [place] at [place] [phone]

## 2014-05-18 NOTE — Transfer of Care (Signed)
Immediate Anesthesia Transfer of Care Note  Patient: Stacey Carroll  Procedure(s) Performed: Procedure(s): INSERTION PORT-A-CATH (N/A) GASTROSTOMY/PLACEMENT OF G-TUBE (N/A) TRACHEOSTOMY (N/A)  Patient Location: PACU  Anesthesia Type:General  Level of Consciousness: awake and alert   Airway & Oxygen Therapy: Patient Spontanous Breathing and Patient connected to tracheostomy mask oxygen  Post-op Assessment: Report given to PACU RN  Post vital signs: Reviewed and stable  Complications: No apparent anesthesia complications

## 2014-05-18 NOTE — Anesthesia Postprocedure Evaluation (Signed)
  Anesthesia Post-op Note  Patient: Stacey Carroll  Procedure(s) Performed: Procedure(s): INSERTION PORT-A-CATH (N/A) GASTROSTOMY/PLACEMENT OF G-TUBE (N/A) TRACHEOSTOMY (N/A)  Patient Location: PACU  Anesthesia Type:General  Level of Consciousness: awake  Airway and Oxygen Therapy: Patient Spontanous Breathing  Post-op Pain: mild  Post-op Assessment: Post-op Vital signs reviewed  Post-op Vital Signs: Reviewed  Last Vitals:  Filed Vitals:   05/18/14 1315  BP: 164/90  Pulse: 78  Temp: 36.5 C  Resp: 13    Complications: No apparent anesthesia complications

## 2014-05-18 NOTE — Interval H&P Note (Signed)
History and Physical Interval Note:  05/18/2014 9:07 AM  Stacey Carroll  has presented today for surgery, with the diagnosis of oropharyngeal cancer  The various methods of treatment have been discussed with the patient and family. After consideration of risks, benefits and other options for treatment, the patient has consented to  Procedure(s): INSERTION PORT-A-CATH (N/A) GASTROSTOMY/PLACEMENT OF G-TUBE (N/A) TRACHEOSTOMY (N/A) as a surgical intervention .  The patient's history has been reviewed, patient examined, no change in status, stable for surgery.  I have reviewed the patient's chart and labs.  Questions were answered to the patient's satisfaction.     Kleigh Hoelzer

## 2014-05-18 NOTE — H&P (View-Only) (Signed)
Patient ID: Stacey Carroll, female   DOB: [birth year], 53 y.o.   MRN: [id number]  Chief Complaint  Patient presents with  . eval for pac    HPI Stacey Carroll is a 53 y.o. female.  We're asked to see the patient in consultation by Dr. Alvy Bimler to evaluate her for placement of a Port-A-Cath and G-tube. The patient was recently diagnosed with oral pharyngeal cancer. She states that she has been unable to swallow food for the last 5-6 months. She has basically been surviving on pudding.  HPI  Past Medical History  Diagnosis Date  . Visual disturbance 03/11/2013  . Dyslipidemia   . Headache(784.0)   . Depression   . Endometriosis   . Hypertension   . Hypercholesterolemia   . Anxiety   . Scoliosis   . Arthritis   . Throat pain 04/19/2014  . Dysphagia 04/27/2014    Past Surgical History  Procedure Laterality Date  . Cervical laminectomy  2007 (?), 2011  . Shoulder surgery Left   . Abdominal hysterectomy  1987  . Laryngoscopy and esophagoscopy N/A 04/14/2014    Procedure: DIRECT LARYNGOSCOPY WITH BIOPSY  AND ESOPHAGOSCOPY;  Surgeon: Ruby Cola, MD;  Location: [facility] OR;  Service: ENT;  Laterality: N/A;    Family History  Problem Relation Age of Onset  . Emphysema Mother   . Cancer Mother     throat ca  . Cancer Father     prostate ca  . Heart disease Father     Social History History  Substance Use Topics  . Smoking status: Current Every Day Smoker -- 0.50 packs/day for 39 years    Types: Cigarettes  . Smokeless tobacco: Never Used  . Alcohol Use: Yes     Comment: occasional    Allergies  Allergen Reactions  . Penicillins     From childhood    Current Outpatient Prescriptions  Medication Sig Dispense Refill  . amphetamine-dextroamphetamine (ADDERALL XR) 20 MG 24 hr capsule       . diazepam (VALIUM) 10 MG tablet Take 10 mg by mouth every 6 (six) hours as needed for anxiety.      . fentaNYL (DURAGESIC - DOSED MCG/HR) 50 MCG/HR Place 1 patch (50 mcg total) onto the skin  every 3 (three) days.  5 patch  0  . fluticasone (FLONASE) 50 MCG/ACT nasal spray       . ipratropium-albuterol (DUONEB) 0.5-2.5 (3) MG/3ML SOLN       . lidocaine-prilocaine (EMLA) cream Apply 1 application topically as needed.  30 g  6  . morphine (ROXANOL) 20 MG/ML concentrated solution Take 1 mL (20 mg total) by mouth every 2 (two) hours as needed for severe pain.  120 mL  0  . ondansetron (ZOFRAN) 8 MG tablet Take 1 tablet (8 mg total) by mouth every 8 (eight) hours as needed for nausea.  60 tablet  1  . PARoxetine (PAXIL) 20 MG tablet Take 20 mg by mouth daily.      . polyethylene glycol (MIRALAX / GLYCOLAX) packet Take 17 g by mouth daily.  14 each  0  . prochlorperazine (COMPAZINE) 10 MG tablet Take 1 tablet (10 mg total) by mouth every 6 (six) hours as needed (Nausea or vomiting).  60 tablet  1  . sodium fluoride (FLUORISHIELD) 1.1 % GEL dental gel Instill one drop of fluoride per tooth space of fluoride tray. Place over teeth for 5 minutes. Remove. Spit out excess. Repeat nightly.  120 mL  prn  No current facility-administered medications for this visit.    Review of Systems Review of Systems  Constitutional: Negative.   HENT: Positive for trouble swallowing.   Eyes: Negative.   Respiratory: Negative.   Cardiovascular: Negative.   Gastrointestinal: Negative.   Endocrine: Negative.   Genitourinary: Negative.   Musculoskeletal: Negative.   Skin: Negative.   Allergic/Immunologic: Negative.   Neurological: Negative.   Hematological: Negative.   Psychiatric/Behavioral: Negative.     Blood pressure 122/70, pulse 90, temperature 97.9 F (36.6 C), height 5\' 5"  (1.651 m), weight 122 lb (55.339 kg).  Physical Exam Physical Exam  Constitutional: She is oriented to person, place, and time. She appears well-developed and well-nourished.  HENT:  Head: Normocephalic and atraumatic.  Eyes: Conjunctivae and EOM are normal. Pupils are equal, round, and reactive to light.  Neck:  Normal range of motion. Neck supple.  Cardiovascular: Normal rate, regular rhythm and normal heart sounds.   Pulmonary/Chest: Effort normal and breath sounds normal.  Abdominal: Soft. Bowel sounds are normal.  Musculoskeletal: Normal range of motion.  Lymphadenopathy:    She has no cervical adenopathy.  Neurological: She is alert and oriented to person, place, and time.  Skin: Skin is warm and dry.  Psychiatric: She has a normal mood and affect. Her behavior is normal.    Data Reviewed As above  Assessment    The patient has oropharyngeal cancer and requires a Port-A-Cath for delivery of chemotherapy and a G-tube for nutrition. I've discussed with her in detail the risks and benefits of the operation to place both of these as well as some of the technical aspects and she understands and wishes to proceed     Plan    Plan for placement of Port-A-Cath as well as G-tube        Stacey Carroll,Stacey Carroll 05/05/2014, 4:40 PM

## 2014-05-18 NOTE — H&P (Signed)
Stacey Carroll is an 53 y.o. female.   Chief Complaint: obstructed airway HPI: She has epiglottis and could not be intubated for procedures. She needs tracheotomy.  Past Medical History  Diagnosis Date  . Visual disturbance 03/11/2013  . Dyslipidemia   . Headache(784.0)   . Depression   . Endometriosis   . Hypercholesterolemia   . Anxiety   . Scoliosis   . Arthritis   . Throat pain 04/19/2014  . Dysphagia 04/27/2014  . Cancer   . PONV (postoperative nausea and vomiting)   . Hypertension     not taking meds  . Asthma   . Cough     Past Surgical History  Procedure Laterality Date  . Cervical laminectomy  2007 (?), 2011  . Shoulder surgery Left   . Abdominal hysterectomy  1987  . Laryngoscopy and esophagoscopy N/A 04/14/2014    Procedure: DIRECT LARYNGOSCOPY WITH BIOPSY  AND ESOPHAGOSCOPY;  Surgeon: Ruby Cola, MD;  Location: [facility] OR;  Service: ENT;  Laterality: N/A;    Family History  Problem Relation Age of Onset  . Emphysema Mother   . Cancer Mother     throat ca  . Cancer Father     prostate ca  . Heart disease Father    Social History:  reports that she has been smoking Cigarettes.  She has a 19.5 pack-year smoking history. She has never used smokeless tobacco. She reports that she drinks alcohol. She reports that she does not use illicit drugs.  Allergies:  Allergies  Allergen Reactions  . Penicillins Other (See Comments)    From childhood    Medications Prior to Admission  Medication Sig Dispense Refill  . diazepam (VALIUM) 10 MG tablet Take 5 mg by mouth every 6 (six) hours as needed for anxiety.       . lidocaine-prilocaine (EMLA) cream Apply 1 application topically as needed.  30 g  6  . morphine (ROXANOL) 20 MG/ML concentrated solution Take 1 mL (20 mg total) by mouth every 2 (two) hours as needed for severe pain.  120 mL  0  . ondansetron (ZOFRAN) 8 MG tablet Take 1 tablet (8 mg total) by mouth every 8 (eight) hours as needed for nausea.  60 tablet  1  .  paroxetine (PAXIL) 10 MG/5ML suspension Take 20 mg by mouth every morning.      . polyethylene glycol (MIRALAX / GLYCOLAX) packet Take 17 g by mouth daily.  14 each  0  . prochlorperazine (COMPAZINE) 10 MG tablet Take 1 tablet (10 mg total) by mouth every 6 (six) hours as needed (Nausea or vomiting).  60 tablet  1  . sodium fluoride (FLUORISHIELD) 1.1 % GEL dental gel Instill one drop of fluoride per tooth space of fluoride tray. Place over teeth for 5 minutes. Remove. Spit out excess. Repeat nightly.  120 mL  prn  . fluticasone (FLONASE) 50 MCG/ACT nasal spray Place 2 sprays into both nostrils daily as needed for allergies.       Marland Kitchen ipratropium-albuterol (DUONEB) 0.5-2.5 (3) MG/3ML SOLN Take 3 mLs by nebulization every 4 (four) hours as needed (for shortness of breath).         Results for orders placed during the hospital encounter of 05/18/14 (from the past 48 hour(s))  POCT I-STAT 4, (NA,K, GLUC, HGB,HCT)     Status: Abnormal   Collection Time    05/18/14  8:31 AM      Result Value Ref Range   Sodium 139  137 -  147 mEq/L   Potassium 3.6 (*) 3.7 - 5.3 mEq/L   Glucose, Bld 71  70 - 99 mg/dL   HCT 42.0  36.0 - 46.0 %   Hemoglobin 14.3  12.0 - 15.0 g/dL   No results found.  ROS  Blood pressure 123/89, pulse 63, temperature 97.9 F (36.6 C), temperature source Oral, height 5\' 5"  (1.651 m), weight 53.524 kg (118 lb), SpO2 98.00%. Physical Exam  Constitutional: She appears well-nourished.  HENT:  Nose: Nose normal.  Eyes: Pupils are equal, round, and reactive to light.  Neck: Normal range of motion.  Cardiovascular: Normal rate.   Respiratory: Effort normal.  Lymphadenopathy:    She has cervical adenopathy.     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 Donne Hazel and cancer treatment  Melissa Montane 05/18/2014, 8:52 AM

## 2014-05-18 NOTE — Op Note (Signed)
Preoperative diagnosis: Oropharyngeal cancer Postoperative diagnosis: Same as above Procedure: #1 right subclavian power port insertion #2 open gastrostomy tube placement Surgeon: Dr. Serita Grammes Anesthesia: Gen. Complications: None Drains: None Specimens: None Sponge and needle count was correct Disposition to recovery stable  Indications: This is a 53 year old female who has an oropharyngeal cancer. She is due to begin therapy. She was unable to be intubated. She was then rescheduled for an awake tracheostomy, open gastrostomy and a port placement. I met her and we discussed the risks of all these.  Procedure: After informed consent was obtained the patient was taken the operating room. She was given ciprofloxacin due to her allergies. Sequential compression devices were on her legs. She first underwent an awake tracheostomy by Dr. Janace Hoard of ENT. Upon the completion of this I began my portion of the procedure. She had been prepped and draped in the sterile surgical fashion. A surgical timeout was performed.  I did the  Port-A-Cath first. I anesthetized the right chest wall. I then accessed the subclavian vein on the first pass. I did confirm this to be venous. I then passed the wire. This was confirmed to be in the correct position. I then made a pocket below this overlying the pectoralis fascia. I then inserted the dilator and the peel-away sheath under direct vision with fluoroscopy. I then inserted the line and removed the peel-away sheath. I pulled this back via the distal vena cava. I then secured this to the port. This flushed easily and aspirated blood. I then packed this with heparin. I sutured this in 2 positions with a 2-0 Prolene suture. Final fluoroscopy showed I was in good position without any kinks.I then closed with 3-0 Vicryl, 4-0 Monocryl, and Dermabond.  I then did a small midline laparotomy. I entered the peritoneal cavity. I identified the stomach. I then placed 2 2-0 silk  pursestring sutures to the stomach. I then found a place in the left rectus muscle I passed a MIC gastrostomy tube. I then made a gastrotomy and inserted this in place. I then tied my stitches down. I then sewed this to the abdominal wall with 2-0 silk sutures in a Stamm fashion. I then blew up the balloon with a 8.5 cc of fluid. This was snug against the abdominal wall and appeared to be in good position. I then closed with #1 PDS. The wound was then irrigated and stapled closed. A gastrostomy was then secured with nylon suture. Dressings were placed. She tolerated this well and was transferred to recovery stable.

## 2014-05-18 NOTE — Op Note (Signed)
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 Estimated blood loss: Less than 5 cc Indications: 53 year old with a squamous cell carcinoma of the epiglottis that needed chemotherapy/radiation and was set up for a G-tube and central line. The patient could not be intubated for that procedure. It was elected that she needed airway control with a tracheotomy. Because of the difficulty with intubation it was elected to perform a awake trach. She was informed a risk and benefits of the procedure and options were discussed all questions are answered and consent was obtained. Procedure: Patient was taken to the operating room placed in the supine position and a 30 elevation. Prepped and draped in usual sterile manner the anatomic structures were outlined with a marking pencil and him in the incision site vertical oriented was injected with 1% lidocaine with 1 100,000 epinephrine dissection was carried down through the skin to the strap muscles were the is diastases of the muscles was divided. The thyroid gland was encountered and the isthmus was divided with electrocautery. The trachea was identified easily. There was good hemostasis. The trachea was injected with 1% plain lidocaine The second and third tracheal ring was divided and inferior base flap was fashioned with a 2-0 chromic placed through the flap secured the subcutaneous tissue inferiorly. The patient was then placed off to sleep and the #6 Shiley was placed without difficulty. There was good end tidal CO2 return and good ventilation. The balloon was inflated and this was done prior to ventilation. The trach was secured with a 3-0 nylon and trach ties. The patient was then taken over by Dr. Donne Hazel for the other procedures to be dictated in a separate operative report.

## 2014-05-18 NOTE — Progress Notes (Signed)
Patient reports that she has not eating or drink anything since ~ 8/8- 8/9. Patient reports that they brought her in yesterday for hydration. Patient also c/o nausea. Dr. Oletta Lamas notified orders received for Zofran and IStat

## 2014-05-18 NOTE — Anesthesia Preprocedure Evaluation (Signed)
Anesthesia Evaluation  Patient identified by MRN, date of birth, ID band Patient awake    Reviewed: Allergy & Precautions, H&P , NPO status , Patient's Chart, lab work & pertinent test results  Airway Mallampati: III TM Distance: <3 FB Neck ROM: Limited  Mouth opening: Limited Mouth Opening  Dental   Pulmonary asthma , Current Smoker,  breath sounds clear to auscultation        Cardiovascular hypertension, Rhythm:Regular Rate:Normal     Neuro/Psych  Headaches,    GI/Hepatic Neg liver ROS, History noted. CE   Endo/Other    Renal/GU negative Renal ROS     Musculoskeletal   Abdominal   Peds  Hematology   Anesthesia Other Findings   Reproductive/Obstetrics                           Anesthesia Physical Anesthesia Plan  ASA: III  Anesthesia Plan: MAC and General   Post-op Pain Management:    Induction: Intravenous  Airway Management Planned: Tracheostomy  Additional Equipment:   Intra-op Plan:   Post-operative Plan: Possible Post-op intubation/ventilation  Informed Consent: I have reviewed the patients History and Physical, chart, labs and discussed the procedure including the risks, benefits and alternatives for the proposed anesthesia with the patient or authorized representative who has indicated his/her understanding and acceptance.   Dental advisory given  Plan Discussed with: CRNA, Anesthesiologist and Surgeon  Anesthesia Plan Comments:         Anesthesia Quick Evaluation

## 2014-05-18 NOTE — Progress Notes (Signed)
Patient reports that nausea in better

## 2014-05-19 ENCOUNTER — Observation Stay (HOSPITAL_COMMUNITY): Payer: BC Managed Care – PPO

## 2014-05-19 ENCOUNTER — Ambulatory Visit: Payer: BC Managed Care – PPO

## 2014-05-19 ENCOUNTER — Encounter (HOSPITAL_COMMUNITY): Payer: Self-pay | Admitting: General Surgery

## 2014-05-19 DIAGNOSIS — F4321 Adjustment disorder with depressed mood: Secondary | ICD-10-CM | POA: Diagnosis present

## 2014-05-19 DIAGNOSIS — Z510 Encounter for antineoplastic radiation therapy: Secondary | ICD-10-CM | POA: Diagnosis present

## 2014-05-19 DIAGNOSIS — E785 Hyperlipidemia, unspecified: Secondary | ICD-10-CM | POA: Diagnosis present

## 2014-05-19 DIAGNOSIS — R1310 Dysphagia, unspecified: Secondary | ICD-10-CM | POA: Diagnosis not present

## 2014-05-19 DIAGNOSIS — Z8042 Family history of malignant neoplasm of prostate: Secondary | ICD-10-CM | POA: Diagnosis not present

## 2014-05-19 DIAGNOSIS — K5909 Other constipation: Secondary | ICD-10-CM | POA: Diagnosis present

## 2014-05-19 DIAGNOSIS — E7800 Pure hypercholesterolemia, unspecified: Secondary | ICD-10-CM | POA: Diagnosis present

## 2014-05-19 DIAGNOSIS — Z8249 Family history of ischemic heart disease and other diseases of the circulatory system: Secondary | ICD-10-CM | POA: Diagnosis not present

## 2014-05-19 DIAGNOSIS — I10 Essential (primary) hypertension: Secondary | ICD-10-CM | POA: Diagnosis present

## 2014-05-19 DIAGNOSIS — J45909 Unspecified asthma, uncomplicated: Secondary | ICD-10-CM | POA: Diagnosis present

## 2014-05-19 DIAGNOSIS — D649 Anemia, unspecified: Secondary | ICD-10-CM | POA: Diagnosis present

## 2014-05-19 DIAGNOSIS — Z836 Family history of other diseases of the respiratory system: Secondary | ICD-10-CM | POA: Diagnosis not present

## 2014-05-19 DIAGNOSIS — Z800 Family history of malignant neoplasm of digestive organs: Secondary | ICD-10-CM | POA: Diagnosis not present

## 2014-05-19 DIAGNOSIS — Z431 Encounter for attention to gastrostomy: Secondary | ICD-10-CM | POA: Diagnosis not present

## 2014-05-19 DIAGNOSIS — E46 Unspecified protein-calorie malnutrition: Secondary | ICD-10-CM

## 2014-05-19 DIAGNOSIS — Z880 Allergy status to penicillin: Secondary | ICD-10-CM | POA: Diagnosis not present

## 2014-05-19 DIAGNOSIS — C100 Malignant neoplasm of vallecula: Secondary | ICD-10-CM | POA: Diagnosis not present

## 2014-05-19 DIAGNOSIS — Z930 Tracheostomy status: Secondary | ICD-10-CM | POA: Diagnosis not present

## 2014-05-19 DIAGNOSIS — D473 Essential (hemorrhagic) thrombocythemia: Secondary | ICD-10-CM | POA: Diagnosis present

## 2014-05-19 DIAGNOSIS — E43 Unspecified severe protein-calorie malnutrition: Secondary | ICD-10-CM | POA: Diagnosis present

## 2014-05-19 DIAGNOSIS — R1313 Dysphagia, pharyngeal phase: Secondary | ICD-10-CM | POA: Diagnosis present

## 2014-05-19 DIAGNOSIS — J988 Other specified respiratory disorders: Secondary | ICD-10-CM | POA: Diagnosis present

## 2014-05-19 DIAGNOSIS — IMO0002 Reserved for concepts with insufficient information to code with codable children: Secondary | ICD-10-CM | POA: Diagnosis not present

## 2014-05-19 DIAGNOSIS — Z79899 Other long term (current) drug therapy: Secondary | ICD-10-CM | POA: Diagnosis not present

## 2014-05-19 DIAGNOSIS — F17200 Nicotine dependence, unspecified, uncomplicated: Secondary | ICD-10-CM | POA: Diagnosis not present

## 2014-05-19 DIAGNOSIS — F411 Generalized anxiety disorder: Secondary | ICD-10-CM | POA: Diagnosis not present

## 2014-05-19 DIAGNOSIS — Z90710 Acquired absence of both cervix and uterus: Secondary | ICD-10-CM | POA: Diagnosis not present

## 2014-05-19 LAB — APTT: aPTT: 37 seconds (ref 24–37)

## 2014-05-19 LAB — GLUCOSE, CAPILLARY
GLUCOSE-CAPILLARY: 119 mg/dL — AB (ref 70–99)
GLUCOSE-CAPILLARY: 146 mg/dL — AB (ref 70–99)
Glucose-Capillary: 109 mg/dL — ABNORMAL HIGH (ref 70–99)

## 2014-05-19 LAB — PROTIME-INR
INR: 1.19 (ref 0.00–1.49)
PROTHROMBIN TIME: 15.1 s (ref 11.6–15.2)

## 2014-05-19 MED ORDER — JEVITY 1.2 CAL PO LIQD
1000.0000 mL | ORAL | Status: DC
  Administered 2014-05-19: 14:00:00
  Administered 2014-05-22 – 2014-05-23 (×2): 1000 mL
  Filled 2014-05-19 (×7): qty 1000
  Filled 2014-05-19: qty 237
  Filled 2014-05-19 (×2): qty 1000

## 2014-05-19 MED ORDER — DIPHENHYDRAMINE HCL 50 MG/ML IJ SOLN: 12.5000 mg | Freq: Four times a day (QID) | INTRAMUSCULAR | Status: DC | PRN

## 2014-05-19 MED ORDER — DIPHENHYDRAMINE HCL 12.5 MG/5ML PO ELIX
25.0000 mg | ORAL_SOLUTION | Freq: Four times a day (QID) | ORAL | Status: DC | PRN
  Administered 2014-05-19: 25 mg
  Filled 2014-05-19: qty 20

## 2014-05-19 NOTE — Progress Notes (Signed)
UR Completed Carrigan Delafuente Graves-Bigelow, RN,BSN [phone]  

## 2014-05-19 NOTE — Progress Notes (Signed)
INITIAL NUTRITION ASSESSMENT  DOCUMENTATION CODES Per approved criteria  -Severe malnutrition in the context of chronic illness   INTERVENTION:  Initiate TF via G-tube with Jevity 1.2 at 20 ml/h, increase by 10 ml every 12 hours to goal rate of 65 ml/h to provide 1872 kcals, 87 gm protein, 1264 ml free water daily.  Diet per SLP.  Monitor magnesium, potassium, and phosphorus daily for at least 3 days, MD to replete as needed, as pt is at risk for refeeding syndrome given severe PCM with recent minimal oral intake.  NUTRITION DIAGNOSIS: Malnutrition related to difficulty swallowing with inadequate oral intake as evidenced by severe depletion of muscle and subcutaneous fat mass with 7% weight loss in the past month.   Goal: Intake to meet >90% of estimated nutrition needs.  Monitor:  TF tolerance/adequacy, weight trend, labs, PO intake.  Reason for Assessment: MD Consult for TF initiation and management.  53 y.o. female  Admitting Dx: obstructed airway  ASSESSMENT: 53 yo female with recently diagnosed oral pharyngeal cancer. Admitted on 8/13 for insertion of port-a-cath, G-tube placement, and tracheostomy.  Received MD Consult for TF initiation and management via newly placed G-tube. Requested to advance TF slowly by surgery team. Patient with increased protein and calorie needs with ongoing catabolic illness.  Patient with recent difficulty swallowing for the past 6 months and has only been able to eat pudding. Unable to drink liquids well PTA. Plans for swallowing evaluation today to determine appropriate diet for patient.  Patient is at risk for refeeding syndrome, given severe PCM with recent minimal oral intake.  Nutrition Focused Physical Exam:  Subcutaneous Fat:  Orbital Region: severe depletion Upper Arm Region: mild depletion Thoracic and Lumbar Region: mild depletion  Muscle:  Temple Region: severe depletion Clavicle Bone Region: severe depletion Clavicle and  Acromion Bone Region: severe depletion Scapular Bone Region: mild depletion Dorsal Hand: moderate depletion Patellar Region: moderate depletion Anterior Thigh Region: mild depletion Posterior Calf Region: moderate depletion  Edema: none  Pt meets criteria for severe MALNUTRITION in the context of chronic illness as evidenced by severe depletion of muscle and subcutaneous fat mass with 7% weight loss in the past month.  Height: Ht Readings from Last 1 Encounters:  05/18/14 5\' 5"  (1.651 m)    Weight: Wt Readings from Last 1 Encounters:  05/18/14 122 lb 12.7 oz (55.7 kg)    Ideal Body Weight: 56.8 kg  % Ideal Body Weight: 98%  Wt Readings from Last 10 Encounters:  05/18/14 122 lb 12.7 oz (55.7 kg)  05/18/14 122 lb 12.7 oz (55.7 kg)  05/15/14 119 lb 4.3 oz (54.1 kg)  05/15/14 119 lb 4.3 oz (54.1 kg)  05/10/14 119 lb 4.3 oz (54.1 kg)  05/05/14 122 lb (55.339 kg)  05/04/14 124 lb 3.2 oz (56.337 kg)  04/19/14 131 lb 1 oz (59.45 kg)  03/11/13 151 lb (68.493 kg)    Usual Body Weight: 131 lb (1 month ago)  % Usual Body Weight: 93%  BMI:  Body mass index is 20.43 kg/(m^2).  Estimated Nutritional Needs: Kcal: 1700-1900 Protein: 85-100 gm Fluid: 1.8 L  Skin: neck, chest, and abdominal incisions  Diet Order: Clear Liquid  EDUCATION NEEDS: -Education not appropriate at this time   Intake/Output Summary (Last 24 hours) at 05/19/14 0944 Last data filed at 05/19/14 0800  Gross per 24 hour  Intake 2561.75 ml  Output   1540 ml  Net 1021.75 ml    Last BM: None documented since admission  Labs:   Recent Labs Lab 05/18/14 0831  NA 139  K 3.6*  GLUCOSE 71    CBG (last 3)  No results found for this basename: GLUCAP,  in the last 72 hours  Scheduled Meds: . antiseptic oral rinse  7 mL Mouth Rinse q12n4p  . chlorhexidine  15 mL Mouth Rinse BID  . enoxaparin (LOVENOX) injection  40 mg Subcutaneous Q24H  . morphine   Intravenous 6 times per day  .  pantoprazole (PROTONIX) IV  40 mg Intravenous QHS  . PARoxetine  10 mg Per Tube Daily    Continuous Infusions: . sodium chloride 75 mL/hr at 05/19/14 5035    Past Medical History  Diagnosis Date  . Visual disturbance 03/11/2013  . Dyslipidemia   . Headache(784.0)   . Depression   . Endometriosis   . Hypercholesterolemia   . Anxiety   . Scoliosis   . Arthritis   . Throat pain 04/19/2014  . Dysphagia 04/27/2014  . Cancer   . PONV (postoperative nausea and vomiting)   . Hypertension     not taking meds  . Asthma   . Cough     Past Surgical History  Procedure Laterality Date  . Cervical laminectomy  2007 (?), 2011  . Shoulder surgery Left   . Abdominal hysterectomy  1987  . Laryngoscopy and esophagoscopy N/A 04/14/2014    Procedure: DIRECT LARYNGOSCOPY WITH BIOPSY  AND ESOPHAGOSCOPY;  Surgeon: Ruby Cola, MD;  Location: [place];  Service: ENT;  Laterality: N/A;  . Portacath placement N/A 05/18/2014    Procedure: INSERTION PORT-A-CATH;  Surgeon: Rolm Bookbinder, MD;  Location: [place];  Service: General;  Laterality: N/A;  . Gastrostomy N/A 05/18/2014    Procedure: GASTROSTOMY/PLACEMENT OF G-TUBE;  Surgeon: Rolm Bookbinder, MD;  Location: [place];  Service: General;  Laterality: N/A;  . Tracheostomy tube placement N/A 05/18/2014    Procedure: TRACHEOSTOMY;  Surgeon: Melissa Montane, MD;  Location: [place];  Service: ENT;  Laterality: N/A;    Molli Barrows, [place], [place], [place] Pager [phone] After Hours Pager [phone]

## 2014-05-19 NOTE — Care Management Note (Signed)
  Page 2 of 2   05/23/2014     10:24:57 AM CARE MANAGEMENT NOTE 05/23/2014  Patient:  Stacey Carroll, Stacey Carroll   Account Number:  [id number]  Date Initiated:  05/19/2014  Documentation initiated by:  Marvetta Gibbons  Subjective/Objective Assessment:   Pt admitted s/p new trach, peg, and portacath placement     Action/Plan:   PTA pt lived at home- NCM to follow for d/c needs planning- anticipate DME needs   Anticipated DC Date:  05/23/2014   Anticipated DC Plan:  [place]  CM consult      [place]   Choice offered to / List presented to:  C-1 Patient   DME arranged  [place]      DME agency  [place] arranged  HH-1 RN  [place].   Status of service:  In process, will continue to follow Medicare Important Message given?   (If response is "NO", the following Medicare IM given date fields will be blank) Date Medicare IM given:   Medicare IM given by:   Date Additional Medicare IM given:   Additional Medicare IM given by:    Discharge Disposition:    Per UR Regulation:  Reviewed for med. necessity/level of care/duration of stay  If discussed at [place] of Stay Meetings, dates discussed:   05/23/2014    Comments:  05-23-14 Advanced aware discharge is today . DME has all been ordered . Bedside nurse will send extra trach and amb bag with patient. Magdalen Spatz RN BSN [phone]   05/22/14- [place] RN, BSN [phone] Referral for [facility] and DME needs- pt will go home on TF and will need trach supplies- orders placed for trach supplies, TF supplies and pump- along with HH-RN and RT. In to speak with pt at bedside regarding d/c needs- list of [facility] agencies for [facility] reviewed with pt- per pt choice she will use [facility] for DME and [place] services- referral called  to [place] with [facility] for [facility] -RN/RT, also called Pam with [facility] for TF needs- spoke with Jermaine with [facility] for DME needs- bedside RN to call RT here to begin trach teaching at bedside-  NCM to continue to follow - d/c planning for home in am.  05/19/14- 1640- Marvetta Gibbons RN, BSN [phone] Referral received for d/c needs/planning- introduced CM role to pt- NCM to follow pt progress and will assist with DME and home needs when pt ready for discharge.

## 2014-05-19 NOTE — Progress Notes (Signed)
1 Day Post-Op  Subjective: She is breathing well and just recently had some bleeding around the trach. It stopped spontaneously. This occurred after a coughing episode. She is due to start radiation treatments on Monday at [place] [facility].  Objective: Vital signs in last 24 hours: Temp:  [97.7 F (36.5 C)-98.8 F (37.1 C)] 98.6 F (37 C) (08/14 0919) Pulse Rate:  [78-107] 102 (08/14 0937) Resp:  [13-30] 26 (08/14 1133) BP: (148-165)/(90-101) 153/95 mmHg (08/14 0748) SpO2:  [97 %-100 %] 100 % (08/14 1133) FiO2 (%):  [28 %] 28 % (08/14 1133) Weight:  [55.7 kg (122 lb 12.7 oz)] 55.7 kg (122 lb 12.7 oz) (08/13 1521)    Intake/Output from previous day: 08/13 0701 - 08/14 0700 In: 2411.8 [I.V.:2411.8] Out: 1540 [Urine:1500; Blood:40] Intake/Output this shift: Total I/O In: 150 [I.V.:150] Out: -   Awake and alert. The tracheotomy site is without bleeding currently. There is good airway. The balloon is inflated.  Lab Results:   Recent Labs  05/18/14 0831 05/18/14 1427  WBC  --  22.0*  HGB 14.3 14.4  HCT 42.0 42.6  PLT  --  353   BMET  Recent Labs  05/18/14 0831  NA 139  K 3.6*  GLUCOSE 71   PT/INR No results found for this basename: LABPROT, INR,  in the last 72 hours ABG No results found for this basename: PHART, PCO2, PO2, HCO3,  in the last 72 hours  Studies/Results: Dg Chest 2 View  05/19/2014   CLINICAL DATA:  Postoperative wheezing  EXAM: CHEST  2 VIEW  COMPARISON:  05/18/2014  FINDINGS: Tracheostomy tube and right-sided chest wall port are again seen and stable. Postsurgical changes in the cervical spine are seen. The cardiac shadow is within normal limits. No pneumothorax or sizable infiltrate is seen. Free air air is noted beneath the hemidiaphragms consistent with the recent gastrostomy placement it is relatively stable from the prior exam.  IMPRESSION: Postoperative change as described. Persistent free air is noted consistent with the recent gastrostomy  placement. This finding was described on the previous exam.   Electronically Signed   By: Inez Catalina M.D.   On: 05/19/2014 09:58   Dg Chest Port 1 View  05/18/2014   CLINICAL DATA:  Port-A-Cath placement  EXAM: PORTABLE CHEST - 1 VIEW  COMPARISON:  04/14/2014  FINDINGS: Cardiomediastinal silhouette is stable. Tracheostomy tube in place. No acute infiltrate or pleural effusion. No pulmonary edema. There is a right subclavian Port-A-Cath with tip in right atrium. No pneumothorax. Small amount of free air under right hemidiaphragm probable postsurgical in nature. Clinical correlation is necessary.  IMPRESSION: Tracheostomy tube in place. No acute infiltrate or pleural effusion. No pulmonary edema. There is a right subclavian Port-A-Cath with tip in right atrium. No pneumothorax. Small amount of free air under right hemidiaphragm probable postsurgical in nature. Clinical correlation is necessary.   Electronically Signed   By: Lahoma Crocker M.D.   On: 05/18/2014 12:40   Dg Fluoro Guide Cv Line-no Report  05/18/2014   CLINICAL DATA: power port placement   FLOURO GUIDE CV LINE  Fluoroscopy was utilized by the requesting physician.  No radiographic  interpretation.     Anti-infectives: Anti-infectives   Start     Dose/Rate Route Frequency Ordered Stop   05/18/14 1030  ciprofloxacin (CIPRO) IVPB 400 mg     400 mg 200 mL/hr over 60 Minutes Intravenous To Surgery 05/18/14 1020 05/18/14 1024      Assessment/Plan: s/p Procedure(s): INSERTION PORT-A-CATH (  N/A) GASTROSTOMY/PLACEMENT OF G-TUBE (N/A) TRACHEOSTOMY (N/A) She has a fresh tracheotomy that will need to form a good track for about 4 days. A cuffless trach can then be placed. Apparently Monday she is supposed to start radiation. She will need a day or 2 longer from a standpoint of discharge than Monday to start her radiation treatments. The bleeding seems to have stopped. Surgicel was brought to the bedside for any future issue. Call if there's  further problems with this.  LOS: 1 day    Stacey Carroll 05/19/2014

## 2014-05-19 NOTE — Consult Note (Signed)
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 female with a histroy of squamous cell carcinoma, recently diagnosed, admitted on 8/13 due to increasing wheezing and shortness of breath requiring local tracheostomy on 8/13, generalized malaise and worsening dysphagia, without relief with the current analgesic regimens. She has lost 12 pound weight loss in less than 3 months due t dysphagia, unable to swallow liquids and solids. She denied nausea or vomiting, abdominal pain, stool or urinary dysfunction. No bleeding issues. No confusion was noted. She had subjective fevers, but denied chills or night sweats. On admission, she had a feeding tube placement, and nutrition is helping in the management of calorie intake.  Labs were remarkable for leukocytosis and thrombocytosis, which are normalizing. She was to start chemotherapy on 8/12, but due to these events, this plan had to be postponed.    Oncology History    Oropharyngeal cancer  Primary site: Pharynx - Oropharynx  Staging method: AJCC 7th Edition  Clinical: (T3, NX)  Summary: (T3, NX)     Oropharyngeal cancer    04/14/2014  Imaging  CT scan of the neck showed oropharyngeal mass.    04/14/2014  Pathology  Biopsy [id number] of the vallecula came back positive for squamous cell carcinoma, HPV status pending.    04/14/2014  Surgery  Laryngoscopy of the lower tongue base, vallecula, and lingual surface of the epiglottis showed a fungating irregular mass      04/26/14                      Imaging                  PET scan showing Hypermetabolic hypopharyngeal mass with hypermetabolic lymph nodes                                                                                                         in the neck bilaterally. No evidence of distant metastatic disease.   05/15/14                     Surgery                    Port A Cath placement   8 /13/15                    Surgery                  Gastric Tube Placement on 8/13   05/18/14                     Surgery                  Local  Tracheostomy                                       Past Medical History  Diagnosis Date  . Visual disturbance 03/11/2013  . Dyslipidemia   . Headache(784.0)   . Depression   . Endometriosis   . Hypercholesterolemia   . Anxiety   . Scoliosis   . Arthritis   . Throat pain 04/19/2014  . Dysphagia 04/27/2014  . Cancer   . PONV (postoperative nausea and vomiting)   . Hypertension     not taking meds  . Asthma   . Cough     Past Surgical History  Procedure Laterality Date  . Cervical laminectomy  2007 (?), 2011  . Shoulder surgery Left   . Abdominal hysterectomy  1987  . Laryngoscopy and esophagoscopy N/A 04/14/2014    Procedure: DIRECT LARYNGOSCOPY WITH BIOPSY  AND ESOPHAGOSCOPY;  Surgeon: Ruby Cola, MD;  Location: [facility];  Service: ENT;  Laterality: N/A;  . Portacath placement N/A 05/18/2014    Procedure: INSERTION PORT-A-CATH;  Surgeon: Rolm Bookbinder, MD;  Location: [facility];  Service: General;  Laterality: N/A;  . Gastrostomy N/A 05/18/2014    Procedure: GASTROSTOMY/PLACEMENT OF G-TUBE;  Surgeon: Rolm Bookbinder, MD;  Location: [facility];  Service: General;  Laterality: N/A;  . Tracheostomy tube placement N/A 05/18/2014    Procedure: TRACHEOSTOMY;  Surgeon: Melissa Montane, MD;  Location: [facility];  Service: ENT;  Laterality: N/A;    REVIEW OF SYSTEMS:  Constitutional: Positive for subjective fevers, chills or abnormal night sweats  Eyes: Denies blurriness of vision, double vision or watery eyes  Respiratory: Reports wheezing on admission requiring tracheostomy  Cardiovascular: Denies palpitation, chest discomfort or lower extremity swelling   Gastrointestinal: Denies nausea, heartburn or change in bowel habits. 12 pound weight loss in less than 3 months. The first initial presentation was due to sensation of sore throat for the last 4-6 months. She described her pain as severe and rated her pain is 7/10 pain. Dysphagia to liquids and solids due to malignancy, now s/p gastric tube placement for nutrition issues.  Skin: Denies abnormal skin rashes  Lymphatics: Denies new lymphadenopathy other than known neck lymph nodes, or easy bruising  Neurological: Denies numbness, tingling or new weaknesses.She has chronic neck pain and back pain and requiring multiple surgeries due to a herniated discs. She was unable to return to work due to her disability.  Behavioral/Psych: Mood is depressed due to current situation.  no new changes  All other systems were reviewed with the patient and are negative.     Scheduled Meds: . antiseptic oral rinse  7 mL Mouth Rinse q12n4p  . chlorhexidine  15 mL Mouth Rinse BID  . enoxaparin (LOVENOX) injection  40 mg Subcutaneous Q24H  . morphine   Intravenous 6 times per day  . pantoprazole (PROTONIX) IV  40 mg Intravenous QHS  . PARoxetine  10 mg Per Tube Daily   Continuous Infusions: . sodium chloride 75 mL/hr at 05/19/14 0324  . feeding supplement (JEVITY 1.2 CAL)     PRN Meds:.acetaminophen, acetaminophen, diazepam, diphenhydrAMINE, diphenhydrAMINE, fluticasone, ipratropium-albuterol, morphine injection, morphine CONCENTRATE, naloxone, ondansetron, sodium chloride  Family History  Problem Relation Age of Onset  . Emphysema Mother   . Cancer Mother     throat ca  . Cancer Father     prostate ca  . Heart disease Father  History   Social History  . Marital Status: Single    Spouse Name: N/A    Number of Children: 1  . Years of Education: N/A   Occupational History  .  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  Smoking status: Current Every Day Smoker -- 0.50 packs/day for 39 years. Trying to quit    Types: Cigarettes  . Smokeless tobacco: Never Used  . Alcohol Use: Yes     Comment: occasional  . Drug Use: No  . Sexual Activity: Not on file    [place] Objective:  Filed Vitals:   05/19/14 1133  BP:   Pulse:   Temp:   Resp: 26      Intake/Output Summary (Last 24 hours) at 05/19/14 1338 Last data filed at 05/19/14 0800  Gross per 24 hour  Intake 1361.75 ml  Output   1500 ml  Net -138.25 ml    ECOG PERFORMANCE STATUS: 3   GENERAL: alert, no distress and comfortable. Flat affect. Trach tube present without abnormal secretions. SKIN: skin color, texture, turgor are normal, no rashes or significant lesions EYES: normal, conjunctiva are pink and non-injected, sclera clear OROPHARYNX:no exudate, no erythema and lips, buccal mucosa, and tongue normal  NECK: supple, thyroid normal size, non-tender, without nodularity LYMPH:  no palpable lymphadenopathy in the cervical, axillary or inguinal LUNGS: notable for bilateral expiratory wheezing.  HEART: regular rate & rhythm and no murmurs and no lower extremity edema. Right port A Cath non tender ABDOMEN:abdomen soft, non-tender and normal bowel sounds. Gastric tube non tender.  Musculoskeletal:no cyanosis of digits and no clubbing  PSYCH: alert & oriented x 3 with fluent speech NEURO: no focal motor/sensory deficits    CBG (last 3)  No results found for this basename: GLUCAP,  in the last 72 hours   Labs:   Recent Labs Lab 05/18/14 0831 05/18/14 1427  WBC  --  22.0*  HGB 14.3 14.4  HCT 42.0 42.6  PLT  --  353  MCV  --  91.2  MCH  --  30.8  MCHC  --  33.8  RDW  --  13.1     Chemistries:    Recent Labs Lab 05/18/14 0831  NA 139  K 3.6*  GLUCOSE 71     Urine Studies     Component Value Date/Time   COLORURINE YELLOW 08/19/2009 1800   APPEARANCEUR CLEAR 08/19/2009 1800   LABSPEC 1.013 08/19/2009 1800    PHURINE 6.0 08/19/2009 1800   GLUCOSEU NEGATIVE 08/19/2009 1800   HGBUR TRACE* 08/19/2009 1800   BILIRUBINUR NEGATIVE 08/19/2009 1800   KETONESUR NEGATIVE 08/19/2009 1800   PROTEINUR NEGATIVE 08/19/2009 1800   UROBILINOGEN 0.2 08/19/2009 1800   NITRITE NEGATIVE 08/19/2009 1800   LEUKOCYTESUR NEGATIVE 08/19/2009 1800        Imaging Studies:  Dg Chest 2 View  05/19/2014   CLINICAL DATA:  Postoperative wheezing  EXAM: CHEST  2 VIEW  COMPARISON:  05/18/2014  FINDINGS: Tracheostomy tube and right-sided chest wall port are again seen and stable. Postsurgical changes in the cervical spine are seen. The cardiac shadow is within normal limits. No pneumothorax or sizable infiltrate is seen. Free air air is noted beneath the hemidiaphragms consistent with the recent gastrostomy placement it is relatively stable from the prior exam.  IMPRESSION: Postoperative change as described. Persistent free air is noted consistent with the recent gastrostomy placement.  This finding was described on the previous exam.   Electronically Signed   By: Inez Catalina M.D.   On: 05/19/2014 09:58   Dg Chest Port 1 View  05/18/2014   CLINICAL DATA:  Port-A-Cath placement  EXAM: PORTABLE CHEST - 1 VIEW  COMPARISON:  04/14/2014  FINDINGS: Cardiomediastinal silhouette is stable. Tracheostomy tube in place. No acute infiltrate or pleural effusion. No pulmonary edema. There is a right subclavian Port-A-Cath with tip in right atrium. No pneumothorax. Small amount of free air under right hemidiaphragm probable postsurgical in nature. Clinical correlation is necessary.  IMPRESSION: Tracheostomy tube in place. No acute infiltrate or pleural effusion. No pulmonary edema. There is a right subclavian Port-A-Cath with tip in right atrium. No pneumothorax. Small amount of free air under right hemidiaphragm probable postsurgical in nature. Clinical correlation is necessary.   Electronically Signed   By: Lahoma Crocker M.D.   On: 05/18/2014 12:40    Dg Fluoro Guide Cv Line-no Report  05/18/2014   CLINICAL DATA: power port placement   FLOURO GUIDE CV LINE  Fluoroscopy was utilized by the requesting physician.  No radiographic  interpretation.     Assessment/Plan: 53 y.o.  Carcinoma of Vallecula Epiglottica She had a  port-A cath placement on 8/10 and feeding tube on 9/67 without complications in anticipation for chemotherapy CT simulation took place on 8/3 by Dr. Isidore Moos with plans for radiation to tumor and bilateral neck nodes total planned of 70 G in 35 fractions.  She was to receive chemo on 8/12 with Cisplatin every 21 days along with radiation. This plans had to be placed on hold until discharge, once patient is clinically stable to proceed.   Malnutrition, severe  Due to acute illness Appreciate dietitian consult  Patient on PEG tube feedings. Tolerating well  Throat pain  Controlled with  morphine sulfate IV for pain. Patient had a tracheostomy on 8/13, monitor for areas of inflammation or exudate which could increase her pain in the region.  Leukocytosis Thrombocytosis Likely reactive, in the setting of inflammation, recent procedures, pain and wheezing Will recommend to monitor. Check PT/PTT and INR   No other additional workup is needed.  Situational Depression On Paroxetine  DVT prophylaxis Continue Lovenox  Full Code  Other medical issues as per admitting team    **Disclaimer: This note was dictated with voice recognition software. Similar sounding words can inadvertently be transcribed and this note may contain transcription errors which may not have been corrected upon publication of note.Sharene Butters E, PA-C 05/19/2014  1:38 PM  Attending Note  I personally saw the patient, reviewed the chart and examined the patient. The plan of care was discussed with the patient and the admitting team. I agree with the assessment and plan as documented above.  1. Head and Neck cancer: Patient was supposed to  start chemo-XRT Monday but it may be delayed due to the hospitalization. I informed Dr.Gorsuch regarding her admission. Once patient gets discharged, she will initiate concurrent chemo-XRT 2. Reactive leucocytosis and thrombocytosis: NTD Thank you very much for the consultation.

## 2014-05-19 NOTE — Progress Notes (Signed)
1 Day Post-Op  Subjective: She is wheezing and sounds terrible.  She feels bad.  She feels febrile, but her oral temp is 98.6.  She is still smoking some at home.  She apparently lives at home with a son.    Objective: Vital signs in last 24 hours: Temp:  [97.6 F (36.4 C)-98.8 F (37.1 C)] 98.5 F (36.9 C) (08/14 0748) Pulse Rate:  [78-107] 95 (08/14 0748) Resp:  [13-27] 26 (08/14 0748) BP: (148-168)/(90-101) 153/95 mmHg (08/14 0748) SpO2:  [97 %-100 %] 98 % (08/14 0748) FiO2 (%):  [28 %] 28 % (08/14 0510) Weight:  [55.7 kg (122 lb 12.7 oz)] 55.7 kg (122 lb 12.7 oz) (08/13 1521)  Diet:  Clears Afebrile, BP up WBC is up to 22k Intake/Output from previous day: 08/13 0701 - 08/14 0700 In: 2411.8 [I.V.:2411.8] Out: 1540 [Urine:1500; Blood:40] Intake/Output this shift: Total I/O In: 150 [I.V.:150] Out: -   General appearance: alert, cooperative and mild distress Resp: wheezes bilaterally GI: tender, incision and tube site look good.  Lab Results:   Recent Labs  05/18/14 0831 05/18/14 1427  WBC  --  22.0*  HGB 14.3 14.4  HCT 42.0 42.6  PLT  --  353    BMET  Recent Labs  05/18/14 0831  NA 139  K 3.6*  GLUCOSE 71   PT/INR No results found for this basename: LABPROT, INR,  in the last 72 hours  No results found for this basename: AST, ALT, ALKPHOS, BILITOT, PROT, ALBUMIN,  in the last 168 hours   Lipase  No results found for this basename: lipase     Studies/Results: Dg Chest Port 1 View  05/18/2014   CLINICAL DATA:  Port-A-Cath placement  EXAM: PORTABLE CHEST - 1 VIEW  COMPARISON:  04/14/2014  FINDINGS: Cardiomediastinal silhouette is stable. Tracheostomy tube in place. No acute infiltrate or pleural effusion. No pulmonary edema. There is a right subclavian Port-A-Cath with tip in right atrium. No pneumothorax. Small amount of free air under right hemidiaphragm probable postsurgical in nature. Clinical correlation is necessary.  IMPRESSION: Tracheostomy tube  in place. No acute infiltrate or pleural effusion. No pulmonary edema. There is a right subclavian Port-A-Cath with tip in right atrium. No pneumothorax. Small amount of free air under right hemidiaphragm probable postsurgical in nature. Clinical correlation is necessary.   Electronically Signed   By: Lahoma Crocker M.D.   On: 05/18/2014 12:40   Dg Fluoro Guide Cv Line-no Report  05/18/2014   CLINICAL DATA: power port placement   FLOURO GUIDE CV LINE  Fluoroscopy was utilized by the requesting physician.  No radiographic  interpretation.     Medications: . antiseptic oral rinse  7 mL Mouth Rinse q12n4p  . chlorhexidine  15 mL Mouth Rinse BID  . enoxaparin (LOVENOX) injection  40 mg Subcutaneous Q24H  . morphine   Intravenous 6 times per day  . pantoprazole (PROTONIX) IV  40 mg Intravenous QHS  . PARoxetine  10 mg Per Tube Daily   . sodium chloride 75 mL/hr at 05/19/14 0324    Prior to Admission medications   Medication Sig Start Date End Date Taking? Authorizing Provider  diazepam (VALIUM) 10 MG tablet Take 5 mg by mouth every 6 (six) hours as needed for anxiety.    Yes Historical Provider, MD  lidocaine-prilocaine (EMLA) cream Apply 1 application topically as needed. 05/04/14  Yes Heath Lark, MD  morphine (ROXANOL) 20 MG/ML concentrated solution Take 1 mL (20 mg total) by mouth every  2 (two) hours as needed for severe pain. 05/04/14  Yes Heath Lark, MD  ondansetron (ZOFRAN) 8 MG tablet Take 1 tablet (8 mg total) by mouth every 8 (eight) hours as needed for nausea. 05/04/14  Yes Heath Lark, MD  paroxetine (PAXIL) 10 MG/5ML suspension Take 20 mg by mouth every morning.   Yes Historical Provider, MD  polyethylene glycol (MIRALAX / GLYCOLAX) packet Take 17 g by mouth daily. 05/04/14  Yes Heath Lark, MD  prochlorperazine (COMPAZINE) 10 MG tablet Take 1 tablet (10 mg total) by mouth every 6 (six) hours as needed (Nausea or vomiting). 05/04/14  Yes Heath Lark, MD  sodium fluoride (FLUORISHIELD) 1.1 % GEL  dental gel Instill one drop of fluoride per tooth space of fluoride tray. Place over teeth for 5 minutes. Remove. Spit out excess. Repeat nightly. 05/02/14  Yes Lenn Cal, DDS  fluticasone (FLONASE) 50 MCG/ACT nasal spray Place 2 sprays into both nostrils daily as needed for allergies.  03/29/14   Historical Provider, MD  ipratropium-albuterol (DUONEB) 0.5-2.5 (3) MG/3ML SOLN Take 3 mLs by nebulization every 4 (four) hours as needed (for shortness of breath).  03/07/14   Historical Provider, MD    Assessment/Plan Oral Pharyngeal cancer with obstruction/dysphagia PCM For gastrostomy and porta cath placement 05/18/14. Required tracheostomy for intubation. Local tracheotomy with #6 Shiley, Dr. Janace Hoard 05/18/14 S/p  right subclavian power port insertion and open gastrostomy tube placement, Dr Donne Hazel 05/18/14. Hypertension Depression   Plan:  Dr. Alvy Bimler is on vacation, I have left a call into Dr. Lindi Adie to help [place] with her care.  She isn't taking clears.  She is pretty uncomfortable and on PCA.  WBC is up.  I plan on getting Speech to see for swallow evaluation, ask nutrition to see and evaluate for tube feedings/water.  Continue IV for now breathing rx, check her chest xray.  Give her PO meds via gastrostomy tube. Recheck labs in Am and get Case management involved.      LOS: 1 day    Ed Mandich 05/19/2014

## 2014-05-19 NOTE — Progress Notes (Signed)
Pain under fair control.  She is deconditioned significantly.  dont know what plan is from oncologic standpoint right now. Will treat with nebs for wheezing today, can start using g tube today, speech to evaluate with trach in place.  Will begin dc planning and figure out what oncology wants to do in terms of treatment.

## 2014-05-19 NOTE — Progress Notes (Signed)
Patient ID: Stacey Carroll, female   DOB: [birth year], 53 y.o.   MRN: [id number] No bleeding. Good airway. Will change trach to cuffless on Monday and Dr Lanell Persons wants to start radiation on Tuesday or Wednesday

## 2014-05-19 NOTE — Evaluation (Signed)
SLP Cancellation Note  Patient Details Name: Stacey Carroll MRN: [id number] DOB: [birth year]   Cancelled treatment:       Reason Eval/Treat Not Completed: Patient not medically ready;Medical issues which prohibited therapy. Discussed pt status with RN. Pt had #6 Shiley trach placed 05/18/14. Earlier this morning, pt had extensive bleeding from trach site following forceful coughing.  BSE ordered to evaluate pt swallow function and safety. At this time, pt is inappropriate for swallow evaluation, as recent MBS (04/27/14) indicated moderate-severe pharyngeal dysphagia. Significant change in swallow function is unlikely at this point. Recommend instead orders for Passy-Muir Speaking Valve to facilitate effective verbal communication, and referral to outpatient speech therapy for education re: the effects of radiation on swallow function and safety, methods to discourage atrophy and/or fibrosis, and therapeutic exercises to minimize deterioration of integrity, strength and function of oropharyngeal structures.   MD - Please order PMSV evaluation when pt tolerating cuff deflation.  Celia B. Quentin Ore [facility], CCC-SLP [phone]  Shonna Chock 05/19/2014, 2:27 PM

## 2014-05-20 LAB — COMPREHENSIVE METABOLIC PANEL
ALT: 9 U/L (ref 0–35)
ANION GAP: 15 (ref 5–15)
AST: 17 U/L (ref 0–37)
Albumin: 2.8 g/dL — ABNORMAL LOW (ref 3.5–5.2)
Alkaline Phosphatase: 100 U/L (ref 39–117)
BUN: 6 mg/dL (ref 6–23)
CO2: 23 meq/L (ref 19–32)
CREATININE: 0.63 mg/dL (ref 0.50–1.10)
Calcium: 9.6 mg/dL (ref 8.4–10.5)
Chloride: 101 mEq/L (ref 96–112)
GFR calc Af Amer: >90 mL/min (ref >90)
GLUCOSE: 125 mg/dL — AB (ref 70–99)
Potassium: 4.2 mEq/L (ref 3.7–5.3)
SODIUM: 139 meq/L (ref 137–147)
TOTAL PROTEIN: 6.6 g/dL (ref 6.0–8.3)
Total Bilirubin: 0.4 mg/dL (ref 0.3–1.2)

## 2014-05-20 LAB — GLUCOSE, CAPILLARY
GLUCOSE-CAPILLARY: 157 mg/dL — AB (ref 70–99)
GLUCOSE-CAPILLARY: 120 mg/dL — AB (ref 70–99)
Glucose-Capillary: 141 mg/dL — ABNORMAL HIGH (ref 70–99)
Glucose-Capillary: 118 mg/dL — ABNORMAL HIGH (ref 70–99)
Glucose-Capillary: 99 mg/dL (ref 70–99)

## 2014-05-20 LAB — CBC
HCT: 39.2 % (ref 36.0–46.0)
HEMOGLOBIN: 13.4 g/dL (ref 12.0–15.0)
MCH: 30.9 pg (ref 26.0–34.0)
MCHC: 34.2 g/dL (ref 30.0–36.0)
MCV: 90.3 fL (ref 78.0–100.0)
Platelets: 353 10*3/uL (ref 150–400)
RBC: 4.34 MIL/uL (ref 3.87–5.11)
RDW: 13.1 % (ref 11.5–15.5)
WBC: 20.4 10*3/uL — ABNORMAL HIGH (ref 4.0–10.5)

## 2014-05-20 LAB — PREALBUMIN: PREALBUMIN: 3.4 mg/dL — AB (ref 17.0–34.0)

## 2014-05-20 MED ORDER — PANTOPRAZOLE SODIUM 40 MG PO PACK
40.0000 mg | PACK | Freq: Every day | ORAL | Status: DC
  Administered 2014-05-20 – 2014-05-23 (×4): 40 mg
  Filled 2014-05-20 (×4): qty 20

## 2014-05-20 MED ORDER — ONDANSETRON 4 MG PO TBDP
4.0000 mg | ORAL_TABLET | Freq: Three times a day (TID) | ORAL | Status: DC | PRN
Start: 2014-05-20 — End: 2014-05-23
  Filled 2014-05-20: qty 2

## 2014-05-20 MED ORDER — DIAZEPAM 1 MG/ML PO SOLN
5.0000 mg | Freq: Four times a day (QID) | ORAL | Status: DC | PRN
  Administered 2014-05-20 – 2014-05-23 (×4): 5 mg via ORAL
  Filled 2014-05-20 (×4): qty 5

## 2014-05-20 MED ORDER — MORPHINE SULFATE (CONCENTRATE) 10 MG /0.5 ML PO SOLN
20.0000 mg | ORAL | Status: DC | PRN
  Administered 2014-05-21 – 2014-05-23 (×9): 20 mg
  Filled 2014-05-20 (×9): qty 1

## 2014-05-20 NOTE — Progress Notes (Signed)
Patient ID: Stacey Carroll, female   DOB: [birth year], 53 y.o.   MRN: [id number] [name] open and no further bleeding. Change trach to cuffless tomorrow.

## 2014-05-20 NOTE — Progress Notes (Signed)
2 Days Post-Op  Subjective: She looks worn out today,she didn't even try to talk this AM.  Still some blood on her wash cloth at the base of the trach.  So I think it's still oozing some, but there are 2 skin stitches at the lower portion of the trach so I can't really pull it up to look.  She is on the tube feeds and the gastrostomy site looks good.  Objective: Vital signs in last 24 hours: Temp:  [98.2 F (36.8 C)-98.7 F (37.1 C)] 98.4 F (36.9 C) (08/15 0700) Pulse Rate:  [85-123] 85 (08/15 0927) Resp:  [17-30] 17 (08/15 0927) BP: (156-174)/(81-96) 166/92 mmHg (08/15 0927) SpO2:  [97 %-100 %] 98 % (08/15 0927) FiO2 (%):  [28 %] 28 % (08/15 0927) Weight:  [56.1 kg (123 lb 10.9 oz)] 56.1 kg (123 lb 10.9 oz) (08/15 0700)   355 thru NG yesterday Afebrile, BP is up CMP OK, her WBC is still up. Intake/Output from previous day: 08/14 0701 - 08/15 0700 In: 2088.2 [I.V.:1732.5; NG/GT:355.7] Out: 900 [Urine:900] Intake/Output this shift: Total I/O In: 105 [I.V.:75; NG/GT:30] Out: -   General appearance: alert, cooperative and tired Resp: clear to auscultation bilaterally and no wheezing this AM GI: soft, tender, + BS, gastrostomy and incision look fine.  Lab Results:   Recent Labs  05/18/14 1427 05/20/14 0314  WBC 22.0* 20.4*  HGB 14.4 13.4  HCT 42.6 39.2  PLT 353 353    BMET  Recent Labs  05/18/14 0831 05/20/14 0314  NA 139 139  K 3.6* 4.2  CL  --  101  CO2  --  23  GLUCOSE 71 125*  BUN  --  6  CREATININE  --  0.63  CALCIUM  --  9.6   PT/INR  Recent Labs  05/19/14 1715  LABPROT 15.1  INR 1.19     Recent Labs Lab 05/20/14 0314  AST 17  ALT 9  ALKPHOS 100  BILITOT 0.4  PROT 6.6  ALBUMIN 2.8*     Lipase  No results found for this basename: lipase     Studies/Results: Dg Chest 2 View  05/19/2014   CLINICAL DATA:  Postoperative wheezing  EXAM: CHEST  2 VIEW  COMPARISON:  05/18/2014  FINDINGS: Tracheostomy tube and right-sided chest wall  port are again seen and stable. Postsurgical changes in the cervical spine are seen. The cardiac shadow is within normal limits. No pneumothorax or sizable infiltrate is seen. Free air air is noted beneath the hemidiaphragms consistent with the recent gastrostomy placement it is relatively stable from the prior exam.  IMPRESSION: Postoperative change as described. Persistent free air is noted consistent with the recent gastrostomy placement. This finding was described on the previous exam.   Electronically Signed   By: Inez Catalina M.D.   On: 05/19/2014 09:58   Dg Chest Port 1 View  05/18/2014   CLINICAL DATA:  Port-A-Cath placement  EXAM: PORTABLE CHEST - 1 VIEW  COMPARISON:  04/14/2014  FINDINGS: Cardiomediastinal silhouette is stable. Tracheostomy tube in place. No acute infiltrate or pleural effusion. No pulmonary edema. There is a right subclavian Port-A-Cath with tip in right atrium. No pneumothorax. Small amount of free air under right hemidiaphragm probable postsurgical in nature. Clinical correlation is necessary.  IMPRESSION: Tracheostomy tube in place. No acute infiltrate or pleural effusion. No pulmonary edema. There is a right subclavian Port-A-Cath with tip in right atrium. No pneumothorax. Small amount of free air under right hemidiaphragm probable postsurgical  in nature. Clinical correlation is necessary.   Electronically Signed   By: Lahoma Crocker M.D.   On: 05/18/2014 12:40   Dg Fluoro Guide Cv Line-no Report  05/18/2014   CLINICAL DATA: power port placement   FLOURO GUIDE CV LINE  Fluoroscopy was utilized by the requesting physician.  No radiographic  interpretation.     Medications: . antiseptic oral rinse  7 mL Mouth Rinse q12n4p  . chlorhexidine  15 mL Mouth Rinse BID  . enoxaparin (LOVENOX) injection  40 mg Subcutaneous Q24H  . morphine   Intravenous 6 times per day  . pantoprazole (PROTONIX) IV  40 mg Intravenous QHS  . PARoxetine  10 mg Per Tube Daily   . sodium chloride 75  mL/hr at 05/19/14 1900  . feeding supplement (JEVITY 1.2 CAL) 1,000 mL (05/19/14 1900)   Prior to Admission medications   Medication Sig Start Date End Date Taking? Authorizing Provider  diazepam (VALIUM) 10 MG tablet Take 5 mg by mouth every 6 (six) hours as needed for anxiety.    Yes Historical Provider, MD  lidocaine-prilocaine (EMLA) cream Apply 1 application topically as needed. 05/04/14  Yes Heath Lark, MD  morphine (ROXANOL) 20 MG/ML concentrated solution Take 1 mL (20 mg total) by mouth every 2 (two) hours as needed for severe pain. 05/04/14  Yes Heath Lark, MD  ondansetron (ZOFRAN) 8 MG tablet Take 1 tablet (8 mg total) by mouth every 8 (eight) hours as needed for nausea. 05/04/14  Yes Heath Lark, MD  paroxetine (PAXIL) 10 MG/5ML suspension Take 20 mg by mouth every morning.   Yes Historical Provider, MD  polyethylene glycol (MIRALAX / GLYCOLAX) packet Take 17 g by mouth daily. 05/04/14  Yes Heath Lark, MD  prochlorperazine (COMPAZINE) 10 MG tablet Take 1 tablet (10 mg total) by mouth every 6 (six) hours as needed (Nausea or vomiting). 05/04/14  Yes Heath Lark, MD  sodium fluoride (FLUORISHIELD) 1.1 % GEL dental gel Instill one drop of fluoride per tooth space of fluoride tray. Place over teeth for 5 minutes. Remove. Spit out excess. Repeat nightly. 05/02/14  Yes Lenn Cal, DDS  fluticasone (FLONASE) 50 MCG/ACT nasal spray Place 2 sprays into both nostrils daily as needed for allergies.  03/29/14   Historical Provider, MD  ipratropium-albuterol (DUONEB) 0.5-2.5 (3) MG/3ML SOLN Take 3 mLs by nebulization every 4 (four) hours as needed (for shortness of breath).  03/07/14   Historical Provider, MD     Assessment/Plan Oral Pharyngeal cancer with obstruction/dysphagia  PCM  For gastrostomy and porta cath placement 05/18/14. Required tracheostomy for intubation.  Local tracheotomy with #6 Shiley, Dr. Janace Hoard 05/18/14  S/p right subclavian power port insertion and open gastrostomy tube  placement, Dr Donne Hazel 05/18/14.  Hypertension  Depression   Plan:  Nutrition can go forward with routine increase of her tube feeds and get her up to goal.  I will ask PT to see and help mobilize her.  She and her son will need to begin learning how to do tube feeds and maintain site.  We will work on discharge when she is ready from Dr. Janace Hoard standpoint. I am going to stop PCA, she has oral Morphine solution she can take per tube and IV MS as back up.  LOS: 2 days    Steadman Prosperi 05/20/2014

## 2014-05-20 NOTE — Progress Notes (Signed)
Imogene Burn. Georgette Dover, MD, [facility] Surgery  General/ Trauma Surgery  05/20/2014 2:43 PM

## 2014-05-21 DIAGNOSIS — E876 Hypokalemia: Secondary | ICD-10-CM

## 2014-05-21 LAB — GLUCOSE, CAPILLARY
Glucose-Capillary: 150 mg/dL — ABNORMAL HIGH (ref 70–99)
Glucose-Capillary: 141 mg/dL — ABNORMAL HIGH (ref 70–99)
Glucose-Capillary: 121 mg/dL — ABNORMAL HIGH (ref 70–99)
Glucose-Capillary: 163 mg/dL — ABNORMAL HIGH (ref 70–99)
Glucose-Capillary: 100 mg/dL — ABNORMAL HIGH (ref 70–99)
Glucose-Capillary: 125 mg/dL — ABNORMAL HIGH (ref 70–99)

## 2014-05-21 LAB — BASIC METABOLIC PANEL
ANION GAP: 12 (ref 5–15)
BUN: 7 mg/dL (ref 6–23)
CHLORIDE: 101 meq/L (ref 96–112)
CO2: 26 mEq/L (ref 19–32)
Calcium: 9 mg/dL (ref 8.4–10.5)
Creatinine, Ser: 0.46 mg/dL — ABNORMAL LOW (ref 0.50–1.10)
GFR calc Af Amer: >90 mL/min (ref >90)
Glucose, Bld: 135 mg/dL — ABNORMAL HIGH (ref 70–99)
POTASSIUM: 2.8 meq/L — AB (ref 3.7–5.3)
SODIUM: 139 meq/L (ref 137–147)

## 2014-05-21 LAB — CBC
HEMATOCRIT: 35.3 % — AB (ref 36.0–46.0)
Hemoglobin: 12 g/dL (ref 12.0–15.0)
MCH: 29.9 pg (ref 26.0–34.0)
MCHC: 34 g/dL (ref 30.0–36.0)
MCV: 87.8 fL (ref 78.0–100.0)
Platelets: 343 10*3/uL (ref 150–400)
RBC: 4.02 MIL/uL (ref 3.87–5.11)
RDW: 13.1 % (ref 11.5–15.5)
WBC: 14.7 10*3/uL — AB (ref 4.0–10.5)

## 2014-05-21 LAB — MAGNESIUM: MAGNESIUM: 1.9 mg/dL (ref 1.5–2.5)

## 2014-05-21 MED ORDER — POTASSIUM CHLORIDE 20 MEQ/15ML (10%) PO LIQD
30.0000 meq | Freq: Three times a day (TID) | ORAL | Status: DC
  Administered 2014-05-21 – 2014-05-22 (×4): 30 meq
  Filled 2014-05-21 (×6): qty 22.5

## 2014-05-21 MED ORDER — POTASSIUM CHLORIDE 10 MEQ/100ML IV SOLN
10.0000 meq | INTRAVENOUS | Status: AC
  Administered 2014-05-21 (×3): 10 meq via INTRAVENOUS
  Filled 2014-05-21 (×2): qty 100

## 2014-05-21 MED ORDER — POTASSIUM CHLORIDE IN NACL 40-0.9 MEQ/L-% IV SOLN
INTRAVENOUS | Status: DC
  Filled 2014-05-21 (×2): qty 1000

## 2014-05-21 NOTE — Progress Notes (Signed)
I have seen and examined the pt and agree with PA-Jenning's progress note. Pt looks well.  Tol TFs OK for floor if Ok with Dr. Janace Hoard.

## 2014-05-21 NOTE — Evaluation (Signed)
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 Pt s/p  tracheostomy, open gastrostomy and a port placement on 8/13.  Clinical Impression  Pt s/p above surgeries. Pt tolerating mobility well and ambulating with RN staff. Suspect pt to progress well enough to d/c home with assist of son. Acute PT to follow to progress ambulation to least restrictive device.    Follow Up Recommendations No PT follow up;Supervision/Assistance - 24 hour    Equipment Recommendations   (TBD)    Recommendations for Other Services       Precautions / Restrictions Precautions Precautions: Fall Precaution Comments: trach Restrictions Weight Bearing Restrictions: No      Mobility  Bed Mobility Overal bed mobility:  (pt up in chair upon PT arrival)                Transfers Overall transfer level: Needs assistance Equipment used: None Transfers: Sit to/from Stand Sit to Stand: Min guard         General transfer comment: increased time, min guard for safety  Ambulation/Gait Ambulation/Gait assistance: Min guard Ambulation Distance (Feet): 200 Feet Assistive device:  (pushed IV pole) Gait Pattern/deviations: Step-through pattern;Decreased dorsiflexion - right;Trunk flexed   Gait velocity interpretation: Below normal speed for age/gender General Gait Details: pt declined use of RW but dependent on IV pole. Pt with good tolerance, had 2 episodes of coughing. no episodes of LOB. c/o abdominal pain  Stairs            Wheelchair Mobility    Modified Rankin (Stroke Patients Only)       Balance                                             Pertinent Vitals/Pain Pain Assessment:  (reports "it's not to bad")    Home  Living Family/patient expects to be discharged to:: Private residence Living Arrangements: Children Available Help at Discharge: Family;Available 24 hours/day Type of Home: House Home Access: Stairs to enter Entrance Stairs-Rails: None Entrance Stairs-Number of Steps: 2 Home Layout: One level Home Equipment: None      Prior Function Level of Independence: Independent               Hand Dominance   Dominant Hand: Right    Extremity/Trunk Assessment   Upper Extremity Assessment: Overall WFL for tasks assessed           Lower Extremity Assessment: Overall WFL for tasks assessed      Cervical / Trunk Assessment: Normal  Communication   Communication: Tracheostomy  Cognition Arousal/Alertness: Awake/alert Behavior During Therapy: WFL for tasks assessed/performed Overall Cognitive Status: Within Functional Limits for tasks assessed                      General Comments      Exercises        Assessment/Plan    PT Assessment Patient needs continued PT services  PT Diagnosis Difficulty walking   PT Problem List Decreased strength;Decreased activity tolerance;Decreased mobility  PT Treatment Interventions DME instruction;Gait training;Stair training;Therapeutic activities;Therapeutic exercise;Balance training;Functional mobility training   PT Goals (Current goals can be found in the Care  Plan section) Acute Rehab PT Goals Patient Stated Goal: home PT Goal Formulation: With patient Time For Goal Achievement: 05/28/14 Potential to Achieve Goals: Good    Frequency Min 3X/week   Barriers to discharge        Co-evaluation               End of Session   Activity Tolerance: Patient tolerated treatment well Patient left: in chair;with call bell/phone within reach;with family/visitor present Nurse Communication: Mobility status         Time: 0947-0962 PT Time Calculation (min): 17 min   Charges:   PT Evaluation $Initial PT Evaluation  Tier I: 1 Procedure PT Treatments $Gait Training: 8-22 mins   PT G CodesKingsley Callander 05/21/2014, 4:51 PM  Kittie Plater, PT, DPT Pager #: [phone] Office #: [phone]

## 2014-05-21 NOTE — Progress Notes (Signed)
3 Days Post-Op  Subjective: She seems better.  She has walked some today. NO BM, TF should be up to rate shortly.  She had some constipation preop, she has been on MS at home.  She says oral/TF morphine is working for her.  Objective: Vital signs in last 24 hours: Temp:  [98.1 F (36.7 C)-98.7 F (37.1 C)] 98.7 F (37.1 C) (08/16 0749) Pulse Rate:  [83-102] 87 (08/16 0749) Resp:  [16-22] 18 (08/16 0749) BP: (145-167)/(84-97) 145/92 mmHg (08/16 0749) SpO2:  [95 %-99 %] 98 % (08/16 0749) FiO2 (%):  [28 %] 28 % (08/16 0749) Weight:  [57 kg (125 lb 10.6 oz)] 57 kg (125 lb 10.6 oz) (08/16 0432)  530 ml per PEG recorded yesterday. Afebrile, VSS, BP still up  I/O is up about 3 liters K+ is down to 2.8, started on K+ replacement looks like she has had 30 meq IV so far. WBC is improving  Intake/Output from previous day: 08/15 0701 - 08/16 0700 In: 2105 [I.V.:1575; NG/GT:530] Out: 1026 [Urine:1026] Intake/Output this shift:    General appearance: alert, cooperative and no distress Resp: clear to auscultation bilaterally GI: soft, sore, No Bm some constipation preop. Site looks fine no distension + BS  Lab Results:   Recent Labs  05/20/14 0314 05/21/14 0335  WBC 20.4* 14.7*  HGB 13.4 12.0  HCT 39.2 35.3*  PLT 353 343    BMET  Recent Labs  05/20/14 0314 05/21/14 0335  NA 139 139  K 4.2 2.8*  CL 101 101  CO2 23 26  GLUCOSE 125* 135*  BUN 6 7  CREATININE 0.63 0.46*  CALCIUM 9.6 9.0   PT/INR  Recent Labs  05/19/14 1715  LABPROT 15.1  INR 1.19     Recent Labs Lab 05/20/14 0314  AST 17  ALT 9  ALKPHOS 100  BILITOT 0.4  PROT 6.6  ALBUMIN 2.8*     Lipase  No results found for this basename: lipase     Studies/Results: No results found.  Medications: . antiseptic oral rinse  7 mL Mouth Rinse q12n4p  . chlorhexidine  15 mL Mouth Rinse BID  . enoxaparin (LOVENOX) injection  40 mg Subcutaneous Q24H  . pantoprazole sodium  40 mg Per Tube Daily  .  PARoxetine  10 mg Per Tube Daily    Assessment/Plan Oral Pharyngeal cancer with obstruction/dysphagia  PCM  For gastrostomy and porta cath placement 05/18/14. Required tracheostomy for intubation.  Local tracheotomy with #6 Shiley, Dr. Janace Hoard 05/18/14  S/p right subclavian power port insertion and open gastrostomy tube placement, Dr Donne Hazel 05/18/14.  Hypertension  Depression   Plan:  Saline lock IV, KCl per TF, continue to mobilize, recheck labs later today.  They want to move her off stepdown.  I will keep her on telem with her low k+.  If ok with Dr. Janace Hoard, she needs to be somewhere they are familiar with trachs.     LOS: 3 days    Tishie Altmann 05/21/2014

## 2014-05-22 ENCOUNTER — Telehealth: Payer: Self-pay | Admitting: *Deleted

## 2014-05-22 ENCOUNTER — Encounter: Payer: Self-pay | Admitting: Nutrition

## 2014-05-22 ENCOUNTER — Ambulatory Visit: Payer: BC Managed Care – PPO

## 2014-05-22 ENCOUNTER — Ambulatory Visit: Payer: BC Managed Care – PPO | Admitting: Radiation Oncology

## 2014-05-22 ENCOUNTER — Ambulatory Visit: Payer: Self-pay

## 2014-05-22 DIAGNOSIS — K5900 Constipation, unspecified: Secondary | ICD-10-CM

## 2014-05-22 DIAGNOSIS — Z510 Encounter for antineoplastic radiation therapy: Secondary | ICD-10-CM | POA: Diagnosis not present

## 2014-05-22 DIAGNOSIS — D649 Anemia, unspecified: Secondary | ICD-10-CM

## 2014-05-22 DIAGNOSIS — C100 Malignant neoplasm of vallecula: Principal | ICD-10-CM

## 2014-05-22 DIAGNOSIS — D72829 Elevated white blood cell count, unspecified: Secondary | ICD-10-CM

## 2014-05-22 DIAGNOSIS — E43 Unspecified severe protein-calorie malnutrition: Secondary | ICD-10-CM

## 2014-05-22 LAB — GLUCOSE, CAPILLARY
GLUCOSE-CAPILLARY: 95 mg/dL (ref 70–99)
GLUCOSE-CAPILLARY: 128 mg/dL — AB (ref 70–99)
GLUCOSE-CAPILLARY: 111 mg/dL — AB (ref 70–99)
GLUCOSE-CAPILLARY: 134 mg/dL — AB (ref 70–99)
Glucose-Capillary: 120 mg/dL — ABNORMAL HIGH (ref 70–99)
Glucose-Capillary: 129 mg/dL — ABNORMAL HIGH (ref 70–99)
Glucose-Capillary: 141 mg/dL — ABNORMAL HIGH (ref 70–99)

## 2014-05-22 LAB — COMPREHENSIVE METABOLIC PANEL
ALBUMIN: 2.2 g/dL — AB (ref 3.5–5.2)
ALK PHOS: 77 U/L (ref 39–117)
ALT: 9 U/L (ref 0–35)
AST: 12 U/L (ref 0–37)
Anion gap: 7 (ref 5–15)
BILIRUBIN TOTAL: 0.2 mg/dL — AB (ref 0.3–1.2)
BUN: 10 mg/dL (ref 6–23)
CHLORIDE: 102 meq/L (ref 96–112)
CO2: 28 meq/L (ref 19–32)
Calcium: 9.1 mg/dL (ref 8.4–10.5)
Creatinine, Ser: 0.51 mg/dL (ref 0.50–1.10)
GFR calc Af Amer: >90 mL/min (ref >90)
GFR calc non Af Amer: >90 mL/min (ref >90)
Glucose, Bld: 149 mg/dL — ABNORMAL HIGH (ref 70–99)
Potassium: 4.2 mEq/L (ref 3.7–5.3)
Sodium: 137 mEq/L (ref 137–147)
Total Protein: 5.6 g/dL — ABNORMAL LOW (ref 6.0–8.3)

## 2014-05-22 LAB — CBC
HEMATOCRIT: 33.1 % — AB (ref 36.0–46.0)
Hemoglobin: 11.2 g/dL — ABNORMAL LOW (ref 12.0–15.0)
MCH: 30.7 pg (ref 26.0–34.0)
MCHC: 33.8 g/dL (ref 30.0–36.0)
MCV: 90.7 fL (ref 78.0–100.0)
PLATELETS: 309 10*3/uL (ref 150–400)
RBC: 3.65 MIL/uL — AB (ref 3.87–5.11)
RDW: 13.4 % (ref 11.5–15.5)
WBC: 11.3 10*3/uL — AB (ref 4.0–10.5)

## 2014-05-22 MED ORDER — ACETAMINOPHEN 160 MG/5ML PO SOLN: 650.0000 mg | ORAL | Status: DC | PRN

## 2014-05-22 MED ORDER — SENNOSIDES 8.8 MG/5ML PO SYRP
10.0000 mL | ORAL_SOLUTION | Freq: Every evening | ORAL | Status: DC | PRN
  Filled 2014-05-22: qty 10

## 2014-05-22 MED ORDER — BISACODYL 10 MG RE SUPP: 10.0000 mg | Freq: Once | RECTAL | Status: DC

## 2014-05-22 MED ORDER — POTASSIUM CHLORIDE 20 MEQ/15ML (10%) PO LIQD
20.0000 meq | Freq: Every day | ORAL | Status: DC
  Administered 2014-05-23: 20 meq
  Filled 2014-05-22: qty 15

## 2014-05-22 NOTE — Progress Notes (Signed)
Stacey Carroll   DOB:04/05/1961   PO#:[id number]   ERX#:[id number]  I have seen the patient, examined her and edited the notes as follows  Subjective: Events since 8/14 noted. She denies nausea or vomiting. Her G tube is working properly, tolerating tube feedings. She still has not have a bowel movement since admission. Pain well controlled. Currently, she denies any bleeding issues. She had some oozing at the tracheostomy site on prior days, but this resolved. She still has some secretions visible, but no blood or odor noted. Denies any fevers. No confusion was noted. She is ambulating with assistance. Eager to start chemo and radiation soon.    Scheduled Meds: . antiseptic oral rinse  7 mL Mouth Rinse q12n4p  . chlorhexidine  15 mL Mouth Rinse BID  . enoxaparin (LOVENOX) injection  40 mg Subcutaneous Q24H  . pantoprazole sodium  40 mg Per Tube Daily  . PARoxetine  10 mg Per Tube Daily  . potassium chloride  30 mEq Per Tube TID   Continuous Infusions: . 0.9 % NaCl with KCl 40 mEq / L    . feeding supplement (JEVITY 1.2 CAL) 1,000 mL (05/19/14 1900)   PRN Meds:.acetaminophen, acetaminophen, diazepam, fluticasone, ipratropium-albuterol, morphine injection, morphine CONCENTRATE, ondansetron, ondansetron   Objective:  Filed Vitals:   05/22/14 0504  BP: 122/82  Pulse: 83  Temp: 97.8 F (36.6 C)  Resp: 16      Intake/Output Summary (Last 24 hours) at 05/22/14 0706 Last data filed at 05/22/14 0500  Gross per 24 hour  Intake    825 ml  Output      0 ml  Net    825 ml    ECOG PERFORMANCE STATUS: 2  GENERAL: alert, no distress and comfortable. Flat affect. Trach tube present without abnormal secretions. No bleeding noted.  SKIN: skin color, texture, turgor are normal, no rashes or significant lesions  EYES: normal, conjunctiva are pink and non-injected, sclera clear  OROPHARYNX: no exudate, no erythema and lips, buccal mucosa, and tongue normal without thrush NECK: supple, thyroid  normal size, non-tender, without nodularity  LYMPH: no palpable lymphadenopathy in the cervical, axillary or inguinal  LUNGS: trace of bibasilar crackles at the bases otherwise no wheezing or rhonchi noted.  HEART: regular rate & rhythm and no murmurs and no lower extremity edema. Right Port -A -Cath non tender  ABDOMEN  soft, non-tender and normal bowel sounds. Gastric tube non tender.  Musculoskeletal:no cyanosis of digits and no clubbing  PSYCH: alert & oriented x 3 with fluent speech  NEURO: no focal motor/sensory deficits     CBG (last 3)   Recent Labs  05/21/14 2020 05/21/14 2356 05/22/14 0502  GLUCAP 125* 141* 95     Labs:   Recent Labs Lab 05/18/14 0831 05/18/14 1427 05/20/14 0314 05/21/14 0335 05/22/14 0310  WBC  --  22.0* 20.4* 14.7* 11.3*  HGB 14.3 14.4 13.4 12.0 11.2*  HCT 42.0 42.6 39.2 35.3* 33.1*  PLT  --  353 353 343 309  MCV  --  91.2 90.3 87.8 90.7  MCH  --  30.8 30.9 29.9 30.7  MCHC  --  33.8 34.2 34.0 33.8  RDW  --  13.1 13.1 13.1 13.4     Chemistries:    Recent Labs Lab 05/18/14 0831 05/20/14 0314 05/21/14 0335 05/22/14 0310  NA 139 139 139 137  K 3.6* 4.2 2.8* 4.2  CL  --  101 101 102  CO2  --  23 26 28  GLUCOSE 71 125* 135* 149*  BUN  --  6 7 10   CREATININE  --  0.63 0.46* 0.51  CALCIUM  --  9.6 9.0 9.1  MG  --   --  1.9  --   AST  --  17  --  12  ALT  --  9  --  9  ALKPHOS  --  100  --  77  BILITOT  --  0.4  --  0.2*    GFR Estimated Creatinine Clearance: 72.2 ml/min (by C-G formula based on Cr of 0.51).  Liver Function Tests:  Recent Labs Lab 05/20/14 0314 05/22/14 0310  AST 17 12  ALT 9 9  ALKPHOS 100 77  BILITOT 0.4 0.2*  PROT 6.6 5.6*  ALBUMIN 2.8* 2.2*   Coagulation profile  Recent Labs Lab 05/19/14 1715  INR 1.19    CBG:  Recent Labs Lab 05/21/14 1213 05/21/14 1549 05/21/14 2020 05/21/14 2356 05/22/14 0502  GLUCAP 163* 100* 125* 141* 95        Imaging Studies:  No results  found.  Assessment/Plan: 53 y.o.  Carcinoma of Vallecula Epiglottica  She had a Port-A Cath placement on 8/10 and feeding tube on 9/16 without complications in anticipation for chemotherapy  CT simulation took place on 8/3 by Dr. Isidore Moos with plans for radiation to tumor and bilateral neck nodes total planned of 70 G in 35 fractions.  She was to receive chemo on 8/12 with Cisplatin every 21 days along with radiation.  Chemo plans are on hold until clinically stable. Plan to start chemo as out-patient on 05/24/14 Radiation may start within the next 24-48 hours as per Dr. Isidore Moos.  Malnutrition, severe  Due to acute illness  Appreciate dietitian consult  Patient on PEG tube feedings. Tolerating well   Pharyngeal dysphagia  Was moderate to severe on admission Patient had a tracheostomy on 8/13, tolerating procedure well, without complications or increased pain. She is now on cuffless trach as per Surgery Controlled with morphine sulfate IV for pain.   Leukocytosis  Likely reactive, in the setting of inflammation, recent procedures, pain and wheezing  Trending back to baseline.  Continue to monitor  Anemia, Mild This is due to acute illness, dilution, polypharmacy, minimal oozing at the trach site. No obvious areas of acute bleeding. Her Hb is stable No transfusion is indicated at this time Will continue to monitor.  Constipation Secondary to pain meds, poor nutritional status and decreased mobility due to acute illness.  Last bowel movement was on day of admission.  She is well hydrated.  Consider docusate and Senna in liquid form via G tube for resolution of symptoms before proceeding with enema.   Situational Depression  On Paroxetine   DVT prophylaxis  Continue Lovenox   Full Code  Other medical issues as per admitting team   Will sign off. OK to discharge patient from my standpoint. Follow-up in clinic on 8/19   **Disclaimer: This note was dictated with voice  recognition software. Similar sounding words can inadvertently be transcribed and this note may contain transcription errors which may not have been corrected upon publication of note.Sharene Butters E, PA-C 05/22/2014  7:06 AM Silvano Garofano, MD 05/22/2014

## 2014-05-22 NOTE — Progress Notes (Signed)
Trach education started at this time.  Son at bedside with pt. Both seem to understand trach education well. No barriers. [name] book given to son to take home to have for reference if needed.  Would like to follow back up with pt and son before discharge, Respiratory therapy number given to son to call in am to set up time to meet before pt is discharged.

## 2014-05-22 NOTE — Progress Notes (Signed)
Pt has extra trach in room, ambu bag, and obturator. PT continuous pulse ox reading 94% on RA as pt is capped at this time. Talking and tolerating well. RT will continue to monitor.

## 2014-05-22 NOTE — Progress Notes (Signed)
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 Pt s/p  tracheostomy, open gastrostomy and a port placement on 8/13.    PT Comments    Pt moving very well and indicates she has ambulated with Nsg staff since PT saw her last.  Feel pt will continue to improve overall mobility and will not have any needs at D/C.  Will continue to follow along.    Follow Up Recommendations  No PT follow up;Supervision/Assistance - 24 hour     Equipment Recommendations  None recommended by PT    Recommendations for Other Services       Precautions / Restrictions Precautions Precautions: Fall Precaution Comments: trach and g-tube Restrictions Weight Bearing Restrictions: No    Mobility  Bed Mobility Overal bed mobility: Modified Independent                Transfers Overall transfer level: Modified independent Equipment used: None Transfers: Sit to/from Stand Sit to Stand: Modified independent (Device/Increase time)         General transfer comment: pt needs increased time, but able to complete without any physical A.    Ambulation/Gait Ambulation/Gait assistance: Supervision Ambulation Distance (Feet): 300 Feet Assistive device: None Gait Pattern/deviations: Step-through pattern;Decreased stride length     General Gait Details: pt didn't need AD or IV pole today.  pt moving well.  Only limited by 1 episode of coughing and secretions.  O2 sats remained above 95% on RA.     Stairs            Wheelchair Mobility    Modified Rankin (Stroke Patients Only)       Balance                                    Cognition Arousal/Alertness: Awake/alert Behavior During Therapy: WFL for tasks assessed/performed Overall  Cognitive Status: Within Functional Limits for tasks assessed                      Exercises      General Comments        Pertinent Vitals/Pain Pain Assessment:  (pt shakes head)    Home Living                      Prior Function            PT Goals (current goals can now be found in the care plan section) Acute Rehab PT Goals Patient Stated Goal: home PT Goal Formulation: With patient Time For Goal Achievement: 05/28/14 Potential to Achieve Goals: Good Progress towards PT goals: Progressing toward goals    Frequency  Min 3X/week    PT Plan Current plan remains appropriate    Co-evaluation             End of Session   Activity Tolerance: Patient tolerated treatment well Patient left: in bed;with call bell/phone within reach     Time: 6761-9509 PT Time Calculation (min): 18 min  Charges:  $Gait Training: 8-22 mins                    G Codes:      Manjot Hinks F,  PT [phone] 05/22/2014, 9:10 AM

## 2014-05-22 NOTE — Progress Notes (Signed)
4 Days Post-Op  Subjective: From our standpoint she is doing well and could go home with tube feedings.  We are waiting on Dr. Janace Hoard and oncology for next step.  She will need home health and assistance for trach and tube feeds.  Case management requested on 05/19/14.  Objective: Vital signs in last 24 hours: Temp:  [97.8 F (36.6 C)-98.5 F (36.9 C)] 98.2 F (36.8 C) (08/17 0842) Pulse Rate:  [74-122] 112 (08/17 0842) Resp:  [15-30] 30 (08/17 0842) BP: (119-163)/(80-93) 119/80 mmHg (08/17 0842) SpO2:  [93 %-100 %] 100 % (08/17 0842) FiO2 (%):  [28 %] 28 % (08/17 0842) Weight:  [56.9 kg (125 lb 7.1 oz)] 56.9 kg (125 lb 7.1 oz) (08/17 0504)  715 listed per NG . 0.9 % NaCl with KCl 40 mEq / L 10 mL/hr (05/22/14 0600)  . feeding supplement (JEVITY 1.2 CAL) 1,000 mL (05/22/14 0600)   Afebrile HR up some this AM K+ up to 4.2 WBC is improving Pre albumin was 3.4  Intake/Output from previous day: 08/16 0701 - 08/17 0700 In: 825 [I.V.:110; NG/GT:715] Out: -  Intake/Output this shift:    General appearance: alert, cooperative and no distress Resp: clear to auscultation bilaterally GI: G tube working nicely, some redness at one site.  + BS, no BM  Lab Results:   Recent Labs  05/21/14 0335 05/22/14 0310  WBC 14.7* 11.3*  HGB 12.0 11.2*  HCT 35.3* 33.1*  PLT 343 309    BMET  Recent Labs  05/21/14 0335 05/22/14 0310  NA 139 137  K 2.8* 4.2  CL 101 102  CO2 26 28  GLUCOSE 135* 149*  BUN 7 10  CREATININE 0.46* 0.51  CALCIUM 9.0 9.1   PT/INR  Recent Labs  05/19/14 1715  LABPROT 15.1  INR 1.19     Recent Labs Lab 05/20/14 0314 05/22/14 0310  AST 17 12  ALT 9 9  ALKPHOS 100 77  BILITOT 0.4 0.2*  PROT 6.6 5.6*  ALBUMIN 2.8* 2.2*     Lipase  No results found for this basename: lipase     Studies/Results: No results found.  Medications: . antiseptic oral rinse  7 mL Mouth Rinse q12n4p  . chlorhexidine  15 mL Mouth Rinse BID  . enoxaparin  (LOVENOX) injection  40 mg Subcutaneous Q24H  . pantoprazole sodium  40 mg Per Tube Daily  . PARoxetine  10 mg Per Tube Daily  . potassium chloride  30 mEq Per Tube TID    Assessment/Plan Oral Pharyngeal cancer with obstruction/dysphagia  PCM  For gastrostomy and porta cath placement 05/18/14. Required tracheostomy for intubation.  Local tracheotomy with #6 Shiley, Dr. Janace Hoard 05/18/14  S/p right subclavian power port insertion and open gastrostomy tube placement, Dr Donne Hazel 05/18/14.  Hypertension  Depression   Plan:  Will begin planning for discharge.  Trach per Dr. Janace Hoard, once we have a plan for that we can go forward.  Dulcolax supp this AM, she is on chronic Morphine at home. Case Management should be setting up care at home.  She will need home health nurse to help with tube feeds, Trach care, at home.        LOS: 4 days    Stacey Carroll 05/22/2014

## 2014-05-22 NOTE — Progress Notes (Signed)
4 Days Post-Op  Subjective: She is doing well.   Objective: Vital signs in last 24 hours: Temp:  [97.8 F (36.6 C)-98.5 F (36.9 C)] 98.1 F (36.7 C) (08/17 1208) Pulse Rate:  [74-122] 83 (08/17 1208) Resp:  [15-30] 15 (08/17 1208) BP: (119-136)/(80-84) 124/83 mmHg (08/17 1208) SpO2:  [93 %-100 %] 96 % (08/17 1208) FiO2 (%):  [28 %] 28 % (08/17 1208) Weight:  [56.9 kg (125 lb 7.1 oz)] 56.9 kg (125 lb 7.1 oz) (08/17 0504)    Intake/Output from previous day: 08/16 0701 - 08/17 0700 In: 825 [I.V.:110; NG/GT:715] Out: -  Intake/Output this shift: Total I/O In: 245 [I.V.:30; NG/GT:215] Out: -   Trach changed to #6 shiley and plugged. she has good airway and has voice.   Lab Results:   Recent Labs  05/21/14 0335 05/22/14 0310  WBC 14.7* 11.3*  HGB 12.0 11.2*  HCT 35.3* 33.1*  PLT 343 309   BMET  Recent Labs  05/21/14 0335 05/22/14 0310  NA 139 137  K 2.8* 4.2  CL 101 102  CO2 26 28  GLUCOSE 135* 149*  BUN 7 10  CREATININE 0.46* 0.51  CALCIUM 9.0 9.1   PT/INR  Recent Labs  05/19/14 1715  LABPROT 15.1  INR 1.19   ABG No results found for this basename: PHART, PCO2, PO2, HCO3,  in the last 72 hours  Studies/Results: No results found.  Anti-infectives: Anti-infectives   Start     Dose/Rate Route Frequency Ordered Stop   05/18/14 1030  ciprofloxacin (CIPRO) IVPB 400 mg     400 mg 200 mL/hr over 60 Minutes Intravenous To Surgery 05/18/14 1020 05/18/14 1024      Assessment/Plan: s/p Procedure(s): INSERTION PORT-A-CATH (N/A) GASTROSTOMY/PLACEMENT OF G-TUBE (N/A) TRACHEOSTOMY (N/A) i showed her the pieces of the trach and how to get the plug out. She and son need trach training and can be discharged when has the trach supplies at home and feels comfortable about the trach. She should remain plugged today if she can tolerate it. she can have full liquid diet and advance  LOS: 4 days    Melissa Montane 05/22/2014

## 2014-05-22 NOTE — Progress Notes (Signed)
Pt transferred to 6N room 23. Receiving RN at bedside. Pt is stable and vital sings within normal limits.

## 2014-05-22 NOTE — Telephone Encounter (Signed)
Spoke with patient in [place].  She is in the process of being transferred to [facility] room 23.  She stated that she is being DC'd tomorrow, an appt is being made to start chemo Wednesday morning.  She is not interested in starting RT until then.   I will follow-up in the morning prior to her DC.  Gayleen Orem, RN, BSN, [place] at [place] [phone]

## 2014-05-22 NOTE — Discharge Instructions (Signed)
Gastrostomy Tube Home Guide A gastrostomy tube is a tube that is surgically placed into the stomach. It is also called a "G-tube." G-tubes are used when a person is unable to eat and drink enough on their own to stay healthy. The tube is inserted into the stomach through a small cut (incision) in the skin. This tube is used for:  Feeding.  Giving medication. GASTROSTOMY TUBE CARE  Wash your hands with soap and water.  Remove the old dressing (if any). Some styles of G-tubes may need a dressing inserted between the skin and the G-tube. Other types of G-tubes do not require a dressing. Ask your health care provider if a dressing is needed.  Check the area where the tube enters the skin (insertion site) for redness, swelling, or pus-like (purulent) drainage. A small amount of clear or tan liquid drainage is normal. Check to make sure scar tissue (skin) is not growing around the insertion site. This could have a raised, bumpy appearance.  A cotton swab can be used to clean the skin around the tube:  When the G-tube is first put in, a normal saline solution or water can be used to clean the skin.  Mild soap and warm water can be used when the skin around the G-tube site has healed.  Roll the cotton swab around the G-tube insertion site to remove any drainage or crusting at the insertion site. STOMACH RESIDUALS Feeding tube residuals are the amount of liquids that are in the stomach at any given time. Residuals may be checked before giving feedings, medications, or as instructed by your health care provider.  Ask your health care provider if there are instances when you would not start tube feedings depending on the amount or type of contents withdrawn from the stomach.  Check residuals by attaching a syringe to the G-tube and pulling back on the syringe plunger. Note the amount, and return the residual back into the stomach. FLUSHING THE G-TUBE  The G-tube should be periodically flushed with  clean warm water to keep it from clogging.  Flush the G-tube after feedings or medications. Draw up 30 mL of warm water in a syringe. Connect the syringe to the G-tube and slowly push the water into the tube.  Do not push feedings, medications, or flushes rapidly. Flush the G-tube gently and slowly.  Only use syringes made for G-tubes to flush medications or feedings.  Your health care provider may want the G-tube flushed more often or with more water. If this is the case, follow your health care provider's instructions. FEEDINGS Your health care provider will determine whether feedings are given as a bolus (a certain amount given at one time and at scheduled times) or whether feedings will be given continuously on a feeding pump.   Formulas should be given at room temperature.  If feedings are continuous, no more than 4 hours worth of feedings should be placed in the feeding bag. This helps prevent spoilage or accidental excess infusion.  Cover and place unused formula in the refrigerator.  If feedings are continuous, stop the feedings when medications or flushes are given. Be sure to restart the feedings.  Feeding bags and syringes should be replaced as instructed by your health care provider. GIVING MEDICATION   In general, it is best if all medications are in a liquid form for G-tube administration. Liquid medications are less likely to clog the G-tube.  Mix the liquid medication with 30 mL (or amount recommended by your  health care provider) of warm water.  Draw up the medication into the syringe.  Attach the syringe to the G-tube and slowly push the mixture into the G-tube.  After giving the medication, draw up 30 mL of warm water in the syringe and slowly flush the G-tube.  For pills or capsules, check with your health care provider first before crushing medications. Some pills are not effective if they are crushed. Some capsules are sustained-release medications.  If  appropriate, crush the pill or capsule and mix with 30 mL of warm water. Using the syringe, slowly push the medication through the tube, then flush the tube with another 30 mL of tap water. G-TUBE PROBLEMS G-tube was pulled out.  Cause: May have been pulled out accidentally.  Solutions: Cover the opening with clean dressing and tape. Call your health care provider right away. The G-tube should be put in as soon as possible (within 4 hours) so the G-tube opening (tract) does not close. The G-tube needs to be put in at a health care setting. An X-ray needs to be done to confirm placement before the G-tube can be used again. Redness, irritation, soreness, or foul odor around the gastrostomy site.  Cause: May be caused by leakage or infection.  Solutions: Call your health care provider right away. Large amount of leakage of fluid or mucus-like liquid present (a large amount means it soaks clothing).  Cause: Many reasons could cause the G-tube to leak.  Solutions: Call your health care provider to discuss the amount of leakage. Skin or scar tissue appears to be growing where tube enters skin.   Cause: Tissue growth may develop around the insertion site if the G-tube is moved or pulled on excessively.  Solutions: Secure tube with tape so that excess movement does not occur. Call your health care provider. G-tube is clogged.  Cause: Thick formula or medication.  Solutions: Try to slowly push warm water into the tube with a large syringe. Never try to push any object into the tube to unclog it. Do not force fluid into the G-tube. If you are unable to unclog the tube, call your health care provider right away. TIPS  Head of bed (HOB) position refers to the upright position of a person's upper body.  When giving medications or a feeding bolus, keep the [facility] up as told by your health care provider. Do this during the feeding and for 1 hour after the feeding or medication administration.  If  continuous feedings are being given, it is best to keep the [facility] up as told by your health care provider. When ADLs (activities of daily living) are performed and the [facility] needs to be flat, be sure to turn the feeding pump off. Restart the feeding pump when the [facility] is returned to the recommended height.  Do not pull or put tension on the tube.  To prevent fluid backflow, kink the G-tube before removing the cap or disconnecting a syringe.  Check the G-tube length every day. Measure from the insertion site to the end of the G-tube. If the length is longer than previous measurements, the tube may be coming out. Call your health care provider if you notice increasing G-tube length.  Oral care, such as brushing teeth, must be continued.  You may need to remove excess air (vent) from the G-tube. Your health care provider will tell you if this is needed.  Always call your health care provider if you have questions or problems with the G-tube.  SEEK IMMEDIATE MEDICAL CARE IF:   You have severe abdominal pain, tenderness, or abdominal bloating (distension).  You have nausea or vomiting.  You are constipated or have problems moving your bowels.  The G-tube insertion site is red, swollen, has a foul smell, or has yellow or brown drainage.  You have difficulty breathing or shortness of breath.  You have a fever.  You have a large amount of feeding tube residuals.  The G-tube is clogged and cannot be flushed. MAKE SURE YOU:   Understand these instructions.  Will watch your condition.  Will get help right away if you are not doing well or get worse. Document Released: 12/01/2001 Document Revised: 02/06/2014 Document Reviewed: 05/30/2013 [facility] Patient Information 2015 [place], [place]. This information is not intended to replace advice given to you by your health care provider. Make sure you discuss any questions you have with your health care provider.  Care of a Tracheostomy Tube Keeping  the tracheostomy tube clean helps prevent infections and keeps certain trach tubing from plugging. A tracheostomy tube is commonly known as a trach tube. You may have one tube (an outer cannula), or you may have two tubes (an outer and inner cannula). The inner cannula that fits inside the outer cannula is removed for cleaning or replacement. Follow your caregiver's directions as to how often you should change and clean your trach tube.  SUPPLIES NEEDED  Towel.  Suction supplies.  Sterile trach care kit.  4x4 inch (10x10 cm) gauze pads.  Sterile cotton-tipped swabs.  Sterile trach bandage (dressing).  Sterile container.  0.9% saline solution.  Small sterile brush (or disposable inner cannula).  Roll of twill tape, trach ties, or trach holder.  Scissors.  Clean gloves.  Sterile gloves. TRACHEOSTOMY CARE  1. Have all supplies ready and available. 2. Wash hands well. 3. Put on clean gloves. 4. Suction the trach tube as needed. 5. When suctioning is complete, remove soiled trach dressings, gloves, and suction catheter. Throw away the dressings and coiled catheter in the glove. To coil the catheter, roll the catheter around the fingers. Then, pull the glove off inside out so that catheter remains coiled in glove. 6. Wash hands well. 7. Put on sterile gloves. 8. Fill a container 0.9% saline solution. 9. Give oxygen as needed. 10. Clean the inner cannula. Nondisposable inner cannula.  While only touching the outer part of the trach tube, unlock and remove the inner cannula.  Drop the inner cannula into 0.9% saline solution. The saline will loosen secretions.  Replace the trach collar, trach tube, or ventilator oxygen source over the outer cannula. Do not attach the trach tube and ventilator oxygen devices to all outer cannulas when the inner cannula is removed.  Quickly pick up the inner cannula out of the saline solution. Use a small brush to remove the secretions on the  inside and outside of the inner cannula.  Hold the inner cannula over the container. Rinse the cannula with 0.9% saline solution.  Replace the inner cannula. Secure the locking mechanism.  Give oxygen as needed. Disposable inner cannula.  Remove the new cannula from the packaging.  Take out the inner cannula while touching only the outer part of the trach tube.  Replace the old cannula with the new cannula. Lock it into position.  Throw away the old cannula.  Give oxygen as needed. 11. Clean the outer cannula surfaces with gauze or cotton swabs, extending 2-4 inches (5-10 cm) in all directions under the neck  plate. Using a cotton swab, clean the stoma site in a circular motion from the stoma site outward. 12. Dry the skin and the outer cannula by patting the area gently with a dry gauze pad. 13. Secure the trach tube with trach ties or a trach tube holder. 14. Place a sterile dressing around the trach site. 15. Give oxygen as needed. 16. Throw away any used supplies. 17. Remove gloves. 18. Wash hands well. Document Released: 11/04/2006 Document Revised: 09/08/2012 Document Reviewed: 04/30/2012 [facility] Patient Information 2015 [place], [place]. This information is not intended to replace advice given to you by your health care provider. Make sure you discuss any questions you have with your health care provider.

## 2014-05-22 NOTE — Progress Notes (Signed)
Discharge planning. Gastrostomy functioning  Imogene Burn. Georgette Dover, MD, [facility] Surgery  General/ Trauma Surgery  05/22/2014 2:07 PM

## 2014-05-22 NOTE — Telephone Encounter (Signed)
Pt's son called me, confirmed that she is being DC'd tomorrow.  He stated that [facility] is delivering equipment/supplies to their home this evening.  He stated that Anne Ng and he will be receiving additional education re: trach care and PEG continuous feeding before her DC.  He stated his understanding that chemo and radiation tmts will begin on Wednesday morning.  I told him I will confirm appts with him tomorrow.    Gayleen Orem, RN, BSN, [place] at [place] [phone]

## 2014-05-22 NOTE — Progress Notes (Signed)
SLP Cancellation Note  Patient Details Name: Stacey Carroll MRN: [id number] DOB: [birth year]   Cancelled treatment:       Reason Eval/Treat Not Completed: Other (comment). Attempted swallow eval per orders. Pt capped, but coughing excessively, struggling to manage secretions. She refused PO trials, but gave more history. States she has been taking very small sips of water as tolerated over the past 3 weeks, but swallowing has gotten progressively worse. She reports she will try ice chips or small sips after d/c home, only if she feels she can tolerate them. She confirms that she has plans to f/u with an outpatient SLP. This therapist will call to confirm. For now, SLP will return to tomorrow to see if pt is willing to participate in assessment.   Herbie Baltimore, [place] CCC-SLP [phone]  Lynann Beaver 05/22/2014, 2:57 PM

## 2014-05-23 ENCOUNTER — Telehealth: Payer: Self-pay | Admitting: *Deleted

## 2014-05-23 ENCOUNTER — Ambulatory Visit: Payer: BC Managed Care – PPO | Admitting: Radiation Oncology

## 2014-05-23 ENCOUNTER — Ambulatory Visit: Payer: BC Managed Care – PPO

## 2014-05-23 LAB — BASIC METABOLIC PANEL
Anion gap: 9 (ref 5–15)
BUN: 13 mg/dL (ref 6–23)
CO2: 31 meq/L (ref 19–32)
Calcium: 9.7 mg/dL (ref 8.4–10.5)
Chloride: 100 mEq/L (ref 96–112)
Creatinine, Ser: 0.62 mg/dL (ref 0.50–1.10)
GFR calc Af Amer: >90 mL/min (ref >90)
GLUCOSE: 107 mg/dL — AB (ref 70–99)
POTASSIUM: 4.2 meq/L (ref 3.7–5.3)
SODIUM: 140 meq/L (ref 137–147)

## 2014-05-23 LAB — GLUCOSE, CAPILLARY
Glucose-Capillary: 125 mg/dL — ABNORMAL HIGH (ref 70–99)
Glucose-Capillary: 144 mg/dL — ABNORMAL HIGH (ref 70–99)

## 2014-05-23 MED ORDER — ACETAMINOPHEN 160 MG/5ML PO SOLN: 650.0000 mg | ORAL | Status: DC | PRN

## 2014-05-23 MED ORDER — SENNOSIDES 8.8 MG/5ML PO SYRP: 10.0000 mL | ORAL_SOLUTION | Freq: Every evening | ORAL | Status: DC | PRN

## 2014-05-23 MED ORDER — JEVITY 1.2 CAL PO LIQD: 1000.0000 mL | ORAL | Status: DC

## 2014-05-23 MED ORDER — ONDANSETRON 4 MG PO TBDP: 4.0000 mg | ORAL_TABLET | Freq: Three times a day (TID) | ORAL | Status: DC | PRN

## 2014-05-23 NOTE — Discharge Summary (Signed)
Ready for discharge.  Imogene Burn. Georgette Dover, MD, [facility] Surgery  General/ Trauma Surgery  05/23/2014 11:25 AM

## 2014-05-23 NOTE — Discharge Summary (Signed)
Physician Discharge Summary  LAKEETA DOBOSZ ZOX:[id number] DOB: Jun 26, 1961 DOA: 05/18/2014  PCP: Jonathon Bellows, MD  Consultation: oncology---Dr. Nicholas Lose  Admit date: 05/18/2014 Discharge date: 05/23/2014  Recommendations for Outpatient Follow-up:    Follow-up Information   Follow up with [place]. [facility]- RN/Resp. arranged (Trach and Tube feeding supplies))    Contact information:   [place] [address] [phone]       Follow up with [facility], NI, MD. Schedule an appointment as soon as possible for a visit in 1 week. (Call Dr.)    Specialty:  Hematology and Oncology   Contact information:   [place] [address] [phone]       Follow up with [facility], MD. Schedule an appointment as soon as possible for a visit in 2 weeks. (Make an appointment for 2-3 weeks.  Call if you have an issue before that with your feeding tube)    Specialty:  General Surgery   Contact information:   [place] [address] [phone]       Schedule an appointment as soon as possible for a visit with Melissa Montane, MD. (1-2 weeks to follow up with your tracheostomy.)    Specialty:  Otolaryngology   Contact information:   [place] [address] [place] [address] [phone]      Discharge Diagnoses:  1. Oropharyngeal cancer with obstruction 2. PCM 3. HTN 4. Depression    Surgical Procedure: Local tracheotomy with #6 [place]---Dr. Janace Hoard 05/18/14    #1 right subclavian power port insertion and open gastrostomy tube placement---Dr. Donne Hazel    05/18/14    Discharge Condition: stable Disposition: home  Diet recommendation: advance diet as tolerated   Filed Weights   05/21/14 0432 05/22/14 0504 05/23/14 0501  Weight: 125 lb 10.6 oz (57 kg) 125 lb 7.1 oz (56.9 kg) 119 lb 3.2 oz (54.069 kg)    Filed Vitals:   05/23/14 0832  BP:   Pulse: 88  Temp:   Resp: 14      Hospital  Course:  Stacey Carroll is a 53 year old female with a history of HTN, depression and obstructive oropharyngeal cancer who originally presented for [facility] and feeding tube placement which was aborted due to  Inability to obtain a secure airway.  Dr. Janace Hoard was consulted for tracheostomy placement and then [facility] and G tube placement.  Post operatively, nutrition was consulted to TF as she was not taking in much by mouth.  Oncology was also consulted, she is scheduled for OP XRT and follow up with Dr. Alvy Bimler.  Discharge planning was initiated for [facility] and patient/family teaching.  Vital signs remained stable.  She was mobilized and pain medication adjusted.  On POD#5 the patient was felt stable for discharge home with TF, she may take PO as tolerated per Dr. Janace Hoard.  Follow up with Dr. Donne Hazel and nurse visit arranged.  She will need to follow up with Dr. Janace Hoard and [facility].  She verbalizes understanding.  She was encouraged to call with questions or concerns.   Physical Exam: General appearance: alert and oriented. Calm and cooperative No acute distress. VSS. Afebrile.  Resp: clear to auscultation bilaterally. Trach in place.   Cardio: S1S1 RRR without murmurs or gallops. No edema. GI: soft round and nontender. +BS x4 quadrants. Midline incision is c/d/i, feeding tube in place, no surrounding erythema.  No organomegaly, hernias or masses.    Discharge Instructions  Medication List         acetaminophen 160 MG/5ML solution  Commonly known as:  TYLENOL  Take 20.3 mLs (650 mg total) by mouth every 4 (four) hours as needed.     diazepam 10 MG tablet  Commonly known as:  VALIUM  Take 5 mg by mouth every 6 (six) hours as needed for anxiety.     feeding supplement (JEVITY 1.2 CAL) Liqd  Place 1,000 mLs into feeding tube continuous.     fluticasone 50 MCG/ACT nasal spray  Commonly known as:  FLONASE  Place 2 sprays into both nostrils daily as needed for allergies.     ipratropium-albuterol 0.5-2.5 (3)  MG/3ML Soln  Commonly known as:  DUONEB  Take 3 mLs by nebulization every 4 (four) hours as needed (for shortness of breath).     lidocaine-prilocaine cream  Commonly known as:  EMLA  Apply 1 application topically as needed.     morphine 20 MG/ML concentrated solution  Commonly known as:  ROXANOL  Take 1 mL (20 mg total) by mouth every 2 (two) hours as needed for severe pain.     ondansetron 4 MG disintegrating tablet  Commonly known as:  ZOFRAN-ODT  Take 1-2 tablets (4-8 mg total) by mouth every 8 (eight) hours as needed for nausea or vomiting.     ondansetron 8 MG tablet  Commonly known as:  ZOFRAN  Take 1 tablet (8 mg total) by mouth every 8 (eight) hours as needed for nausea.     paroxetine 10 MG/5ML suspension  Commonly known as:  PAXIL  Take 20 mg by mouth every morning.     polyethylene glycol packet  Commonly known as:  MIRALAX / GLYCOLAX  Take 17 g by mouth daily.     prochlorperazine 10 MG tablet  Commonly known as:  COMPAZINE  Take 1 tablet (10 mg total) by mouth every 6 (six) hours as needed (Nausea or vomiting).     sennosides 8.8 MG/5ML syrup  Commonly known as:  SENOKOT  Take 10 mLs by mouth at bedtime as needed for mild constipation.     sodium fluoride 1.1 % Gel dental gel  Commonly known as:  FLUORISHIELD  Instill one drop of fluoride per tooth space of fluoride tray. Place over teeth for 5 minutes. Remove. Spit out excess. Repeat nightly.           Follow-up Information   Follow up with [place]. [facility]- RN/Resp. arranged (Trach and Tube feeding supplies))    Contact information:   [place] [address] [phone]       Follow up with [facility], NI, MD. Schedule an appointment as soon as possible for a visit in 1 week. (Call Dr.)    Specialty:  Hematology and Oncology   Contact information:   [place] [address] [phone]       Follow up with [facility], MD. Schedule an  appointment as soon as possible for a visit in 2 weeks. (Make an appointment for 2-3 weeks.  Call if you have an issue before that with your feeding tube)    Specialty:  General Surgery   Contact information:   [place] [address] [phone]       Schedule an appointment as soon as possible for a visit with Melissa Montane, MD. (1-2 weeks to follow up with your tracheostomy.)    Specialty:  Otolaryngology   Contact information:  [address] [place] [address] [phone]        The results of significant diagnostics from this hospitalization (including imaging, microbiology, ancillary and laboratory) are listed below for reference.    Significant Diagnostic Studies: Dg Chest 2 View  05/19/2014   CLINICAL DATA:  Postoperative wheezing  EXAM: CHEST  2 VIEW  COMPARISON:  05/18/2014  FINDINGS: Tracheostomy tube and right-sided chest wall port are again seen and stable. Postsurgical changes in the cervical spine are seen. The cardiac shadow is within normal limits. No pneumothorax or sizable infiltrate is seen. Free air air is noted beneath the hemidiaphragms consistent with the recent gastrostomy placement it is relatively stable from the prior exam.  IMPRESSION: Postoperative change as described. Persistent free air is noted consistent with the recent gastrostomy placement. This finding was described on the previous exam.   Electronically Signed   By: Inez Catalina M.D.   On: 05/19/2014 09:58   Nm Pet Image Initial (pi) Skull Base To Thigh  04/26/2014   CLINICAL DATA:  Initial treatment strategy for oropharyngeal cancer.  EXAM: NUCLEAR MEDICINE PET SKULL BASE TO THIGH  TECHNIQUE: 8.0 mCi F-18 FDG was injected intravenously. Full-ring PET imaging was performed from the skull base to thigh after the radiotracer. CT data was obtained and used for attenuation correction and anatomic localization.  FASTING BLOOD GLUCOSE:  Value: 96 mg/dl  COMPARISON:  CT  neck 04/14/2014.  FINDINGS: NECK  A hypopharyngeal mass involves the epiglottis and is better seen and measured on diagnostic examination performed 04/14/2014. It has a corresponding SUV max of 21.6. There are bilateral hypermetabolic level 2 lymph nodes with index lymph node on the right measuring 8 mm in short axis (CT image 51) with a corresponding SUV max of 5.3. Left level 3 lymph nodes measure up to 7 mm in short axis (image 61) with an SUV max of 4.6.  CT images show no acute findings. Paranasal sinuses and mastoid air cells are clear.  CHEST  No hypermetabolic mediastinal, hilar or axillary lymph nodes. No hypermetabolic pulmonary nodules.  CT images show normal heart size. No pericardial or pleural effusion.  ABDOMEN/PELVIS  No abnormal hypermetabolism in the liver, adrenal glands, spleen or pancreas. No hypermetabolic lymph nodes.  CT images to the liver, gallbladder, adrenal glands, kidneys, spleen, pancreas, stomach and bowel to be grossly unremarkable. Atherosclerotic calcification of the arterial vasculature without abdominal aortic aneurysm. No free fluid.  SKELETON  No abnormal osseous hypermetabolism.  IMPRESSION: Hypermetabolic hypopharyngeal mass with hypermetabolic lymph nodes in the neck bilaterally. No evidence of distant metastatic disease.   Electronically Signed   By: Lorin Picket M.D.   On: 04/26/2014 14:55   Dg Chest Port 1 View  05/18/2014   CLINICAL DATA:  Port-A-Cath placement  EXAM: PORTABLE CHEST - 1 VIEW  COMPARISON:  04/14/2014  FINDINGS: Cardiomediastinal silhouette is stable. Tracheostomy tube in place. No acute infiltrate or pleural effusion. No pulmonary edema. There is a right subclavian Port-A-Cath with tip in right atrium. No pneumothorax. Small amount of free air under right hemidiaphragm probable postsurgical in nature. Clinical correlation is necessary.  IMPRESSION: Tracheostomy tube in place. No acute infiltrate or pleural effusion. No pulmonary edema. There is a  right subclavian Port-A-Cath with tip in right atrium. No pneumothorax. Small amount of free air under right hemidiaphragm probable postsurgical in nature. Clinical correlation is necessary.   Electronically Signed   By: Lahoma Crocker M.D.   On: 05/18/2014 12:40  Dg Swallowing Func-speech Pathology  04/27/2014   Macario Golds, CCC-SLP     04/27/2014 12:25 PM Objective Swallowing Evaluation: Modified Barium Swallowing Study   Patient Details  Name: Stacey Carroll MRN: [id number] Date of Birth: 1961/07/27  Today's Date: 04/27/2014 Time: 1007-1050 SLP Time Calculation (min): 43 min  Past Medical History:  Past Medical History  Diagnosis Date  . Visual disturbance 03/11/2013  . Dyslipidemia   . Headache(784.0)   . Depression   . Endometriosis   . Hypertension   . Hypercholesterolemia   . Anxiety   . Scoliosis   . Arthritis   . Throat pain 04/19/2014  . Dysphagia 04/27/2014   Past Surgical History:  Past Surgical History  Procedure Laterality Date  . Cervical laminectomy  2007 (?), 2011  . Shoulder surgery Left   . Abdominal hysterectomy  1987  . 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  Weight loss, dysphagia, throat pain noted x several  months.    Plans are for chemoradiation per MD office note.  Pt  PMH + for tobacco use, DJD of cervical spine s/p ACDF, HA,  dyslipidemia, anxiety, scoliosis, depression, endometriosis,  arthritis, HTN, hypercholesterolemia.  PSH cervical laminectomy -  ? 2007 and 2011, ? ACDF C4-C6 ? 2004.  Marland Kitchen  Pt for swallow  evaluation as she reports dysphagia x several months requiring  her to subside off of liquids.  CXR 7/10 negative.       Assessment / Plan / Recommendation Clinical Impression  Dysphagia  Diagnosis: Severe pharyngeal phase dysphagia;Moderate  pharyngeal phase dysphagia Clinical impression:   Moderately severe pharyngeal phase dysphagia due to vallecular  tumor preventing adequate epiglottic deflection resulting in  stasis and mild amount of laryngeal penetration.  Pt conducted  multiple swallows to help clear stasis.  Pt demonstrates adequate  sensation to moderate stasis but not to trace amount.  She was  able to "hock" and expectorate mild amounts that she did not  swallow.     Due to pt's cervical spine disease and previous surgeries,  available compensation postures were limited.  Minimal head turn  right/left did not decrease stasis.  SLP only tested thin and  nectar consistencies due to amount of stasis and aspiration risk.   Super supraglottic swallow reviewed, demonstrated with return  demonstration under flour for use as indicated as pt has  decreased laryngeal elevation.   Pt noted to cough a few times  during testing but did not appear with aspiration and trace  laryngeal penetration only.     At completion of testing when pt was sitting in regular chair,  she then reported she elevates her chin to help with clearance -  did not assess this under flouro as pt did not mention it  previously when asked and was concerned re: DDD.    Discussion re: thin liquid supplements *eg Boost Breeze* for ease  of swallow.  Also discussed importance of pt continuing to try to  consume - even water - to prevent disuse muscle atrophy.   Xerostomia noted for which slp provided written and verbal  compensations.   Pt reports plan to have feeding tube placed for  nutrition and SLP  agrees that pt's current level of dysphagia  likely impairs ability to maintain nutrition.       Treatment Recommendation    follow up SLP   Diet Recommendation Thin liquid   Medication Administration: Other (Comment) (liquid form for  medication) Supervision: Patient able to self feed Compensations: Slow rate;Small  sips/bites;Multiple dry swallows  after each bite/sip ("hock" and expectorate at end of meal) Postural Changes and/or Swallow Maneuvers: Seated upright 90  degrees;Upright 30-60 min after meal    Other  Recommendations Oral Care Recommendations: Oral care BID  (xerostomia compensations reviewed)     General Date of Onset: 04/27/14 HPI: 53 yo female recently diagnosed with squamous cell carcinoma  of the vallecula and lingual surface of epiglottis diagnosed July  2015.  Weight loss, dysphagia, throat pain noted x several  months.    Plans are for chemoradiation per MD office note.  Pt  PMH + for tobacco use, DJD of cervical spine s/p ACDF, HA,  dyslipidemia, anxiety, scoliosis, depression, endometriosis,  arthritis, HTN, hypercholesterolemia.  PSH cervical laminectomy -  ? 2007 and 2011, ? ACDF C4-C6 ? 2004.  Marland Kitchen  Pt for swallow  evaluation as she reports dysphagia x several months requiring  her to subside off of liquids.  CXR 7/10 negative.   Type of Study: Modified Barium Swallowing Study Reason for Referral: Objectively evaluate swallowing function Diet Prior to this Study: Thin liquids Temperature Spikes Noted: No Respiratory Status: Room air History of Recent Intubation: No Behavior/Cognition: Alert;Cooperative;Pleasant mood Oral Cavity - Dentition: Adequate natural dentition Oral Motor / Sensory Function: Within functional limits Self-Feeding Abilities: Able to feed self Patient Positioning: Upright in bed Baseline Vocal Quality: Clear Volitional Cough: Strong Volitional Swallow: Able to elicit (poor laryngeal elevation) Anatomy: Other (Comment) (pt has a vallecular tumor )    Reason for Referral Objectively evaluate swallowing function   Oral Phase Oral Preparation/Oral Phase Oral Phase: WFL Oral - Nectar Oral - Nectar Cup: Within functional limits Oral - Thin Oral - Thin Teaspoon: Within functional limits Oral - Thin Cup: Within functional limits Oral - Thin Straw: Within functional limits   Pharyngeal Phase  Pharyngeal - Nectar Pharyngeal - Nectar Cup: Reduced epiglottic inversion;Reduced  anterior laryngeal mobility;Reduced laryngeal elevation;Reduced  airway/laryngeal closure;Pharyngeal residue - valleculae;Reduced  tongue base retraction Pharyngeal - Thin Pharyngeal - Thin Teaspoon: Reduced epiglottic inversion;Reduced  anterior laryngeal mobility;Reduced laryngeal elevation;Reduced  airway/laryngeal closure;Pharyngeal residue - valleculae;Reduced  tongue base retraction;Penetration/Aspiration during swallow Penetration/Aspiration details (thin teaspoon): Material enters  airway, CONTACTS cords then ejected out Pharyngeal - Thin Cup: Reduced epiglottic inversion;Reduced  anterior laryngeal mobility;Reduced laryngeal elevation;Reduced  airway/laryngeal closure;Pharyngeal residue - valleculae;Reduced  tongue base retraction Pharyngeal - Thin Straw: Reduced epiglottic inversion;Reduced  anterior laryngeal mobility;Reduced laryngeal elevation;Reduced  airway/laryngeal closure;Pharyngeal residue - valleculae;Reduced  tongue base retraction  Cervical Esophageal Phase    GO    Cervical Esophageal Phase Cervical Esophageal Phase: Impaired Cervical Esophageal Phase - Comment Cervical Esophageal Comment: appearance of cervical hardware -  C2-T1, appears to be separate plates, trace amount of liquid  residuals at lower part of cervical esophagus, esophagus appears  widened below area of hardware - radiologist not present to  confirm    Functional Assessment Tool Used: clinical judgement Functional Limitations: Swallowing Swallow Current Status (C9470): At least 60 percent but less than  80 percent impaired, limited or restricted Swallow Goal Status [phone]): At least 60 percent but less than 80  percent impaired, limited or restricted Swallow Discharge Status [phone]): At least  60 percent but less  than 80 percent impaired, limited or restricted    Luanna Salk, [facility] SLP [phone]    Dg Fluoro Guide Cv Line-no  Report  05/18/2014   CLINICAL DATA: power port placement   FLOURO GUIDE CV LINE  Fluoroscopy was utilized by the requesting physician.  No radiographic  interpretation.     Microbiology: Recent Results (from the past 240 hour(s))  MRSA PCR SCREENING     Status: None   Collection Time    05/18/14  2:14 PM      Result Value Ref Range Status   MRSA by PCR NEGATIVE  NEGATIVE Final   Comment:            The GeneXpert MRSA Assay (FDA     approved for NASAL specimens     only), is one component of a     comprehensive MRSA colonization     surveillance program. It is not     intended to diagnose MRSA     infection nor to guide or     monitor treatment for     MRSA infections.     Labs: Basic Metabolic Panel:  Recent Labs Lab 05/18/14 0831 05/20/14 0314 05/21/14 0335 05/22/14 0310 05/23/14 0430  NA 139 139 139 137 140  K 3.6* 4.2 2.8* 4.2 4.2  CL  --  101 101 102 100  CO2  --  23 26 28 31   GLUCOSE 71 125* 135* 149* 107*  BUN  --  6 7 10 13   CREATININE  --  0.63 0.46* 0.51 0.62  CALCIUM  --  9.6 9.0 9.1 9.7  MG  --   --  1.9  --   --    Liver Function Tests:  Recent Labs Lab 05/20/14 0314 05/22/14 0310  AST 17 12  ALT 9 9  ALKPHOS 100 77  BILITOT 0.4 0.2*  PROT 6.6 5.6*  ALBUMIN 2.8* 2.2*   No results found for this basename: LIPASE, AMYLASE,  in the last 168 hours No results found for this basename: AMMONIA,  in the last 168 hours CBC:  Recent Labs Lab 05/18/14 0831 05/18/14 1427 05/20/14 0314 05/21/14 0335 05/22/14 0310  WBC  --  22.0* 20.4* 14.7* 11.3*  HGB 14.3 14.4 13.4 12.0 11.2*  HCT 42.0 42.6 39.2 35.3* 33.1*  MCV  --  91.2 90.3 87.8 90.7  PLT  --  353 353 343 309   Cardiac Enzymes: No results found for this basename: CKTOTAL, CKMB, CKMBINDEX, TROPONINI,  in the last 168 hours BNP: BNP (last 3 results) No results found for this basename: PROBNP,  in the last 8760 hours CBG:  Recent Labs Lab 05/22/14 1641 05/22/14 1930 05/22/14 2351  05/23/14 0341 05/23/14 0800  GLUCAP 111* 134* 129* 125* 144*    Active Problems:   Oropharynx cancer   Protein-calorie malnutrition, severe   Time coordinating discharge: <30 mins  Signed:  Ariona Deschene, ANP-BC

## 2014-05-23 NOTE — Discharge Planning (Signed)
Patient discharged home in stable condition. Verbalizes understanding of all discharge instructions, including home medications and follow up appointments. 

## 2014-05-23 NOTE — Telephone Encounter (Signed)
Spoke with patient's son, Marden Noble.  He stated his mom arrived home around noon today.  They are presently waiting for the [facility] RN to arrive to further address continuous feed system.  He stated he would bring the pump and supplement tomorrow so she can receive feeding during her 7-hr infusion.  I indicated I would join them for the start of her RT at 8:20.  He verbalized understanding.  Gayleen Orem, RN, BSN, [place] [facility] [place] [phone]

## 2014-05-23 NOTE — Telephone Encounter (Signed)
Note opened in error.

## 2014-05-23 NOTE — Progress Notes (Signed)
RT demonstrated trach care and suctioning for pt and her son. Son did not want to demonstrate back for me but stated he could do it if he needed to at home, Pt and son do not have any more questions for RT at this time however were told to call if anything arose. Rt will continue to monitor.

## 2014-05-23 NOTE — Telephone Encounter (Signed)
Per staff message from MD I have scheduled appts 

## 2014-05-24 ENCOUNTER — Ambulatory Visit: Payer: BC Managed Care – PPO | Admitting: Nutrition

## 2014-05-24 ENCOUNTER — Ambulatory Visit (HOSPITAL_BASED_OUTPATIENT_CLINIC_OR_DEPARTMENT_OTHER): Payer: BC Managed Care – PPO | Admitting: Hematology and Oncology

## 2014-05-24 ENCOUNTER — Ambulatory Visit (HOSPITAL_BASED_OUTPATIENT_CLINIC_OR_DEPARTMENT_OTHER): Payer: BC Managed Care – PPO

## 2014-05-24 ENCOUNTER — Ambulatory Visit: Payer: BC Managed Care – PPO

## 2014-05-24 ENCOUNTER — Encounter: Payer: Self-pay | Admitting: Radiation Oncology

## 2014-05-24 ENCOUNTER — Telehealth: Payer: Self-pay | Admitting: Hematology and Oncology

## 2014-05-24 ENCOUNTER — Other Ambulatory Visit: Payer: Self-pay | Admitting: Hematology and Oncology

## 2014-05-24 ENCOUNTER — Ambulatory Visit
Admission: RE | Admit: 2014-05-24 | Discharge: 2014-05-24 | Disposition: A | Discharge status: Home / Self Care | Payer: BC Managed Care – PPO | Source: Ambulatory Visit | Attending: Radiation Oncology | Admitting: Radiation Oncology

## 2014-05-24 ENCOUNTER — Encounter: Payer: Self-pay | Admitting: Hematology and Oncology

## 2014-05-24 ENCOUNTER — Encounter: Payer: Self-pay | Admitting: *Deleted

## 2014-05-24 VITALS — BP 118/81 | HR 128 | Temp 98.1°F | Resp 18 | Ht 65.0 in | Wt 120.2 lb

## 2014-05-24 DIAGNOSIS — C100 Malignant neoplasm of vallecula: Secondary | ICD-10-CM | POA: Diagnosis not present

## 2014-05-24 DIAGNOSIS — C109 Malignant neoplasm of oropharynx, unspecified: Secondary | ICD-10-CM

## 2014-05-24 DIAGNOSIS — IMO0002 Reserved for concepts with insufficient information to code with codable children: Secondary | ICD-10-CM | POA: Diagnosis not present

## 2014-05-24 DIAGNOSIS — Z431 Encounter for attention to gastrostomy: Secondary | ICD-10-CM | POA: Diagnosis not present

## 2014-05-24 DIAGNOSIS — Z930 Tracheostomy status: Secondary | ICD-10-CM | POA: Diagnosis not present

## 2014-05-24 DIAGNOSIS — Z931 Gastrostomy status: Secondary | ICD-10-CM

## 2014-05-24 DIAGNOSIS — R1310 Dysphagia, unspecified: Secondary | ICD-10-CM | POA: Diagnosis not present

## 2014-05-24 DIAGNOSIS — Z5111 Encounter for antineoplastic chemotherapy: Secondary | ICD-10-CM

## 2014-05-24 DIAGNOSIS — F17200 Nicotine dependence, unspecified, uncomplicated: Secondary | ICD-10-CM | POA: Diagnosis not present

## 2014-05-24 DIAGNOSIS — Z510 Encounter for antineoplastic radiation therapy: Secondary | ICD-10-CM | POA: Diagnosis present

## 2014-05-24 DIAGNOSIS — Z90710 Acquired absence of both cervix and uterus: Secondary | ICD-10-CM | POA: Diagnosis not present

## 2014-05-24 DIAGNOSIS — R070 Pain in throat: Secondary | ICD-10-CM

## 2014-05-24 DIAGNOSIS — E43 Unspecified severe protein-calorie malnutrition: Secondary | ICD-10-CM

## 2014-05-24 DIAGNOSIS — F411 Generalized anxiety disorder: Secondary | ICD-10-CM | POA: Diagnosis not present

## 2014-05-24 MED ORDER — CISPLATIN CHEMO INJECTION 100MG/100ML
100.0000 mg/m2 | Freq: Once | INTRAVENOUS | Status: AC
  Administered 2014-05-24: 160 mg via INTRAVENOUS
  Filled 2014-05-24: qty 160

## 2014-05-24 MED ORDER — DEXAMETHASONE SODIUM PHOSPHATE 20 MG/5ML IJ SOLN
12.0000 mg | Freq: Once | INTRAMUSCULAR | Status: AC
  Administered 2014-05-24: 12 mg via INTRAVENOUS

## 2014-05-24 MED ORDER — SODIUM CHLORIDE 0.9 % IV SOLN
Freq: Once | INTRAVENOUS | Status: AC
  Administered 2014-05-24: 10:00:00 via INTRAVENOUS

## 2014-05-24 MED ORDER — HEPARIN SOD (PORK) LOCK FLUSH 100 UNIT/ML IV SOLN
500.0000 [IU] | Freq: Once | INTRAVENOUS | Status: AC | PRN
  Administered 2014-05-24: 500 [IU]
  Filled 2014-05-24: qty 5

## 2014-05-24 MED ORDER — SODIUM CHLORIDE 0.9 % IJ SOLN
10.0000 mL | INTRAMUSCULAR | Status: DC | PRN
  Administered 2014-05-24: 10 mL
  Filled 2014-05-24: qty 10

## 2014-05-24 MED ORDER — SODIUM CHLORIDE 0.9 % IV SOLN
150.0000 mg | Freq: Once | INTRAVENOUS | Status: AC
  Administered 2014-05-24: 150 mg via INTRAVENOUS
  Filled 2014-05-24: qty 5

## 2014-05-24 MED ORDER — PALONOSETRON HCL INJECTION 0.25 MG/5ML
0.2500 mg | Freq: Once | INTRAVENOUS | Status: AC
  Administered 2014-05-24: 0.25 mg via INTRAVENOUS

## 2014-05-24 MED ORDER — POTASSIUM CHLORIDE 2 MEQ/ML IV SOLN
Freq: Once | INTRAVENOUS | Status: AC
  Administered 2014-05-24: 11:00:00 via INTRAVENOUS
  Filled 2014-05-24: qty 10

## 2014-05-24 MED ORDER — LIDOCAINE-PRILOCAINE 2.5-2.5 % EX CREA
TOPICAL_CREAM | Freq: Once | CUTANEOUS | Status: AC
  Administered 2014-05-24: 1 via TOPICAL
  Filled 2014-05-24: qty 5

## 2014-05-24 MED ORDER — FENTANYL 25 MCG/HR TD PT72: 25.0000 ug | MEDICATED_PATCH | TRANSDERMAL | Status: DC

## 2014-05-24 NOTE — Progress Notes (Signed)
Chart note: The patient underwent Tomotherapy segmentation for treatment to her pharynx and neck. She is being treated to 9.9 delivered field widths corresponding to one set of IMRT treatment devices [phone]).

## 2014-05-24 NOTE — Patient Instructions (Addendum)
[facility] Discharge Instructions for Patients Receiving Chemotherapy  Today you received the following chemotherapy agents Cisplatin.  To help prevent nausea and vomiting after your treatment, we encourage you to take your nausea medication Zofran every 8 hours as needed.   If you develop nausea and vomiting that is not controlled by your nausea medication, call the clinic.   BELOW ARE SYMPTOMS THAT SHOULD BE REPORTED IMMEDIATELY:  *FEVER GREATER THAN 100.5 F  *CHILLS WITH OR WITHOUT FEVER  NAUSEA AND VOMITING THAT IS NOT CONTROLLED WITH YOUR NAUSEA MEDICATION  *UNUSUAL SHORTNESS OF BREATH  *UNUSUAL BRUISING OR BLEEDING  TENDERNESS IN MOUTH AND THROAT WITH OR WITHOUT PRESENCE OF ULCERS  *URINARY PROBLEMS  *BOWEL PROBLEMS  UNUSUAL RASH Items with * indicate a potential emergency and should be followed up as soon as possible.  Feel free to call the clinic you have any questions or concerns. The clinic phone number is [phone].

## 2014-05-24 NOTE — Assessment & Plan Note (Signed)
I recommend adding fentanyl patch and to increase prescription liquid morphine as needed.

## 2014-05-24 NOTE — Assessment & Plan Note (Signed)
Tracheostomy site looks fine without signs of infection.

## 2014-05-24 NOTE — Progress Notes (Signed)
Nutrition followup completed with patient during infusion.  Patient is receiving chemotherapy for oropharyngeal cancer.  Patient is status post trach and feeding tube August 13.  Patient reports she is unable to swallow at this time.  She is tolerating continuous tube feedings utilizing Jevity 1.2 at 30 mL an hour over 24 hours.  Patient has been receiving 30 cc of free water several times throughout the day.  She is concerned she is not getting enough free water.   Weight documented as 120.2 pounds on August 19.  Noted, glucose elevated at 144, potassium within normal range of 4.2.  Estimated nutrition needs: [phone], calories, 81-92 g protein, 1.8 L fluid.  Jevity 1.2 at goal rate of 65 mL an hour provides 1872 calories, 87 g protein.  Nutrition diagnosis: Unintended weight loss has improved.  Intervention: Patient educated to continue Jevity 1.2 at 30 mL an hour over 24 hours as tolerated.  Patient to increase by 10 mL q. 12 hours to goal rate of 65 mL an hour. Recommend continued monitoring of labs for receiving syndrome to include potassium, phosphorus, and magnesium. MD notified. Increase free water flushes to a minimum of 120 mL free water 5 times daily.  Total free water is approximately 1860 mL daily via tube. Oral intake as tolerated.  Monitoring, evaluation, goals: Patient will tolerate tube feeding advancement to promote weight maintenance/gain.  Next visit: Wednesday, September 2, during infusion. I can see patient sooner if needed.  **Disclaimer: This note was dictated with voice recognition software. Similar sounding words can inadvertently be transcribed and this note may contain transcription errors which may not have been corrected upon publication of note.**

## 2014-05-24 NOTE — Progress Notes (Signed)
[facility] OFFICE PROGRESS NOTE  Patient Care Team: Jonathon Bellows, MD as PCP - General (Family Medicine) Heather [place], [place] as Consulting Physician (Optometry) Brooks Sailors, RN as Oncology Nurse Navigator (Oncology)  SUMMARY OF ONCOLOGIC HISTORY: Oncology History   Carcinoma of vallecula epiglottica Oropharyngeal cancer   Primary site: Pharynx - Oropharynx   Staging method: AJCC 7th Edition   Clinical: Stage IVA (T3, N2c, M0) signed by Eppie Gibson, MD on 04/20/2014  3:33 PM   Summary: Stage IVA (T3, N2c, M0)       Oropharyngeal cancer   04/14/2014 Imaging CT scan of the neck showed oropharyngeal mass.   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   04/14/2014 Surgery Laryngoscopy of the lower tongue base, vallecula, and lingual surface of the epiglottis showed a fungating irregular mass   04/26/2014 Imaging PET/CT scan showed bilateral lymphadenopathy   04/27/2014 Imaging Barium swallow shows severe dysphagia.   05/18/2014 - 05/22/2014 Hospital Admission The patient was admitted to the hospital for tracheostomy, placement of port and feeding tube.    INTERVAL HISTORY: Please see below for problem oriented charting. She seemed prior to cycle 1 of treatment. She is struggling to use the feeding tube this morning. She complained of severe, uncontrolled throat pain.  REVIEW OF SYSTEMS:   Constitutional: Denies fevers, chills  Eyes: Denies blurriness of vision Respiratory: Denies cough, dyspnea or wheezes Cardiovascular: Denies palpitation, chest discomfort or lower extremity swelling Gastrointestinal:  Denies nausea, heartburn or change in bowel habits Skin: Denies abnormal skin rashes Lymphatics: Denies new lymphadenopathy or easy bruising Neurological:Denies numbness, tingling or new weaknesses Behavioral/Psych: Mood is stable, no new changes  All other systems were reviewed  with the patient and are negative.  I have reviewed the past medical history, past surgical history, social history and family history with the patient and they are unchanged from previous note.  ALLERGIES:  is allergic to penicillins.  MEDICATIONS:  Current Outpatient Prescriptions  Medication Sig Dispense Refill  . acetaminophen (TYLENOL) 160 MG/5ML solution Take 20.3 mLs (650 mg total) by mouth every 4 (four) hours as needed.  120 mL  0  . diazepam (VALIUM) 10 MG tablet Take 5 mg by mouth every 6 (six) hours as needed for anxiety.       . fluticasone (FLONASE) 50 MCG/ACT nasal spray Place 2 sprays into both nostrils daily as needed for allergies.       Marland Kitchen ipratropium-albuterol (DUONEB) 0.5-2.5 (3) MG/3ML SOLN Take 3 mLs by nebulization every 4 (four) hours as needed (for shortness of breath).       . lidocaine-prilocaine (EMLA) cream Apply 1 application topically as needed.  30 g  6  . morphine (ROXANOL) 20 MG/ML concentrated solution Take 1 mL (20 mg total) by mouth every 2 (two) hours as needed for severe pain.  120 mL  0  . Nutritional Supplements (FEEDING SUPPLEMENT, JEVITY 1.2 CAL,) LIQD Place 1,000 mLs into feeding tube continuous.  1000 mL  0  . paroxetine (PAXIL) 10 MG/5ML suspension Take 20 mg by mouth every morning.      . polyethylene glycol (MIRALAX / GLYCOLAX) packet Take 17 g by mouth daily.  14 each  0  . sennosides (SENOKOT) 8.8 MG/5ML syrup Take 10 mLs by mouth at bedtime as needed for mild constipation.  237 mL  0  . fentaNYL (DURAGESIC - DOSED MCG/HR) 25 MCG/HR patch Place 1 patch (25  mcg total) onto the skin every 3 (three) days.  5 patch  0  . ondansetron (ZOFRAN) 8 MG tablet Take 1 tablet (8 mg total) by mouth every 8 (eight) hours as needed for nausea.  60 tablet  1  . ondansetron (ZOFRAN-ODT) 4 MG disintegrating tablet Take 1-2 tablets (4-8 mg total) by mouth every 8 (eight) hours as needed for nausea or vomiting.  60 tablet  0  . prochlorperazine (COMPAZINE) 10 MG  tablet Take 1 tablet (10 mg total) by mouth every 6 (six) hours as needed (Nausea or vomiting).  60 tablet  1  . sodium fluoride (FLUORISHIELD) 1.1 % GEL dental gel Instill one drop of fluoride per tooth space of fluoride tray. Place over teeth for 5 minutes. Remove. Spit out excess. Repeat nightly.  120 mL  prn   No current facility-administered medications for this visit.    PHYSICAL EXAMINATION: ECOG PERFORMANCE STATUS: 2 - Symptomatic, <50% confined to bed  Filed Vitals:   05/24/14 0919  BP: 118/81  Pulse: 128  Temp: 98.1 F (36.7 C)  Resp: 18   Filed Weights   05/24/14 0919  Weight: 120 lb 3.2 oz (54.522 kg)    GENERAL:alert, no distress and comfortable. She looks thin and cachectic SKIN: skin color, texture, turgor are normal, no rashes or significant lesions EYES: normal, Conjunctiva are pink and non-injected, sclera clear OROPHARYNX:no exudate, no erythema and lips, buccal mucosa, and tongue normal  NECK: supple, thyroid normal size, non-tender, without nodularity. Tracheostomy site looks okay LUNGS: clear to auscultation and percussion with normal breathing effort HEART: regular rate & rhythm and no murmurs and no lower extremity edema ABDOMEN:abdomen soft, non-tender and normal bowel sounds Musculoskeletal:no cyanosis of digits and no clubbing  NEURO: alert & oriented x 3 with fluent speech, no focal motor/sensory deficits  LABORATORY DATA:  I have reviewed the data as listed    Component Value Date/Time   NA 140 05/23/2014 0430   NA 140 05/04/2014 1446   K 4.2 05/23/2014 0430   K 4.2 05/04/2014 1446   CL 100 05/23/2014 0430   CO2 31 05/23/2014 0430   CO2 28 05/04/2014 1446   GLUCOSE 107* 05/23/2014 0430   GLUCOSE 91 05/04/2014 1446   BUN 13 05/23/2014 0430   BUN 13.7 05/04/2014 1446   CREATININE 0.62 05/23/2014 0430   CREATININE 0.9 05/04/2014 1446   CALCIUM 9.7 05/23/2014 0430   CALCIUM 10.0 05/04/2014 1446   PROT 5.6* 05/22/2014 0310   PROT 7.4 05/04/2014 1446    ALBUMIN 2.2* 05/22/2014 0310   ALBUMIN 3.3* 05/04/2014 1446   AST 12 05/22/2014 0310   AST 13 05/04/2014 1446   ALT 9 05/22/2014 0310   ALT 7 05/04/2014 1446   ALKPHOS 77 05/22/2014 0310   ALKPHOS 106 05/04/2014 1446   BILITOT 0.2* 05/22/2014 0310   BILITOT 0.44 05/04/2014 1446   GFRNONAA >90 05/23/2014 0430   GFRAA >90 05/23/2014 0430    No results found for this basename: SPEP, UPEP,  kappa and lambda light chains    Lab Results  Component Value Date   WBC 11.3* 05/22/2014   NEUTROABS 7.4* 05/04/2014   HGB 11.2* 05/22/2014   HCT 33.1* 05/22/2014   MCV 90.7 05/22/2014   PLT 309 05/22/2014      Chemistry      Component Value Date/Time   NA 140 05/23/2014 0430   NA 140 05/04/2014 1446   K 4.2 05/23/2014 0430   K 4.2 05/04/2014 1446  CL 100 05/23/2014 0430   CO2 31 05/23/2014 0430   CO2 28 05/04/2014 1446   BUN 13 05/23/2014 0430   BUN 13.7 05/04/2014 1446   CREATININE 0.62 05/23/2014 0430   CREATININE 0.9 05/04/2014 1446      Component Value Date/Time   CALCIUM 9.7 05/23/2014 0430   CALCIUM 10.0 05/04/2014 1446   ALKPHOS 77 05/22/2014 0310   ALKPHOS 106 05/04/2014 1446   AST 12 05/22/2014 0310   AST 13 05/04/2014 1446   ALT 9 05/22/2014 0310   ALT 7 05/04/2014 1446   BILITOT 0.2* 05/22/2014 0310   BILITOT 0.44 05/04/2014 1446      ASSESSMENT & PLAN:  Oropharyngeal cancer We have discussed about starting her on treatment as soon as possible. The patient was consented from prior visit. I will continue supportive care weekly.  Throat pain I recommend adding fentanyl patch and to increase prescription liquid morphine as needed.    Protein-calorie malnutrition, severe We will start nutritional feeding through the feeding tube as soon as possible  S/P percutaneous endoscopic gastrostomy (PEG) tube placement The feeding tube looks fine with no infection  Tracheostomy status Tracheostomy site looks fine without signs of infection.   No orders of the defined types were placed in this  encounter.   All questions were answered. The patient knows to call the clinic with any problems, questions or concerns. No barriers to learning was detected. I spent 40 minutes counseling the patient face to face. The total time spent in the appointment was 55 minutes and more than 50% was on counseling and review of test results     [facility], [place], MD 05/24/2014 9:43 AM

## 2014-05-24 NOTE — Progress Notes (Signed)
Weekly Management Note:  Site: Oropharynx/vallecula/neck Current Dose:  200  cGy Projected Dose: 7000  cGy  Narrative: The patient is seen today for routine under treatment assessment. CBCT/MVCT images/port films were reviewed. The chart was reviewed.   The patient is seen today prior to her first treatment. She is discharged from the hospital yesterday and is beginning her chemoradiation today. We cut out a portion of the mass along the neck/trach site. Her setup is excellent. I spoke with her son, [name], who is obviously quite anxious. He is apparently on anxiety medication.  Physical Examination: There were no vitals filed for this visit..  Weight:  . Her trach site is clean. She is able to clear her secretions.  Impression: Tolerating radiation therapy well. Chemotherapy starts this morning.  Plan: Continue radiation therapy as planned.

## 2014-05-24 NOTE — Assessment & Plan Note (Signed)
We have discussed about starting her on treatment as soon as possible. The patient was consented from prior visit. I will continue supportive care weekly.

## 2014-05-24 NOTE — Assessment & Plan Note (Signed)
We will start nutritional feeding through the feeding tube as soon as possible

## 2014-05-24 NOTE — Assessment & Plan Note (Signed)
The feeding tube looks fine with no infection

## 2014-05-24 NOTE — Telephone Encounter (Signed)
gv adn printed appt sched and avs for pt for Aug..per MD verbal appt on 8.26 @ 9am

## 2014-05-25 ENCOUNTER — Telehealth: Payer: Self-pay | Admitting: *Deleted

## 2014-05-25 ENCOUNTER — Ambulatory Visit
Admission: RE | Admit: 2014-05-25 | Discharge: 2014-05-25 | Disposition: A | Discharge status: Home / Self Care | Payer: BC Managed Care – PPO | Source: Ambulatory Visit | Attending: Radiation Oncology | Admitting: Radiation Oncology

## 2014-05-25 ENCOUNTER — Ambulatory Visit (INDEPENDENT_AMBULATORY_CARE_PROVIDER_SITE_OTHER): Payer: BC Managed Care – PPO | Admitting: Surgery

## 2014-05-25 DIAGNOSIS — Z510 Encounter for antineoplastic radiation therapy: Secondary | ICD-10-CM | POA: Diagnosis not present

## 2014-05-25 NOTE — Telephone Encounter (Signed)
Called pt on cell phone and left message on voice mail requesting a call back for post chemo follow up.

## 2014-05-25 NOTE — Telephone Encounter (Signed)
PT.'S SON, DOUGLAS EZELL, CALLED. PT. HAS TOLERATED HER TUBE FEEDING. NO NAUSEA OR VOMITING. PT. HAD A GOOD BOWEL MOVEMENT ON 05/24/14. NO DIARRHEA. NO MOUTH ISSUES. PT. HAS A TRACH. SHE IS ABLE TO COUGH UP SECRETIONS. NO PROBLEMS BREATHING. NO QUESTIONS OR PROBLEMS AT THIS TIME. THE SON WILL CALL THIS OFFICE OR THE ON CALL PHYSICIAN IF THE NEED ARISES.

## 2014-05-26 ENCOUNTER — Ambulatory Visit
Admission: RE | Admit: 2014-05-26 | Discharge: 2014-05-26 | Disposition: A | Discharge status: Home / Self Care | Payer: BC Managed Care – PPO | Source: Ambulatory Visit | Attending: Radiation Oncology | Admitting: Radiation Oncology

## 2014-05-26 ENCOUNTER — Encounter: Payer: Self-pay | Admitting: *Deleted

## 2014-05-26 DIAGNOSIS — Z510 Encounter for antineoplastic radiation therapy: Secondary | ICD-10-CM | POA: Diagnosis not present

## 2014-05-26 NOTE — Progress Notes (Signed)
Met with patient and her son during appt with Dr. Alvy Bimler, provided son support during his adjustment of new tubing for continuous feed.  Escorted them to Infusion for her 1st chemotherapy.  Checked on her status later, she was doing well.  Gayleen Orem, RN, BSN, [place] at [place] [phone]

## 2014-05-26 NOTE — Progress Notes (Signed)
Met with Stacey Carroll and her son dring daily Tomo tmt.  Advised them that distilled water acceptable to use for daily hydration via PEG if sterile water not available.  Stacey Carroll reported she was feeling well, encouraged that she completed 3 days of RT and first round of chemo this week.  Subjectively, she presented with a positive attitude, was smiling.  I recognized their accomplishments over the past couple of weeks, especially the learning of skills for trach and PEG maintenance, use of continuous feed system.  Continuing navigation as L1 patient (new patient).  Rick Diehl, RN, BSN, CHPN Head & Neck Oncology Navigator [facility] at [place] [phone]  

## 2014-05-29 ENCOUNTER — Encounter: Payer: Self-pay | Admitting: Radiation Oncology

## 2014-05-29 ENCOUNTER — Ambulatory Visit
Admission: RE | Admit: 2014-05-29 | Discharge: 2014-05-29 | Disposition: A | Discharge status: Home / Self Care | Payer: BC Managed Care – PPO | Source: Ambulatory Visit | Attending: Radiation Oncology | Admitting: Radiation Oncology

## 2014-05-29 ENCOUNTER — Telehealth (INDEPENDENT_AMBULATORY_CARE_PROVIDER_SITE_OTHER): Payer: Self-pay

## 2014-05-29 ENCOUNTER — Telehealth: Payer: Self-pay | Admitting: *Deleted

## 2014-05-29 VITALS — BP 118/95 | HR 97 | Temp 97.8°F | Ht 65.0 in | Wt 117.6 lb

## 2014-05-29 DIAGNOSIS — Z510 Encounter for antineoplastic radiation therapy: Secondary | ICD-10-CM | POA: Diagnosis not present

## 2014-05-29 DIAGNOSIS — C100 Malignant neoplasm of vallecula: Secondary | ICD-10-CM

## 2014-05-29 NOTE — Progress Notes (Signed)
Stacey Carroll has received 4 fractions to her Vallecula and Bilateral Neck.  She is c/o of fatigue today, but denies pain.  She and her son are becoming more comfortable with trach care and instructed today on how to remove the inner cannula, without them taking out the entire trach out as patient has been doing.  Trach site clean today.  Skin intact.

## 2014-05-29 NOTE — Telephone Encounter (Signed)
Rec'd call from patient's son at 60 stating his mother wasn't feeling well and wd not be able to make her 0820 appt.  In context of this repeat event, son recognized that morning appts are optimal and that appts later in the day would be better.  I notified Tomo of NS for morning tmt, notified son/patient of 1:20 reschedule, and asked Tomo to reschedule future appts for late morning/early afternoon which they did.  I will provide an updated schedule to patient later today.  Continuing navigation as L1 patient (new patient).  Gayleen Orem, RN, BSN, [place] at [place] [phone]

## 2014-05-29 NOTE — Telephone Encounter (Signed)
LMOM stating that pt only needs her nurse only visit that she does not need a post op appt per Dr Donne Hazel.

## 2014-05-29 NOTE — Progress Notes (Signed)
   Weekly Management Note:  outpatient    ICD-9-CM ICD-10-CM  1. Carcinoma of vallecula epiglottica 146.3 C10.0    Current Dose:  8 Gy  Projected Dose: 70 Gy   Narrative:  The patient presents for routine under treatment assessment.  CBCT/MVCT images/Port film x-rays were reviewed.  The chart was checked. She is c/o of fatigue today, but denies pain. She and her son are becoming more comfortable with trach care and instructed today on how to remove the inner cannula, without them taking out the entire trach out as patient has been doing.   Physical Findings:  height is 5\' 5"  (1.651 m) and weight is 117 lb 9.6 oz (53.343 kg). Her temperature is 97.8 F (36.6 C). Her blood pressure is 118/95 and her pulse is 97.   Oral mucosa intact, moist, no thrush. Trach site clean today. Skin intact.   CBC    Component Value Date/Time   WBC 11.3* 05/22/2014 0310   WBC 11.0* 05/04/2014 1446   RBC 3.65* 05/22/2014 0310   RBC 4.48 05/04/2014 1446   HGB 11.2* 05/22/2014 0310   HGB 14.2 05/04/2014 1446   HCT 33.1* 05/22/2014 0310   HCT 41.2 05/04/2014 1446   PLT 309 05/22/2014 0310   PLT 329 05/04/2014 1446   MCV 90.7 05/22/2014 0310   MCV 92.0 05/04/2014 1446   MCH 30.7 05/22/2014 0310   MCH 31.7 05/04/2014 1446   MCHC 33.8 05/22/2014 0310   MCHC 34.5 05/04/2014 1446   RDW 13.4 05/22/2014 0310   RDW 13.2 05/04/2014 1446   LYMPHSABS 2.4 05/04/2014 1446   LYMPHSABS 2.9 04/14/2014 1020   MONOABS 0.9 05/04/2014 1446   MONOABS 1.6* 04/14/2014 1020   EOSABS 0.3 05/04/2014 1446   EOSABS 0.2 04/14/2014 1020   BASOSABS 0.0 05/04/2014 1446   BASOSABS 0.1 04/14/2014 1020     CMP     Component Value Date/Time   NA 140 05/23/2014 0430   NA 140 05/04/2014 1446   K 4.2 05/23/2014 0430   K 4.2 05/04/2014 1446   CL 100 05/23/2014 0430   CO2 31 05/23/2014 0430   CO2 28 05/04/2014 1446   GLUCOSE 107* 05/23/2014 0430   GLUCOSE 91 05/04/2014 1446   BUN 13 05/23/2014 0430   BUN 13.7 05/04/2014 1446   CREATININE 0.62 05/23/2014 0430     CREATININE 0.9 05/04/2014 1446   CALCIUM 9.7 05/23/2014 0430   CALCIUM 10.0 05/04/2014 1446   PROT 5.6* 05/22/2014 0310   PROT 7.4 05/04/2014 1446   ALBUMIN 2.2* 05/22/2014 0310   ALBUMIN 3.3* 05/04/2014 1446   AST 12 05/22/2014 0310   AST 13 05/04/2014 1446   ALT 9 05/22/2014 0310   ALT 7 05/04/2014 1446   ALKPHOS 77 05/22/2014 0310   ALKPHOS 106 05/04/2014 1446   BILITOT 0.2* 05/22/2014 0310   BILITOT 0.44 05/04/2014 1446   GFRNONAA >90 05/23/2014 0430   GFRAA >90 05/23/2014 0430     Impression:  The patient is tolerating radiotherapy.   Plan:  Continue radiotherapy as planned. Patient's tube feeding rate is not quite at goal. Encouraged PO to supplement as tolerated, and gradual increase  in PEG feedings. To be assessed for IVF in a couple days in med/onc. Gayleen Orem, RN, our Head and Neck Oncology Navigator will re-refer to Garald Balding for missed SLP consult.  -----------------------------------  Eppie Gibson, MD

## 2014-05-30 ENCOUNTER — Telehealth: Payer: Self-pay | Admitting: *Deleted

## 2014-05-30 ENCOUNTER — Ambulatory Visit
Admission: RE | Admit: 2014-05-30 | Discharge: 2014-05-30 | Disposition: A | Discharge status: Home / Self Care | Payer: BC Managed Care – PPO | Source: Ambulatory Visit | Attending: Radiation Oncology | Admitting: Radiation Oncology

## 2014-05-30 DIAGNOSIS — Z510 Encounter for antineoplastic radiation therapy: Secondary | ICD-10-CM | POA: Diagnosis not present

## 2014-05-30 NOTE — Telephone Encounter (Signed)
Spoke with patient's son, informed him that his mom's Tomo appt has been rescheduled from 1:20 pm to 8:40 am.  He verbalized understanding.  Gayleen Orem, RN, BSN, [place] at [place] [phone]

## 2014-05-30 NOTE — Telephone Encounter (Signed)
Notified patient's son that his mom's appt has been reschedule from 8:40 am to 1:00 pm.  Gayleen Orem, RN, BSN, [place] at [place] [phone]

## 2014-05-31 ENCOUNTER — Telehealth: Payer: Self-pay | Admitting: Hematology and Oncology

## 2014-05-31 ENCOUNTER — Ambulatory Visit: Payer: Self-pay

## 2014-05-31 ENCOUNTER — Ambulatory Visit (HOSPITAL_BASED_OUTPATIENT_CLINIC_OR_DEPARTMENT_OTHER): Payer: BC Managed Care – PPO | Admitting: Hematology and Oncology

## 2014-05-31 ENCOUNTER — Ambulatory Visit (HOSPITAL_COMMUNITY): Payer: Medicaid - Dental | Admitting: Dentistry

## 2014-05-31 ENCOUNTER — Ambulatory Visit (INDEPENDENT_AMBULATORY_CARE_PROVIDER_SITE_OTHER): Payer: BC Managed Care – PPO

## 2014-05-31 ENCOUNTER — Encounter: Payer: Self-pay | Admitting: Hematology and Oncology

## 2014-05-31 ENCOUNTER — Encounter (HOSPITAL_COMMUNITY): Payer: Self-pay | Admitting: Dentistry

## 2014-05-31 ENCOUNTER — Ambulatory Visit
Admission: RE | Admit: 2014-05-31 | Discharge: 2014-05-31 | Disposition: A | Discharge status: Home / Self Care | Payer: BC Managed Care – PPO | Source: Ambulatory Visit | Attending: Radiation Oncology | Admitting: Radiation Oncology

## 2014-05-31 VITALS — BP 134/86 | HR 80 | Temp 97.8°F | Wt 115.0 lb

## 2014-05-31 VITALS — BP 125/78 | HR 77 | Temp 97.7°F | Resp 18 | Ht 65.0 in | Wt 116.0 lb

## 2014-05-31 DIAGNOSIS — C100 Malignant neoplasm of vallecula: Secondary | ICD-10-CM

## 2014-05-31 DIAGNOSIS — K117 Disturbances of salivary secretion: Secondary | ICD-10-CM

## 2014-05-31 DIAGNOSIS — K036 Deposits [accretions] on teeth: Secondary | ICD-10-CM

## 2014-05-31 DIAGNOSIS — T148XXD Other injury of unspecified body region, subsequent encounter: Secondary | ICD-10-CM

## 2014-05-31 DIAGNOSIS — R1310 Dysphagia, unspecified: Secondary | ICD-10-CM

## 2014-05-31 DIAGNOSIS — Z930 Tracheostomy status: Secondary | ICD-10-CM

## 2014-05-31 DIAGNOSIS — Z931 Gastrostomy status: Secondary | ICD-10-CM

## 2014-05-31 DIAGNOSIS — E43 Unspecified severe protein-calorie malnutrition: Secondary | ICD-10-CM

## 2014-05-31 DIAGNOSIS — Z510 Encounter for antineoplastic radiation therapy: Secondary | ICD-10-CM | POA: Diagnosis not present

## 2014-05-31 DIAGNOSIS — R110 Nausea: Secondary | ICD-10-CM

## 2014-05-31 DIAGNOSIS — R070 Pain in throat: Secondary | ICD-10-CM

## 2014-05-31 DIAGNOSIS — C109 Malignant neoplasm of oropharynx, unspecified: Secondary | ICD-10-CM

## 2014-05-31 DIAGNOSIS — R682 Dry mouth, unspecified: Secondary | ICD-10-CM

## 2014-05-31 HISTORY — DX: Nausea: R11.0

## 2014-05-31 MED ORDER — METOCLOPRAMIDE HCL 10 MG PO TABS: 10.0000 mg | ORAL_TABLET | Freq: Three times a day (TID) | ORAL | Status: DC

## 2014-05-31 NOTE — Telephone Encounter (Signed)
gv adn printed appt sched and avs for pt for Aug adn Sept °

## 2014-05-31 NOTE — Assessment & Plan Note (Signed)
Her pain appears to be well-controlled with current pain medicine.

## 2014-05-31 NOTE — Progress Notes (Signed)
[facility] OFFICE PROGRESS NOTE  Patient Care Team: Jonathon Bellows, MD as PCP - General (Family Medicine) Heather [place], [place] as Consulting Physician (Optometry) Brooks Sailors, RN as Oncology Nurse Navigator (Oncology)  SUMMARY OF ONCOLOGIC HISTORY: Oncology History   Carcinoma of vallecula epiglottica Oropharyngeal cancer   Primary site: Pharynx - Oropharynx   Staging method: AJCC 7th Edition   Clinical: Stage IVA (T3, N2c, M0) signed by Eppie Gibson, MD on 04/20/2014  3:33 PM   Summary: Stage IVA (T3, N2c, M0)       Oropharyngeal cancer   04/14/2014 Imaging CT scan of the neck showed oropharyngeal mass.   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   04/14/2014 Surgery Laryngoscopy of the lower tongue base, vallecula, and lingual surface of the epiglottis showed a fungating irregular mass   04/26/2014 Imaging PET/CT scan showed bilateral lymphadenopathy   04/27/2014 Imaging Barium swallow shows severe dysphagia.   05/18/2014 - 05/22/2014 Hospital Admission The patient was admitted to the hospital for tracheostomy, placement of port and feeding tube.   05/24/2014 -  Chemotherapy She is started on high dose cisplatin   05/24/2014 -  Radiation Therapy She is started on radiation treatment    INTERVAL HISTORY: Please see below for problem oriented charting. The patient feels hungry. She has several episodes of vomiting the last 2 days. She denies any dizziness. She complained of feeling thirsty. Her throat pain appears to be under control.  REVIEW OF SYSTEMS:   Constitutional: Denies fevers, chills or abnormal weight loss Eyes: Denies blurriness of vision Ears, nose, mouth, throat, and face: Denies mucositis or sore throat Respiratory: Denies cough, dyspnea or wheezes Cardiovascular: Denies palpitation, chest discomfort or lower extremity swelling Skin: Denies abnormal skin  rashes Lymphatics: Denies new lymphadenopathy or easy bruising Neurological:Denies numbness, tingling or new weaknesses Behavioral/Psych: Mood is stable, no new changes  All other systems were reviewed with the patient and are negative.  I have reviewed the past medical history, past surgical history, social history and family history with the patient and they are unchanged from previous note.  ALLERGIES:  is allergic to penicillins.  MEDICATIONS:  Current Outpatient Prescriptions  Medication Sig Dispense Refill  . diazepam (VALIUM) 10 MG tablet Take 5 mg by mouth every 6 (six) hours as needed for anxiety.       . fentaNYL (DURAGESIC - DOSED MCG/HR) 25 MCG/HR patch Place 1 patch (25 mcg total) onto the skin every 3 (three) days.  5 patch  0  . fluticasone (FLONASE) 50 MCG/ACT nasal spray Place 2 sprays into both nostrils daily as needed for allergies.       Marland Kitchen ipratropium-albuterol (DUONEB) 0.5-2.5 (3) MG/3ML SOLN Take 3 mLs by nebulization every 4 (four) hours as needed (for shortness of breath).       . lidocaine-prilocaine (EMLA) cream Apply 1 application topically as needed.  30 g  6  . morphine (ROXANOL) 20 MG/ML concentrated solution Take 1 mL (20 mg total) by mouth every 2 (two) hours as needed for severe pain.  120 mL  0  . Nutritional Supplements (FEEDING SUPPLEMENT, JEVITY 1.2 CAL,) LIQD Place 1,000 mLs into feeding tube continuous.  1000 mL  0  . ondansetron (ZOFRAN) 8 MG tablet Take 1 tablet (8 mg total) by mouth every 8 (eight) hours as needed for nausea.  60 tablet  1  . ondansetron (ZOFRAN-ODT) 4 MG disintegrating tablet Take  1-2 tablets (4-8 mg total) by mouth every 8 (eight) hours as needed for nausea or vomiting.  60 tablet  0  . paroxetine (PAXIL) 10 MG/5ML suspension Take 20 mg by mouth every morning.      . polyethylene glycol (MIRALAX / GLYCOLAX) packet Take 17 g by mouth daily.  14 each  0  . prochlorperazine (COMPAZINE) 10 MG tablet Take 1 tablet (10 mg total) by mouth  every 6 (six) hours as needed (Nausea or vomiting).  60 tablet  1  . sennosides (SENOKOT) 8.8 MG/5ML syrup Take 10 mLs by mouth at bedtime as needed for mild constipation.  237 mL  0  . sodium fluoride (FLUORISHIELD) 1.1 % GEL dental gel Instill one drop of fluoride per tooth space of fluoride tray. Place over teeth for 5 minutes. Remove. Spit out excess. Repeat nightly.  120 mL  prn  . acetaminophen (TYLENOL) 160 MG/5ML solution Take 20.3 mLs (650 mg total) by mouth every 4 (four) hours as needed.  120 mL  0  . metoCLOPramide (REGLAN) 10 MG tablet Take 1 tablet (10 mg total) by mouth 4 (four) times daily -  before meals and at bedtime.  60 tablet  3   No current facility-administered medications for this visit.    PHYSICAL EXAMINATION: ECOG PERFORMANCE STATUS: 1 - Symptomatic but completely ambulatory  Filed Vitals:   05/31/14 0902  BP: 125/78  Pulse: 77  Temp: 97.7 F (36.5 C)  Resp: 18   Filed Weights   05/31/14 0902  Weight: 116 lb (52.617 kg)    GENERAL:alert, no distress and comfortable. She looks thin and cachectic SKIN: skin color, texture, turgor are normal, no rashes or significant lesions EYES: normal, Conjunctiva are pink and non-injected, sclera clear OROPHARYNX:no exudate, no erythema and lips, buccal mucosa, and tongue normal  NECK: supple, tracheostomy site looks okay.  LYMPH:  no palpable lymphadenopathy in the cervical, axillary or inguinal LUNGS: clear to auscultation and percussion with normal breathing effort HEART: regular rate & rhythm and no murmurs and no lower extremity edema ABDOMEN:abdomen soft, non-tender and normal bowel sounds. Feeding tube site looks okay Musculoskeletal:no cyanosis of digits and no clubbing  NEURO: alert & oriented x 3 with fluent speech, no focal motor/sensory deficits  LABORATORY DATA:  I have reviewed the data as listed    Component Value Date/Time   NA 140 05/23/2014 0430   NA 140 05/04/2014 1446   K 4.2 05/23/2014 0430    K 4.2 05/04/2014 1446   CL 100 05/23/2014 0430   CO2 31 05/23/2014 0430   CO2 28 05/04/2014 1446   GLUCOSE 107* 05/23/2014 0430   GLUCOSE 91 05/04/2014 1446   BUN 13 05/23/2014 0430   BUN 13.7 05/04/2014 1446   CREATININE 0.62 05/23/2014 0430   CREATININE 0.9 05/04/2014 1446   CALCIUM 9.7 05/23/2014 0430   CALCIUM 10.0 05/04/2014 1446   PROT 5.6* 05/22/2014 0310   PROT 7.4 05/04/2014 1446   ALBUMIN 2.2* 05/22/2014 0310   ALBUMIN 3.3* 05/04/2014 1446   AST 12 05/22/2014 0310   AST 13 05/04/2014 1446   ALT 9 05/22/2014 0310   ALT 7 05/04/2014 1446   ALKPHOS 77 05/22/2014 0310   ALKPHOS 106 05/04/2014 1446   BILITOT 0.2* 05/22/2014 0310   BILITOT 0.44 05/04/2014 1446   GFRNONAA >90 05/23/2014 0430   GFRAA >90 05/23/2014 0430    No results found for this basename: SPEP, UPEP,  kappa and lambda light chains    Lab  Results  Component Value Date   WBC 11.3* 05/22/2014   NEUTROABS 7.4* 05/04/2014   HGB 11.2* 05/22/2014   HCT 33.1* 05/22/2014   MCV 90.7 05/22/2014   PLT 309 05/22/2014      Chemistry      Component Value Date/Time   NA 140 05/23/2014 0430   NA 140 05/04/2014 1446   K 4.2 05/23/2014 0430   K 4.2 05/04/2014 1446   CL 100 05/23/2014 0430   CO2 31 05/23/2014 0430   CO2 28 05/04/2014 1446   BUN 13 05/23/2014 0430   BUN 13.7 05/04/2014 1446   CREATININE 0.62 05/23/2014 0430   CREATININE 0.9 05/04/2014 1446      Component Value Date/Time   CALCIUM 9.7 05/23/2014 0430   CALCIUM 10.0 05/04/2014 1446   ALKPHOS 77 05/22/2014 0310   ALKPHOS 106 05/04/2014 1446   AST 12 05/22/2014 0310   AST 13 05/04/2014 1446   ALT 9 05/22/2014 0310   ALT 7 05/04/2014 1446   BILITOT 0.2* 05/22/2014 0310   BILITOT 0.44 05/04/2014 1446      ASSESSMENT & PLAN:  Oropharyngeal cancer She tolerated treatment well apart from minor side effects. I will continue supportive care weekly.    Tracheostomy status Tracheostomy site looks fine without signs of infection.    S/P percutaneous endoscopic gastrostomy (PEG) tube  placement The feeding tube looks fine with no infection    Protein-calorie malnutrition, severe She looks  thin and mildly cachectic. She has several episodes of vomiting last few days. I recommend Reglan before each meal and to take food by mouth as tolerated.   Throat pain Her pain appears to be well-controlled with current pain medicine.  Nausea alone I recommend Reglan before meals and recommend IV fluids and anti-emetic for the rest of the week. She declined IV fluids today but will return tomorrow for IV fluids and anti-emetics. I recommended she discontinue Compazine due to interaction with Reglan   No orders of the defined types were placed in this encounter.   All questions were answered. The patient knows to call the clinic with any problems, questions or concerns. No barriers to learning was detected. I spent 25 minutes counseling the patient face to face. The total time spent in the appointment was 30 minutes and more than 50% was on counseling and review of test results     [facility], [place], MD 05/31/2014 2:23 PM

## 2014-05-31 NOTE — Assessment & Plan Note (Addendum)
I recommend Reglan before meals and recommend IV fluids and anti-emetic for the rest of the week. She declined IV fluids today but will return tomorrow for IV fluids and anti-emetics. I recommended she discontinue Compazine due to interaction with Reglan

## 2014-05-31 NOTE — Progress Notes (Signed)
05/31/2014  Patient:            Stacey Carroll Date of Birth:  1961-09-06 MRN:                [id number]  BP 134/86  Pulse 80  Temp(Src) 97.8 F (36.6 C) (Oral)  Wt 115 lb (52.164 kg)  KAMERIN AXFORD presents for oral examination during radiation therapy. Patient has completed 5/35 radiation treatments. Patient has had one cycle of chemotherapy. Patient had tracheostomy placed prior to surgical placement of the feeding tube.  REVIEW OF CHIEF COMPLAINTS:  DRY MOUTH: Yes HARD TO SWALLOW: Yes  HURT TO SWALLOW: Yes TASTE CHANGES: Patient unable to eat by mouth and therefore is not certain of the taste changes. SORES IN MOUTH: None TRISMUS: No problems with trismus WEIGHT: 115 pounds  HOME OH REGIMEN:  BRUSHING: Twice a day FLOSSING: Once daily if she can RINSING: Salt water and baking soda and Biotene rinses FLUORIDE: At bedtime TRISMUS EXERCISES:  Maximum interincisal opening: 40 mm. Trismus device refabricated with 24 sticks.   DENTAL EXAM:  Oral Hygiene:(PLAQUE): Relatively good oral hygiene. Oral hygiene improvement was highly suggested. LOCATION OF MUCOSITIS: None DESCRIPTION OF SALIVA: Decreased and foamy saliva. ANY EXPOSED BONE: None noted. OTHER WATCHED AREAS: Previous extraction site the lower left quadrant is healing in by secondary intention. This is much improved over the postop visit at one week. Patient to continue using salt water irrigation to this site twice a day. DX: Xerostomia, Dysphagia, Odynophagia, Weight Loss, Accretions and Delayed wound healing from lower left moderate extraction site  RECOMMENDATIONS: 1. Brush after meals and at bedtime.  Use fluoride at bedtime. Brush tongue daily. 2. Use trismus exercises as directed. 3. Use Biotene Rinse or salt water/baking soda rinses. Use Monoject syringe to assist in keeping the lower left extraction site free of oral debris. 4. Multiple sips of water as needed. 5. Return to clinic in two months for periodic  oral exam after chemoradiation therapy. Call if problems before then.  Lenn Cal, DDS

## 2014-05-31 NOTE — Progress Notes (Signed)
Pt came in for nurse only to have staples removed. Staples removed and steri strips applied. The incision looks really good. I advised pt that if she has any drainage with odor pt needs to call our office. I advised pt that she could get the steri strips wet with soap and water just pat the area dry when getting out of the shower. I advised pt that she didn't need to come back to the office unless if she had a problem with the incision since she is going to be having so many appt's with treatment. Pt understands.

## 2014-05-31 NOTE — Assessment & Plan Note (Signed)
She looks  thin and mildly cachectic. She has several episodes of vomiting last few days. I recommend Reglan before each meal and to take food by mouth as tolerated.

## 2014-05-31 NOTE — Patient Instructions (Signed)
RECOMMENDATIONS: 1. Brush after meals and at bedtime.  Use fluoride at bedtime. Brush tongue daily. 2. Use trismus exercises as directed. 3. Use Biotene Rinse or salt water/baking soda rinses. Use Monoject syringe to assist in keeping the lower left extraction site free of oral debris. 4. Multiple sips of water as needed. 5. Return to clinic in two months for periodic oral exam after chemoradiation therapy. Call if problems before then.  Lenn Cal, DDS

## 2014-05-31 NOTE — Assessment & Plan Note (Signed)
The feeding tube looks fine with no infection

## 2014-05-31 NOTE — Assessment & Plan Note (Signed)
She tolerated treatment well apart from minor side effects. I will continue supportive care weekly.

## 2014-05-31 NOTE — Assessment & Plan Note (Signed)
Tracheostomy site looks fine without signs of infection.

## 2014-06-01 ENCOUNTER — Other Ambulatory Visit: Payer: Self-pay | Admitting: *Deleted

## 2014-06-01 ENCOUNTER — Telehealth: Payer: Self-pay | Admitting: *Deleted

## 2014-06-01 ENCOUNTER — Ambulatory Visit: Payer: Self-pay

## 2014-06-01 ENCOUNTER — Encounter (HOSPITAL_COMMUNITY): Payer: Self-pay | Admitting: Emergency Medicine

## 2014-06-01 ENCOUNTER — Ambulatory Visit: Payer: BC Managed Care – PPO

## 2014-06-01 ENCOUNTER — Emergency Department (HOSPITAL_COMMUNITY)
Admission: EM | Admit: 2014-06-01 | Discharge: 2014-06-01 | Disposition: A | Discharge status: Home / Self Care | Payer: BC Managed Care – PPO | Attending: Emergency Medicine | Admitting: Emergency Medicine

## 2014-06-01 DIAGNOSIS — Z859 Personal history of malignant neoplasm, unspecified: Secondary | ICD-10-CM | POA: Diagnosis not present

## 2014-06-01 DIAGNOSIS — Z79899 Other long term (current) drug therapy: Secondary | ICD-10-CM | POA: Diagnosis not present

## 2014-06-01 DIAGNOSIS — I10 Essential (primary) hypertension: Secondary | ICD-10-CM | POA: Insufficient documentation

## 2014-06-01 DIAGNOSIS — Z880 Allergy status to penicillin: Secondary | ICD-10-CM | POA: Insufficient documentation

## 2014-06-01 DIAGNOSIS — Z8639 Personal history of other endocrine, nutritional and metabolic disease: Secondary | ICD-10-CM | POA: Insufficient documentation

## 2014-06-01 DIAGNOSIS — C109 Malignant neoplasm of oropharynx, unspecified: Secondary | ICD-10-CM

## 2014-06-01 DIAGNOSIS — Z87891 Personal history of nicotine dependence: Secondary | ICD-10-CM | POA: Diagnosis not present

## 2014-06-01 DIAGNOSIS — Z862 Personal history of diseases of the blood and blood-forming organs and certain disorders involving the immune mechanism: Secondary | ICD-10-CM | POA: Diagnosis not present

## 2014-06-01 DIAGNOSIS — J45909 Unspecified asthma, uncomplicated: Secondary | ICD-10-CM | POA: Insufficient documentation

## 2014-06-01 DIAGNOSIS — M129 Arthropathy, unspecified: Secondary | ICD-10-CM | POA: Diagnosis not present

## 2014-06-01 DIAGNOSIS — R112 Nausea with vomiting, unspecified: Secondary | ICD-10-CM | POA: Insufficient documentation

## 2014-06-01 DIAGNOSIS — Z8742 Personal history of other diseases of the female genital tract: Secondary | ICD-10-CM | POA: Diagnosis not present

## 2014-06-01 LAB — URINALYSIS, ROUTINE W REFLEX MICROSCOPIC
Bilirubin Urine: NEGATIVE
GLUCOSE, UA: NEGATIVE mg/dL
HGB URINE DIPSTICK: NEGATIVE
Ketones, ur: NEGATIVE mg/dL
LEUKOCYTES UA: NEGATIVE
Nitrite: NEGATIVE
Protein, ur: NEGATIVE mg/dL
SPECIFIC GRAVITY, URINE: 1.014 (ref 1.005–1.030)
UROBILINOGEN UA: 0.2 mg/dL (ref 0.0–1.0)
pH: 8 (ref 5.0–8.0)

## 2014-06-01 MED ORDER — PROMETHAZINE HCL 25 MG PO TABS: 25.0000 mg | ORAL_TABLET | Freq: Four times a day (QID) | ORAL | Status: DC | PRN

## 2014-06-01 MED ORDER — METOCLOPRAMIDE HCL 5 MG/ML IJ SOLN
10.0000 mg | Freq: Once | INTRAMUSCULAR | Status: AC
  Administered 2014-06-01: 10 mg via INTRAVENOUS
  Filled 2014-06-01: qty 2

## 2014-06-01 MED ORDER — PROMETHAZINE HCL 25 MG RE SUPP: 25.0000 mg | Freq: Four times a day (QID) | RECTAL | Status: DC | PRN

## 2014-06-01 MED ORDER — DIPHENHYDRAMINE HCL 50 MG/ML IJ SOLN
25.0000 mg | Freq: Once | INTRAMUSCULAR | Status: AC
  Administered 2014-06-01: 25 mg via INTRAVENOUS
  Filled 2014-06-01: qty 1

## 2014-06-01 MED ORDER — SODIUM CHLORIDE 0.9 % IV BOLUS (SEPSIS)
1000.0000 mL | Freq: Once | INTRAVENOUS | Status: AC
  Administered 2014-06-01: 1000 mL via INTRAVENOUS

## 2014-06-01 MED ORDER — ONDANSETRON 8 MG PO TBDP: 8.0000 mg | ORAL_TABLET | Freq: Three times a day (TID) | ORAL | Status: DC | PRN

## 2014-06-01 MED ORDER — HEPARIN SOD (PORK) LOCK FLUSH 100 UNIT/ML IV SOLN
500.0000 [IU] | Freq: Once | INTRAVENOUS | Status: AC
  Administered 2014-06-01: 500 [IU]
  Filled 2014-06-01: qty 5

## 2014-06-01 NOTE — ED Notes (Signed)
Daughter reports will be here in 20 minutes.

## 2014-06-01 NOTE — ED Notes (Signed)
Pt daughter states she will not be able to pick pt up until 0830am. MD aware. Pt discharge papers ready when family arrives. Pt is currently resting.

## 2014-06-01 NOTE — Discharge Instructions (Signed)

## 2014-06-01 NOTE — ED Notes (Signed)
Daughter Sharyn Lull [phone]

## 2014-06-01 NOTE — ED Notes (Signed)
Bed: WA07 Expected date:  Expected time:  Means of arrival:  Comments: 53 yo F  N/V throat cancer

## 2014-06-01 NOTE — Telephone Encounter (Signed)
Spoke with patient's son after their return home from [facility] ED to check on her well being and her keeping today's appts.  He reported that shewill not be keeping her appts today; she is exhausted from her trip to the ED last HS and continues to have episodes of N&V.  He stated she cannot tolerate a nutritional feed rate of 50 cc/hr but is able to tolerate a rate of 30 cc/hr.  She has indicated she will keep tomorrow's appts.  [name] expressed that his mother needs regular nursing assistance at home beyond what [facility] is currently providing; "I can't do it myself".  Her family lives in [place] and is unable to provide regular/daily assistance.  I told him I would inform members of Stacey Carroll's Care Team of the current situation and expressed needs.  Gayleen Orem, RN, BSN, [place] [phone]

## 2014-06-01 NOTE — ED Notes (Signed)
Per EMS pt has had N/V for 3 days. Pt is being tx for throat cancer. Has had 6 radiation tx, 1 chemo tx over the past two weeks. Pt has trach and feeding tube. Pt is alert and oriented.

## 2014-06-01 NOTE — ED Notes (Signed)
Pt states due to trach she is unable to tolerate fluids PO. MD aware.

## 2014-06-01 NOTE — ED Provider Notes (Signed)
CSN: [id number]     Arrival date & time 06/01/14  0128 History   First MD Initiated Contact with Patient 06/01/14 0207     Chief Complaint  Patient presents with  . Nausea  . Emesis     (Consider location/radiation/quality/duration/timing/severity/associated sxs/prior Treatment) HPI 53 year old female presents to emergency department with complaint of nausea and vomiting for the last 3 days.  Patient diagnosed with throat cancer in June.  Patient now has trach and G-tube.  Patient currently undergoing chemotherapy and radiation therapy.  Patient prescribed Reglan yesterday at her oncology appointment, but due to the fact that she had a surgery appointment radiation oncology and dental appointment she was unable to fill a prescription for Reglan.  Patient receives all feedings through her G-tube.  She denies any current pain. Past Medical History  Diagnosis Date  . Visual disturbance 03/11/2013  . Dyslipidemia   . Headache(784.0)   . Depression   . Endometriosis   . Hypercholesterolemia   . Anxiety   . Scoliosis   . Arthritis   . Throat pain 04/19/2014  . Dysphagia 04/27/2014  . Cancer   . PONV (postoperative nausea and vomiting)   . Hypertension     not taking meds  . Asthma   . Cough   . Nausea alone 05/31/2014   Past Surgical History  Procedure Laterality Date  . Cervical laminectomy  2007 (?), 2011  . Shoulder surgery Left   . Abdominal hysterectomy  1987  . Laryngoscopy and esophagoscopy N/A 04/14/2014    Procedure: DIRECT LARYNGOSCOPY WITH BIOPSY  AND ESOPHAGOSCOPY;  Surgeon: Ruby Cola, MD;  Location: [facility];  Service: ENT;  Laterality: N/A;  . Portacath placement N/A 05/18/2014    Procedure: INSERTION PORT-A-CATH;  Surgeon: Rolm Bookbinder, MD;  Location: [facility];  Service: General;  Laterality: N/A;  . Gastrostomy N/A 05/18/2014    Procedure: GASTROSTOMY/PLACEMENT OF G-TUBE;  Surgeon: Rolm Bookbinder, MD;  Location: [facility];  Service: General;  Laterality: N/A;  .  Tracheostomy tube placement N/A 05/18/2014    Procedure: TRACHEOSTOMY;  Surgeon: Melissa Montane, MD;  Location: [facility] OR;  Service: ENT;  Laterality: N/A;   Family History  Problem Relation Age of Onset  . Emphysema Mother   . Cancer Mother     throat ca  . Cancer Father     prostate ca  . Heart disease Father    History  Substance Use Topics  . Smoking status: Former Smoker -- 0.50 packs/day for 39 years    Types: Cigarettes  . Smokeless tobacco: Never Used  . Alcohol Use: Yes     Comment: occasional   OB History   Grav Para Term Preterm Abortions TAB SAB Ect Mult Living                 Review of Systems   See History of Present Illness; otherwise all other systems are reviewed and negative  Allergies  Penicillins  Home Medications   Prior to Admission medications   Medication Sig Start Date End Date Taking? Authorizing Provider  acetaminophen (TYLENOL) 160 MG/5ML solution Take 20.3 mLs (650 mg total) by mouth every 4 (four) hours as needed. 05/23/14  Yes Emina Riebock, NP  diazepam (VALIUM) 10 MG tablet Take 5 mg by mouth every 6 (six) hours as needed for anxiety.    Yes Historical Provider, MD  fentaNYL (DURAGESIC - DOSED MCG/HR) 25 MCG/HR patch Place 1 patch (25 mcg total) onto the skin every 3 (three) days. 05/24/14  Yes Heath Lark, MD  fluticasone (FLONASE) 50 MCG/ACT nasal spray Place 2 sprays into both nostrils daily as needed for allergies.  03/29/14  Yes Historical Provider, MD  ipratropium-albuterol (DUONEB) 0.5-2.5 (3) MG/3ML SOLN Take 3 mLs by nebulization every 4 (four) hours as needed (for shortness of breath).  03/07/14  Yes Historical Provider, MD  lidocaine-prilocaine (EMLA) cream Apply 1 application topically as needed. 05/04/14  Yes Heath Lark, MD  metoCLOPramide (REGLAN) 10 MG tablet Take 1 tablet (10 mg total) by mouth 4 (four) times daily -  before meals and at bedtime. 05/31/14  Yes Heath Lark, MD  morphine (ROXANOL) 20 MG/ML concentrated solution Take 1 mL (20 mg  total) by mouth every 2 (two) hours as needed for severe pain. 05/04/14  Yes Heath Lark, MD  Nutritional Supplements (FEEDING SUPPLEMENT, JEVITY 1.2 CAL,) LIQD Place 1,000 mLs into feeding tube continuous. 05/23/14  Yes Emina Riebock, NP  ondansetron (ZOFRAN) 8 MG tablet Take 1 tablet (8 mg total) by mouth every 8 (eight) hours as needed for nausea. 05/04/14  Yes Heath Lark, MD  ondansetron (ZOFRAN-ODT) 4 MG disintegrating tablet Take 1-2 tablets (4-8 mg total) by mouth every 8 (eight) hours as needed for nausea or vomiting. 05/23/14  Yes Emina Riebock, NP  paroxetine (PAXIL) 10 MG/5ML suspension Take 20 mg by mouth every morning.   Yes Historical Provider, MD  polyethylene glycol (MIRALAX / GLYCOLAX) packet Take 17 g by mouth daily. 05/04/14  Yes Heath Lark, MD  prochlorperazine (COMPAZINE) 10 MG tablet Take 1 tablet (10 mg total) by mouth every 6 (six) hours as needed (Nausea or vomiting). 05/04/14  Yes Heath Lark, MD  sennosides (SENOKOT) 8.8 MG/5ML syrup Take 10 mLs by mouth at bedtime as needed for mild constipation. 05/23/14  Yes Emina Riebock, NP  sodium fluoride (FLUORISHIELD) 1.1 % GEL dental gel Instill one drop of fluoride per tooth space of fluoride tray. Place over teeth for 5 minutes. Remove. Spit out excess. Repeat nightly. 05/02/14  Yes Lenn Cal, DDS   BP 109/82  Pulse 71  Resp 16  SpO2 97% Physical Exam  Nursing note and vitals reviewed. Constitutional: She is oriented to person, place, and time. She appears well-developed and well-nourished. She appears distressed (uncomfortable appearing thin female).  HENT:  Head: Normocephalic and atraumatic.  Right Ear: External ear normal.  Left Ear: External ear normal.  Nose: Nose normal.  Mouth/Throat: Oropharynx is clear and moist.  Eyes: Conjunctivae and EOM are normal. Pupils are equal, round, and reactive to light.  Neck: Normal range of motion. Neck supple. No JVD present. No tracheal deviation present. No thyromegaly present.   Tracheostomy in place  Cardiovascular: Normal rate, regular rhythm, normal heart sounds and intact distal pulses.  Exam reveals no gallop and no friction rub.   No murmur heard. Pulmonary/Chest: Effort normal and breath sounds normal. No stridor. No respiratory distress. She has no wheezes. She has no rales. She exhibits no tenderness.  Abdominal: Soft. Bowel sounds are normal. She exhibits no distension and no mass. There is no tenderness. There is no rebound and no guarding.  G-tube in place  Musculoskeletal: Normal range of motion. She exhibits no edema and no tenderness.  Lymphadenopathy:    She has no cervical adenopathy.  Neurological: She is alert and oriented to person, place, and time. She exhibits normal muscle tone. Coordination normal.  Skin: Skin is warm and dry. No rash noted. No erythema. No pallor.  Psychiatric: She has a normal mood  and affect. Her behavior is normal. Judgment and thought content normal.    ED Course  Procedures (including critical care time) Labs Review Labs Reviewed  URINALYSIS, ROUTINE W REFLEX MICROSCOPIC    Imaging Review No results found.   EKG Interpretation None      MDM   Final diagnoses:  Nausea and vomiting, vomiting of unspecified type    53 year old female with nausea and vomiting, most likely due to to her chemotherapy regimen.  Plan to give the Reglan that was prescribed to her earlier today along with Benadryl and IV fluids.  We'll reassess after medications see if this helps her symptoms.   Kalman Drape, MD 06/01/14 [phone]

## 2014-06-01 NOTE — Progress Notes (Signed)
Notified Lurlean Leyden, RN w/ [facility] of new order for [facility].

## 2014-06-02 ENCOUNTER — Ambulatory Visit (HOSPITAL_BASED_OUTPATIENT_CLINIC_OR_DEPARTMENT_OTHER): Payer: BC Managed Care – PPO

## 2014-06-02 ENCOUNTER — Ambulatory Visit
Admission: RE | Admit: 2014-06-02 | Payer: BC Managed Care – PPO | Source: Ambulatory Visit | Admitting: Radiation Oncology

## 2014-06-02 ENCOUNTER — Telehealth: Payer: Self-pay | Admitting: *Deleted

## 2014-06-02 ENCOUNTER — Ambulatory Visit
Admission: RE | Admit: 2014-06-02 | Discharge: 2014-06-02 | Disposition: A | Discharge status: Home / Self Care | Payer: BC Managed Care – PPO | Source: Ambulatory Visit | Attending: Radiation Oncology | Admitting: Radiation Oncology

## 2014-06-02 ENCOUNTER — Telehealth (INDEPENDENT_AMBULATORY_CARE_PROVIDER_SITE_OTHER): Payer: Self-pay | Admitting: General Surgery

## 2014-06-02 VITALS — BP 137/78 | HR 84 | Temp 98.1°F | Resp 18

## 2014-06-02 DIAGNOSIS — Z510 Encounter for antineoplastic radiation therapy: Secondary | ICD-10-CM | POA: Diagnosis not present

## 2014-06-02 DIAGNOSIS — C100 Malignant neoplasm of vallecula: Secondary | ICD-10-CM

## 2014-06-02 DIAGNOSIS — R110 Nausea: Secondary | ICD-10-CM

## 2014-06-02 MED ORDER — HEPARIN SOD (PORK) LOCK FLUSH 100 UNIT/ML IV SOLN
500.0000 [IU] | Freq: Once | INTRAVENOUS | Status: AC | PRN
  Administered 2014-06-02: 500 [IU]
  Filled 2014-06-02: qty 5

## 2014-06-02 MED ORDER — SODIUM CHLORIDE 0.9 % IV SOLN
Freq: Once | INTRAVENOUS | Status: AC
  Administered 2014-06-02: 15:00:00 via INTRAVENOUS

## 2014-06-02 MED ORDER — SODIUM CHLORIDE 0.9 % IJ SOLN
10.0000 mL | INTRAMUSCULAR | Status: DC | PRN
  Administered 2014-06-02: 10 mL
  Filled 2014-06-02: qty 10

## 2014-06-02 NOTE — Patient Instructions (Signed)
Dehydration, Adult Dehydration is when you lose more fluids from the body than you take in. Vital organs like the kidneys, brain, and heart cannot function without a proper amount of fluids and salt. Any loss of fluids from the body can cause dehydration.  CAUSES   Vomiting.  Diarrhea.  Excessive sweating.  Excessive urine output.  Fever. SYMPTOMS  Mild dehydration  Thirst.  Dry lips.  Slightly dry mouth. Moderate dehydration  Very dry mouth.  Sunken eyes.  Skin does not bounce back quickly when lightly pinched and released.  Dark urine and decreased urine production.  Decreased tear production.  Headache. Severe dehydration  Very dry mouth.  Extreme thirst.  Rapid, weak pulse (more than 100 beats per minute at rest).  Cold hands and feet.  Not able to sweat in spite of heat and temperature.  Rapid breathing.  Blue lips.  Confusion and lethargy.  Difficulty being awakened.  Minimal urine production.  No tears. DIAGNOSIS  Your caregiver will diagnose dehydration based on your symptoms and your exam. Blood and urine tests will help confirm the diagnosis. The diagnostic evaluation should also identify the cause of dehydration. TREATMENT  Treatment of mild or moderate dehydration can often be done at home by increasing the amount of fluids that you drink. It is best to drink small amounts of fluid more often. Drinking too much at one time can make vomiting worse. Refer to the home care instructions below. Severe dehydration needs to be treated at the hospital where you will probably be given intravenous (IV) fluids that contain water and electrolytes. HOME CARE INSTRUCTIONS   Ask your caregiver about specific rehydration instructions.  Drink enough fluids to keep your urine clear or pale yellow.  Drink small amounts frequently if you have nausea and vomiting.  Eat as you normally do.  Avoid:  Foods or drinks high in sugar.  Carbonated  drinks.  Juice.  Extremely hot or cold fluids.  Drinks with caffeine.  Fatty, greasy foods.  Alcohol.  Tobacco.  Overeating.  Gelatin desserts.  Wash your hands well to avoid spreading bacteria and viruses.  Only take over-the-counter or prescription medicines for pain, discomfort, or fever as directed by your caregiver.  Ask your caregiver if you should continue all prescribed and over-the-counter medicines.  Keep all follow-up appointments with your caregiver. SEEK MEDICAL CARE IF:  You have abdominal pain and it increases or stays in one area (localizes).  You have a rash, stiff neck, or severe headache.  You are irritable, sleepy, or difficult to awaken.  You are weak, dizzy, or extremely thirsty. SEEK IMMEDIATE MEDICAL CARE IF:   You are unable to keep fluids down or you get worse despite treatment.  You have frequent episodes of vomiting or diarrhea.  You have blood or green matter (bile) in your vomit.  You have blood in your stool or your stool looks black and tarry.  You have not urinated in 6 to 8 hours, or you have only urinated a small amount of very dark urine.  You have a fever.  You faint. MAKE SURE YOU:   Understand these instructions.  Will watch your condition.  Will get help right away if you are not doing well or get worse. Document Released: 09/22/2005 Document Revised: 12/15/2011 Document Reviewed: 05/12/2011 [employer] Patient Information 2015 [employer]. This information is not intended to replace advice given to you by your health care provider. Make sure you discuss any questions you have with your health care   provider.  

## 2014-06-02 NOTE — Telephone Encounter (Signed)
I received a phone call from the cancer center nurse question the patient's tube feedings. The patient was starting to to the tear in clinic. She states that there was some feedings or come in to the insertion site. She also states there was some erythema underneath the disc. I discussed with her it would be okay to clip one stitch placed gauze underneath the disc. The patient followup with [place] as needed call for any concerns.

## 2014-06-02 NOTE — Progress Notes (Signed)
Patient brought to nursing prior to receiving radiation treatment. Son with her. She states when she put the mouth guard in she began to gag and vomited x 1. Pt states she took a medication under her tongue this morning for nausea. She has medications with her. Advised she take Zofran and take regularly if nausea is ongoing problem for her. Pt has Compazine but has been prescribed Reglan. Pharmacy instructions re: Reglan stated not to take Compazine with Reglan. Son marked Compazine bottle so he would not give to pt. Pt able to return to [facility] for treatment. She then went up for IVF. She understood to register for IVF.

## 2014-06-02 NOTE — Telephone Encounter (Signed)
Patient's son called this morning, asking on his mother's behalf if IVF necessary today since she received fluids in the ED yesterday.  I indicated that she will benefit from additional fluids, he verbzliaed understanding.  Gayleen Orem, RN, BSN, [place] at [place] [phone]

## 2014-06-02 NOTE — Progress Notes (Signed)
Upon getting ready to discharge patient, she noticed that her G-tube was leaking. Pt was doing her continuous tube feedings while here. Tune feedings turned off. G-tube site was assessed by myself and another RN - skin reddened and inflamed. I called [facility] [place] [facility] who placed the tube and spoke directly with Dr. Rosendo Gros. He instructed [place] to remove one of the sutures, clean site with saline and place gauze under the plastic ring. Patient told to clean site with saline and replace gauze as needed at home. Also told pt to call [facility] [place] [facility] if she develops a fever or site becomes more tender, painful or reddened. Patient agreeable to this.

## 2014-06-05 ENCOUNTER — Encounter: Payer: Self-pay | Admitting: *Deleted

## 2014-06-05 ENCOUNTER — Ambulatory Visit
Admission: RE | Admit: 2014-06-05 | Discharge: 2014-06-05 | Disposition: A | Discharge status: Home / Self Care | Payer: BC Managed Care – PPO | Source: Ambulatory Visit | Attending: Radiation Oncology | Admitting: Radiation Oncology

## 2014-06-05 ENCOUNTER — Ambulatory Visit: Payer: Self-pay

## 2014-06-05 ENCOUNTER — Telehealth: Payer: Self-pay | Admitting: *Deleted

## 2014-06-05 ENCOUNTER — Other Ambulatory Visit: Payer: Self-pay

## 2014-06-05 ENCOUNTER — Encounter (HOSPITAL_COMMUNITY): Payer: Self-pay | Admitting: Emergency Medicine

## 2014-06-05 ENCOUNTER — Emergency Department (HOSPITAL_COMMUNITY)
Admission: EM | Admit: 2014-06-05 | Discharge: 2014-06-05 | Disposition: A | Discharge status: Home / Self Care | Payer: BC Managed Care – PPO | Attending: Emergency Medicine | Admitting: Emergency Medicine

## 2014-06-05 ENCOUNTER — Encounter: Payer: Self-pay | Admitting: Radiation Oncology

## 2014-06-05 ENCOUNTER — Telehealth (INDEPENDENT_AMBULATORY_CARE_PROVIDER_SITE_OTHER): Payer: Self-pay

## 2014-06-05 VITALS — BP 118/78 | HR 85 | Temp 98.8°F | Resp 20 | Wt 117.6 lb

## 2014-06-05 DIAGNOSIS — Z8739 Personal history of other diseases of the musculoskeletal system and connective tissue: Secondary | ICD-10-CM | POA: Insufficient documentation

## 2014-06-05 DIAGNOSIS — Z79899 Other long term (current) drug therapy: Secondary | ICD-10-CM | POA: Diagnosis not present

## 2014-06-05 DIAGNOSIS — C109 Malignant neoplasm of oropharynx, unspecified: Secondary | ICD-10-CM

## 2014-06-05 DIAGNOSIS — Z8669 Personal history of other diseases of the nervous system and sense organs: Secondary | ICD-10-CM | POA: Diagnosis not present

## 2014-06-05 DIAGNOSIS — Z880 Allergy status to penicillin: Secondary | ICD-10-CM | POA: Insufficient documentation

## 2014-06-05 DIAGNOSIS — J45909 Unspecified asthma, uncomplicated: Secondary | ICD-10-CM | POA: Diagnosis not present

## 2014-06-05 DIAGNOSIS — Z859 Personal history of malignant neoplasm, unspecified: Secondary | ICD-10-CM | POA: Diagnosis not present

## 2014-06-05 DIAGNOSIS — F329 Major depressive disorder, single episode, unspecified: Secondary | ICD-10-CM | POA: Diagnosis not present

## 2014-06-05 DIAGNOSIS — Z8639 Personal history of other endocrine, nutritional and metabolic disease: Secondary | ICD-10-CM | POA: Insufficient documentation

## 2014-06-05 DIAGNOSIS — Z87891 Personal history of nicotine dependence: Secondary | ICD-10-CM | POA: Insufficient documentation

## 2014-06-05 DIAGNOSIS — F3289 Other specified depressive episodes: Secondary | ICD-10-CM | POA: Diagnosis not present

## 2014-06-05 DIAGNOSIS — F411 Generalized anxiety disorder: Secondary | ICD-10-CM | POA: Insufficient documentation

## 2014-06-05 DIAGNOSIS — K9423 Gastrostomy malfunction: Secondary | ICD-10-CM | POA: Diagnosis present

## 2014-06-05 DIAGNOSIS — IMO0002 Reserved for concepts with insufficient information to code with codable children: Secondary | ICD-10-CM | POA: Insufficient documentation

## 2014-06-05 DIAGNOSIS — I10 Essential (primary) hypertension: Secondary | ICD-10-CM | POA: Diagnosis not present

## 2014-06-05 DIAGNOSIS — Z510 Encounter for antineoplastic radiation therapy: Secondary | ICD-10-CM | POA: Diagnosis not present

## 2014-06-05 DIAGNOSIS — Z862 Personal history of diseases of the blood and blood-forming organs and certain disorders involving the immune mechanism: Secondary | ICD-10-CM | POA: Insufficient documentation

## 2014-06-05 DIAGNOSIS — C100 Malignant neoplasm of vallecula: Secondary | ICD-10-CM

## 2014-06-05 DIAGNOSIS — Z8742 Personal history of other diseases of the female genital tract: Secondary | ICD-10-CM | POA: Insufficient documentation

## 2014-06-05 MED ORDER — SUCRALFATE 1 G PO TABS
ORAL_TABLET | ORAL | Status: DC
Start: 2014-06-05 — End: 2014-06-05

## 2014-06-05 MED ORDER — ONDANSETRON 8 MG PO TBDP: 8.0000 mg | ORAL_TABLET | Freq: Three times a day (TID) | ORAL | Status: DC | PRN

## 2014-06-05 NOTE — Telephone Encounter (Signed)
Received call from son stating that Teaira's PEG has fallen out.  Called her, she reported same, has someone with her who can take her to wherever she needs to go to have it replaced.   [facility], spoke with RN Jearld Fenton who is working with Dr. Hulen Skains today.  She indicated practitioners do not replace PEGS at [place], rather when needed, they refer to [facility].  She stated she would make arrangements with [facility] and call patient with necessary information.  Called son to inform.  Gayleen Orem, RN, BSN, [place] at [place] [phone]

## 2014-06-05 NOTE — ED Notes (Signed)
Pt reports her feeding tube fell out on Sunday, pt cleaned it and reinserted it. Went to cancer center today and they replaced it, pt reports 30 mins later the peg tube fell back out. Pt has bandage applied pta, no complaints and no distress noted at triage.

## 2014-06-05 NOTE — ED Notes (Signed)
Pt reports feeding tube fell out, pt states she replaced tube herself and later had it checked at the cancer center where it was rewrapped by staff. Pt states she had the tube in for 30hrs and when she was walking around at [facility] it fell out. Pt denies any pain at this time. Site slightly pink with drainage.

## 2014-06-05 NOTE — ED Provider Notes (Signed)
  This was a shared visit with a mid-level provided (NP or PA).  Throughout the patient's course I was available for consultation/collaboration.  I saw the ECG (if appropriate), relevant labs and studies - I agree with the interpretation.  On my exam the patient was in no distress.  She is soft, no peritoneal abdomen, was awake and alert, hemodynamically stable. An attempt to replace the PEG tube was unsuccessful.  This was well tolerated.  Subsequently, given the hour of the night, interventional radiology was unavailable to to complete the procedure, and we arranged for next morning followup to have this done.       Carmin Muskrat, MD 06/05/14 [phone]

## 2014-06-05 NOTE — Progress Notes (Signed)
Weekly rad txs h/n, patient stated no c/o pain, but that her peg tube fell out Sunday after her sister-in-law clipped the stitch, one of the sutures removed on 06/02/14 at Dr.'s office, , "I put it back in myself,it isn't leaking anymore, still red  And inflammed, not as bad stated patient", yelow exudate around peg site, cleansed area with normal saline, applied drain sponge dressing under peg tubing, and secured with medipore gause, gave rest of the roll to patient, she stated, she has gained 3 lbs since last week, no more leakage, no nausea, sips water, but flushes free water before and after feedings ,informed patient she may need to go to Interventional radiology for correct placement check"Why, i'ts not leaking I'm fine", informed  MD of status , 2:22 PM

## 2014-06-05 NOTE — ED Notes (Signed)
Discharge instructions reviewed with pt. Pt verbalized understanding.   

## 2014-06-05 NOTE — Telephone Encounter (Signed)
Cancer center called to let us know pt's G tube has come out again.  The tube came out last Sunday and the pt attempted to place it back in.  Pt is on continuous tube feedings.  Instructed her to go to the ED to have the tube evaluated and replaced if necessary.  Pt agreed.

## 2014-06-05 NOTE — Progress Notes (Signed)
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  CBCT/MVCT images/Port film x-rays were reviewed.  The chart was checked. No c/o pain, but that her peg tube fell out Sunday after her sister-in-law clipped the stitch, one of the sutures removed on 06/02/14 at Dr.'s office.  Gaspar Garbe RN noted yelow exudate around peg site, cleansed area with normal saline, applied drain sponge dressing under peg tubing, and secured with medipore gauze.  She has gained 1-2 lbs since last week, no more PEG leakage, no nausea, sips water, but flushes free water before and after feedings.   Physical Findings:  weight is 117 lb 9.6 oz (53.343 kg). Her oral temperature is 98.8 F (37.1 C). Her blood pressure is 118/78 and her pulse is 85. Her respiration is 20.  NAD, Oropharyngeal mucosa is intact with no thrush or lesions. . Skin intact and smooth over neck.  Trach site clean. PEG site bandaged.   CBC    Component Value Date/Time   WBC 11.3* 05/22/2014 0310   WBC 11.0* 05/04/2014 1446   RBC 3.65* 05/22/2014 0310   RBC 4.48 05/04/2014 1446   HGB 11.2* 05/22/2014 0310   HGB 14.2 05/04/2014 1446   HCT 33.1* 05/22/2014 0310   HCT 41.2 05/04/2014 1446   PLT 309 05/22/2014 0310   PLT 329 05/04/2014 1446   MCV 90.7 05/22/2014 0310   MCV 92.0 05/04/2014 1446   MCH 30.7 05/22/2014 0310   MCH 31.7 05/04/2014 1446   MCHC 33.8 05/22/2014 0310   MCHC 34.5 05/04/2014 1446   RDW 13.4 05/22/2014 0310   RDW 13.2 05/04/2014 1446   LYMPHSABS 2.4 05/04/2014 1446   LYMPHSABS 2.9 04/14/2014 1020   MONOABS 0.9 05/04/2014 1446   MONOABS 1.6* 04/14/2014 1020   EOSABS 0.3 05/04/2014 1446   EOSABS 0.2 04/14/2014 1020   BASOSABS  0.0 05/04/2014 1446   BASOSABS 0.1 04/14/2014 1020     CMP     Component Value Date/Time   NA 140 05/23/2014 0430   NA 140 05/04/2014 1446   K 4.2 05/23/2014 0430   K 4.2 05/04/2014 1446   CL 100 05/23/2014 0430   CO2 31 05/23/2014 0430   CO2 28 05/04/2014 1446   GLUCOSE 107* 05/23/2014 0430   GLUCOSE 91 05/04/2014 1446   BUN 13 05/23/2014 0430   BUN 13.7 05/04/2014 1446   CREATININE 0.62 05/23/2014 0430   CREATININE 0.9 05/04/2014 1446   CALCIUM 9.7 05/23/2014 0430   CALCIUM 10.0 05/04/2014 1446   PROT 5.6* 05/22/2014 0310   PROT 7.4 05/04/2014 1446   ALBUMIN 2.2* 05/22/2014 0310   ALBUMIN 3.3* 05/04/2014 1446   AST 12 05/22/2014 0310   AST 13 05/04/2014 1446   ALT 9 05/22/2014 0310   ALT 7 05/04/2014 1446   ALKPHOS 77 05/22/2014 0310   ALKPHOS 106 05/04/2014 1446   BILITOT 0.2* 05/22/2014 0310   BILITOT 0.44 05/04/2014 1446   GFRNONAA >90 05/23/2014 0430   GFRAA >90 05/23/2014 0430     Impression:  The patient is tolerating radiotherapy.   Plan:  Continue radiotherapy as planned. I will ask Gayleen Orem, RN, our Head and Neck  Oncology Navigator to contact general surgery re: PEG tube falling out for further guidance.   -----------------------------------  Eppie Gibson, MD

## 2014-06-05 NOTE — Discharge Instructions (Signed)
Report to [facility] tomorrow to Interventional Radiology.  You should call in the morning before you go.  If you encounter any problems, please report to [facility] Emergency Department.   Gastrostomy Tube Home Guide A gastrostomy tube is a tube that is surgically placed into the stomach. It is also called a "G-tube." G-tubes are used when a person is unable to eat and drink enough on their own to stay healthy. The tube is inserted into the stomach through a small cut (incision) in the skin. This tube is used for:  Feeding.  Giving medication. GASTROSTOMY TUBE CARE  Wash your hands with soap and water.  Remove the old dressing (if any). Some styles of G-tubes may need a dressing inserted between the skin and the G-tube. Other types of G-tubes do not require a dressing. Ask your health care provider if a dressing is needed.  Check the area where the tube enters the skin (insertion site) for redness, swelling, or pus-like (purulent) drainage. A small amount of clear or tan liquid drainage is normal. Check to make sure scar tissue (skin) is not growing around the insertion site. This could have a raised, bumpy appearance.  A cotton swab can be used to clean the skin around the tube:  When the G-tube is first put in, a normal saline solution or water can be used to clean the skin.  Mild soap and warm water can be used when the skin around the G-tube site has healed.  Roll the cotton swab around the G-tube insertion site to remove any drainage or crusting at the insertion site. STOMACH RESIDUALS Feeding tube residuals are the amount of liquids that are in the stomach at any given time. Residuals may be checked before giving feedings, medications, or as instructed by your health care provider.  Ask your health care provider if there are instances when you would not start tube feedings depending on the amount or type of contents withdrawn from the stomach.  Check residuals by  attaching a syringe to the G-tube and pulling back on the syringe plunger. Note the amount, and return the residual back into the stomach. FLUSHING THE G-TUBE  The G-tube should be periodically flushed with clean warm water to keep it from clogging.  Flush the G-tube after feedings or medications. Draw up 30 mL of warm water in a syringe. Connect the syringe to the G-tube and slowly push the water into the tube.  Do not push feedings, medications, or flushes rapidly. Flush the G-tube gently and slowly.  Only use syringes made for G-tubes to flush medications or feedings.  Your health care provider may want the G-tube flushed more often or with more water. If this is the case, follow your health care provider's instructions. FEEDINGS Your health care provider will determine whether feedings are given as a bolus (a certain amount given at one time and at scheduled times) or whether feedings will be given continuously on a feeding pump.   Formulas should be given at room temperature.  If feedings are continuous, no more than 4 hours worth of feedings should be placed in the feeding bag. This helps prevent spoilage or accidental excess infusion.  Cover and place unused formula in the refrigerator.  If feedings are continuous, stop the feedings when medications or flushes are given. Be sure to restart the feedings.  Feeding bags and syringes should be replaced as instructed by your health care provider. GIVING MEDICATION   In general, it is  best if all medications are in a liquid form for G-tube administration. Liquid medications are less likely to clog the G-tube.  Mix the liquid medication with 30 mL (or amount recommended by your health care provider) of warm water.  Draw up the medication into the syringe.  Attach the syringe to the G-tube and slowly push the mixture into the G-tube.  After giving the medication, draw up 30 mL of warm water in the syringe and slowly flush the  G-tube.  For pills or capsules, check with your health care provider first before crushing medications. Some pills are not effective if they are crushed. Some capsules are sustained-release medications.  If appropriate, crush the pill or capsule and mix with 30 mL of warm water. Using the syringe, slowly push the medication through the tube, then flush the tube with another 30 mL of tap water. G-TUBE PROBLEMS G-tube was pulled out.  Cause: May have been pulled out accidentally.  Solutions: Cover the opening with clean dressing and tape. Call your health care provider right away. The G-tube should be put in as soon as possible (within 4 hours) so the G-tube opening (tract) does not close. The G-tube needs to be put in at a health care setting. An X-ray needs to be done to confirm placement before the G-tube can be used again. Redness, irritation, soreness, or foul odor around the gastrostomy site.  Cause: May be caused by leakage or infection.  Solutions: Call your health care provider right away. Large amount of leakage of fluid or mucus-like liquid present (a large amount means it soaks clothing).  Cause: Many reasons could cause the G-tube to leak.  Solutions: Call your health care provider to discuss the amount of leakage. Skin or scar tissue appears to be growing where tube enters skin.   Cause: Tissue growth may develop around the insertion site if the G-tube is moved or pulled on excessively.  Solutions: Secure tube with tape so that excess movement does not occur. Call your health care provider. G-tube is clogged.  Cause: Thick formula or medication.  Solutions: Try to slowly push warm water into the tube with a large syringe. Never try to push any object into the tube to unclog it. Do not force fluid into the G-tube. If you are unable to unclog the tube, call your health care provider right away. TIPS  Head of bed (HOB) position refers to the upright position of a person's  upper body.  When giving medications or a feeding bolus, keep the [facility] up as told by your health care provider. Do this during the feeding and for 1 hour after the feeding or medication administration.  If continuous feedings are being given, it is best to keep the [facility] up as told by your health care provider. When ADLs (activities of daily living) are performed and the [facility] needs to be flat, be sure to turn the feeding pump off. Restart the feeding pump when the [facility] is returned to the recommended height.  Do not pull or put tension on the tube.  To prevent fluid backflow, kink the G-tube before removing the cap or disconnecting a syringe.  Check the G-tube length every day. Measure from the insertion site to the end of the G-tube. If the length is longer than previous measurements, the tube may be coming out. Call your health care provider if you notice increasing G-tube length.  Oral care, such as brushing teeth, must be continued.  You may need to  remove excess air (vent) from the G-tube. Your health care provider will tell you if this is needed.  Always call your health care provider if you have questions or problems with the G-tube. SEEK IMMEDIATE MEDICAL CARE IF:   You have severe abdominal pain, tenderness, or abdominal bloating (distension).  You have nausea or vomiting.  You are constipated or have problems moving your bowels.  The G-tube insertion site is red, swollen, has a foul smell, or has yellow or brown drainage.  You have difficulty breathing or shortness of breath.  You have a fever.  You have a large amount of feeding tube residuals.  The G-tube is clogged and cannot be flushed. MAKE SURE YOU:   Understand these instructions.  Will watch your condition.  Will get help right away if you are not doing well or get worse. Document Released: 12/01/2001 Document Revised: 02/06/2014 Document Reviewed: 05/30/2013 [employer] Patient Information 2015 [place], [place].  This information is not intended to replace advice given to you by your health care provider. Make sure you discuss any questions you have with your health care provider.

## 2014-06-05 NOTE — Progress Notes (Addendum)
Spoke with patient prior to today's 1:00 RT.  She reported that her feeding tube came out over the weekend, she reinserted after cleaning with [place], secured with tape.  She stated she was able to continue with continuous feedings without complication.  I encourage her to inform the RN when she sees Dr. Isidore Moos for Weekly UT today.  She acknowledged.  Gayleen Orem, RN, BSN, [place] at [place] [phone]

## 2014-06-05 NOTE — ED Provider Notes (Signed)
CSN: [id number]     Arrival date & time 06/05/14  1732 History   First MD Initiated Contact with Patient 06/05/14 1902     No chief complaint on file.    (Consider location/radiation/quality/duration/timing/severity/associated sxs/prior Treatment) HPI Comments: Patient presents emergency department with chief complaint of feeding tube fell out. Patient states that her feeding tube.yesterday. She states that she reinserted and taped down. She states that thought again today, and she was told to come to the department replaced. The feeding tube was originally placed 3 weeks ago. It was placed so that she can get throat cancer treatment. She was sent to the emergency department to have the tube replaced by interventional radiology. She denies any pain. There are no aggravating or alleviating factors.  The history is provided by the patient. No language interpreter was used.    Past Medical History  Diagnosis Date  . Visual disturbance 03/11/2013  . Dyslipidemia   . Headache(784.0)   . Depression   . Endometriosis   . Hypercholesterolemia   . Anxiety   . Scoliosis   . Arthritis   . Throat pain 04/19/2014  . Dysphagia 04/27/2014  . Cancer   . PONV (postoperative nausea and vomiting)   . Hypertension     not taking meds  . Asthma   . Cough   . Nausea alone 05/31/2014   Past Surgical History  Procedure Laterality Date  . Cervical laminectomy  2007 (?), 2011  . Shoulder surgery Left   . Abdominal hysterectomy  1987  . Laryngoscopy and esophagoscopy N/A 04/14/2014    Procedure: DIRECT LARYNGOSCOPY WITH BIOPSY  AND ESOPHAGOSCOPY;  Surgeon: Ruby Cola, MD;  Location: [place];  Service: ENT;  Laterality: N/A;  . Portacath placement N/A 05/18/2014    Procedure: INSERTION PORT-A-CATH;  Surgeon: Rolm Bookbinder, MD;  Location: [place];  Service: General;  Laterality: N/A;  . Gastrostomy N/A 05/18/2014    Procedure: GASTROSTOMY/PLACEMENT OF G-TUBE;  Surgeon: Rolm Bookbinder, MD;  Location: [place];  Service: General;  Laterality: N/A;  . Tracheostomy tube placement N/A 05/18/2014    Procedure: TRACHEOSTOMY;  Surgeon: Melissa Montane, MD;  Location: [facility] OR;  Service: ENT;  Laterality: N/A;   Family History  Problem Relation Age of Onset  . Emphysema Mother   . Cancer Mother     throat ca  . Cancer Father     prostate ca  . Heart disease Father    History  Substance Use Topics  . Smoking status: Former Smoker -- 0.50 packs/day for 39 years    Types: Cigarettes  . Smokeless tobacco: Never Used  . Alcohol Use: Yes     Comment: occasional   OB History   Grav Para Term Preterm Abortions TAB SAB Ect Mult Living                 Review of Systems  Constitutional: Negative for fever and chills.  Respiratory: Negative for shortness of breath.   Cardiovascular: Negative for chest pain.  Gastrointestinal: Negative for nausea, vomiting, diarrhea and constipation.  Genitourinary: Negative for dysuria.  All other systems reviewed and are negative.     Allergies  Penicillins  Home Medications   Prior to Admission medications   Medication Sig Start Date End Date Taking? Authorizing Provider  acetaminophen (TYLENOL) 160 MG/5ML solution Take 20.3 mLs (650 mg total) by mouth every 4 (four) hours as needed. 05/23/14   Emina Riebock, NP  diazepam (VALIUM) 10 MG tablet Take 5 mg by  mouth every 6 (six) hours as needed for anxiety.     Historical Provider, MD  fentaNYL (DURAGESIC - DOSED MCG/HR) 25 MCG/HR patch Place 1 patch (25 mcg total) onto the skin every 3 (three) days. 05/24/14   Heath Lark, MD  fluticasone (FLONASE) 50 MCG/ACT nasal spray Place 2 sprays into both nostrils daily as needed for allergies.  03/29/14   Historical Provider, MD  ipratropium-albuterol (DUONEB) 0.5-2.5 (3) MG/3ML SOLN Take 3 mLs by nebulization every 4 (four) hours as needed (for shortness of breath).  03/07/14   Historical Provider, MD  lidocaine-prilocaine (EMLA) cream Apply 1 application topically as needed.  05/04/14   Heath Lark, MD  metoCLOPramide (REGLAN) 10 MG tablet Take 1 tablet (10 mg total) by mouth 4 (four) times daily -  before meals and at bedtime. 05/31/14   Heath Lark, MD  morphine (ROXANOL) 20 MG/ML concentrated solution Take 1 mL (20 mg total) by mouth every 2 (two) hours as needed for severe pain. 05/04/14   Heath Lark, MD  Nutritional Supplements (FEEDING SUPPLEMENT, JEVITY 1.2 CAL,) LIQD Place 1,000 mLs into feeding tube continuous. 05/23/14   Emina Riebock, NP  ondansetron (ZOFRAN ODT) 8 MG disintegrating tablet Take 1 tablet (8 mg total) by mouth every 8 (eight) hours as needed for nausea or vomiting. 06/05/14   Eppie Gibson, MD  ondansetron (ZOFRAN) 8 MG tablet Take 1 tablet (8 mg total) by mouth every 8 (eight) hours as needed for nausea. 05/04/14   Heath Lark, MD  paroxetine (PAXIL) 10 MG/5ML suspension Take 20 mg by mouth every morning.    Historical Provider, MD  polyethylene glycol (MIRALAX / GLYCOLAX) packet Take 17 g by mouth daily. 05/04/14   Heath Lark, MD  promethazine (PHENERGAN) 25 MG suppository Place 1 suppository (25 mg total) rectally every 6 (six) hours as needed for nausea or vomiting. 06/01/14   Kalman Drape, MD  promethazine (PHENERGAN) 25 MG tablet Take 1 tablet (25 mg total) by mouth every 6 (six) hours as needed for nausea. 06/01/14   Kalman Drape, MD  sennosides (SENOKOT) 8.8 MG/5ML syrup Take 10 mLs by mouth at bedtime as needed for mild constipation. 05/23/14   Emina Riebock, NP  sodium fluoride (FLUORISHIELD) 1.1 % GEL dental gel Instill one drop of fluoride per tooth space of fluoride tray. Place over teeth for 5 minutes. Remove. Spit out excess. Repeat nightly. 05/02/14   Lenn Cal, DDS  sucralfate (CARAFATE) 1 G tablet Dissolve 1 tablet in 10 mL H20 and swallow up to QID prn sore throat 06/05/14   Eppie Gibson, MD   BP 114/63  Pulse 94  Temp(Src) 98.2 F (36.8 C) (Oral)  Resp 18  SpO2 97% Physical Exam  Nursing note and vitals reviewed. Constitutional:  She is oriented to person, place, and time. She appears well-developed and well-nourished.  HENT:  Head: Normocephalic and atraumatic.  tracheostomy  Eyes: Conjunctivae and EOM are normal. Pupils are equal, round, and reactive to light.  Neck: Normal range of motion. Neck supple.  Cardiovascular: Normal rate and regular rhythm.  Exam reveals no gallop and no friction rub.   No murmur heard. Pulmonary/Chest: Effort normal and breath sounds normal. No respiratory distress. She has no wheezes. She has no rales. She exhibits no tenderness.  Abdominal: Soft. Bowel sounds are normal. She exhibits no distension and no mass. There is no tenderness. There is no rebound and no guarding.  Occluded feeding tube ostomy site  Musculoskeletal: Normal range of motion.  She exhibits no edema and no tenderness.  Neurological: She is alert and oriented to person, place, and time.  Skin: Skin is warm and dry.  Psychiatric: She has a normal mood and affect. Her behavior is normal. Judgment and thought content normal.    ED Course  Procedures (including critical care time) Labs Review Labs Reviewed - No data to display  Imaging Review No results found.   EKG Interpretation None      MDM   Final diagnoses:  Gastrostomy tube dysfunction    Patient with G-tube that fell out earlier today.  The tube has been out approximately 5 hours.  Unfortunately general supplies did not have the correct g-tube.  Dr. Vanita Panda and I attempted to insert a 18 fr catheter to maintain patency, but we were unsuccessful.    I ordered IR g-tube under flouro, and will have the patient report to IR at [facility] tomorrow.    Montine Circle, PA-C 06/05/14 2204

## 2014-06-06 ENCOUNTER — Other Ambulatory Visit: Payer: Self-pay

## 2014-06-06 ENCOUNTER — Ambulatory Visit (HOSPITAL_BASED_OUTPATIENT_CLINIC_OR_DEPARTMENT_OTHER): Payer: BC Managed Care – PPO

## 2014-06-06 ENCOUNTER — Ambulatory Visit
Admission: RE | Admit: 2014-06-06 | Discharge: 2014-06-06 | Disposition: A | Discharge status: Home / Self Care | Payer: BC Managed Care – PPO | Source: Ambulatory Visit | Attending: Radiation Oncology | Admitting: Radiation Oncology

## 2014-06-06 ENCOUNTER — Telehealth: Payer: Self-pay | Admitting: *Deleted

## 2014-06-06 ENCOUNTER — Ambulatory Visit: Payer: BC Managed Care – PPO | Admitting: Nutrition

## 2014-06-06 ENCOUNTER — Ambulatory Visit (HOSPITAL_COMMUNITY)
Admission: RE | Admit: 2014-06-06 | Discharge: 2014-06-06 | Disposition: A | Discharge status: Home / Self Care | Payer: BC Managed Care – PPO | Source: Ambulatory Visit | Attending: Emergency Medicine | Admitting: Emergency Medicine

## 2014-06-06 VITALS — BP 131/82 | HR 73 | Temp 98.2°F

## 2014-06-06 DIAGNOSIS — Z431 Encounter for attention to gastrostomy: Secondary | ICD-10-CM | POA: Insufficient documentation

## 2014-06-06 DIAGNOSIS — C100 Malignant neoplasm of vallecula: Secondary | ICD-10-CM

## 2014-06-06 DIAGNOSIS — Z510 Encounter for antineoplastic radiation therapy: Secondary | ICD-10-CM | POA: Diagnosis not present

## 2014-06-06 DIAGNOSIS — C760 Malignant neoplasm of head, face and neck: Secondary | ICD-10-CM | POA: Insufficient documentation

## 2014-06-06 DIAGNOSIS — R110 Nausea: Secondary | ICD-10-CM

## 2014-06-06 MED ORDER — SODIUM CHLORIDE 0.9 % IJ SOLN
10.0000 mL | INTRAMUSCULAR | Status: DC | PRN
  Administered 2014-06-06: 10 mL
  Filled 2014-06-06: qty 10

## 2014-06-06 MED ORDER — ONDANSETRON 8 MG/NS 50 ML IVPB
INTRAVENOUS | Status: AC
  Filled 2014-06-06: qty 8

## 2014-06-06 MED ORDER — LIDOCAINE VISCOUS 2 % MT SOLN
OROMUCOSAL | Status: AC
  Filled 2014-06-06: qty 15

## 2014-06-06 MED ORDER — SODIUM CHLORIDE 0.9 % IV SOLN
Freq: Once | INTRAVENOUS | Status: AC
  Administered 2014-06-06: 14:00:00 via INTRAVENOUS

## 2014-06-06 MED ORDER — ONDANSETRON 8 MG/50ML IVPB (CHCC)
8.0000 mg | Freq: Every day | INTRAVENOUS | Status: DC | PRN
  Administered 2014-06-06: 8 mg via INTRAVENOUS

## 2014-06-06 MED ORDER — HEPARIN SOD (PORK) LOCK FLUSH 100 UNIT/ML IV SOLN
500.0000 [IU] | Freq: Once | INTRAVENOUS | Status: AC | PRN
  Administered 2014-06-06: 500 [IU]
  Filled 2014-06-06: qty 5

## 2014-06-06 NOTE — Telephone Encounter (Signed)
Spoke with Anne Ng at ca. 9:00 this morning.  She reported that she is to arrive at 12:30 to [facility] IR for a 1:00 appt to replace her PEG and this conflicts with her appt for IVF.    1.  She requested a cancellation of IVF.  I re-educated her on the importance of IVF for maintenance of hydration, kidney function and conservation of stomach capacity for nutritional supplement.  She verbalized understanding and responded willingness to proceed with IVF after her IR appt.  I spoke with Kenney Houseman, Infusion Agricultural consultant, who indicated that they can begin her IVF as soon as she is available.   I communicated this to [place] in follow-up phone call.  2.  She requested a Tuesday, Thursday schedule for IVF; and is agreeable to Saturday morning as well.  I indicated I would communicate this to Dr. Alvy Bimler.  3.  She indicated that afternoon appts for RT are difficult for her to keep, wd prefer late morning if available. I spoke with Gaspar Skeeters, [facility] RT, who said 1)  that beginning 9/15 a 10:40 appt is available for the rest of her tmts, 2) she will check with patients to see if thy are willing to exchange their late morning appt slot for an early afternoon.  I communicated this to Rochel in follow-up phone call.  I spent significant time providing emotional support and encouragement during this encounter in response to [place]'s expressed frustration with the feeding tube situation, need for IVF, and RT scheduling.  Gayleen Orem, RN, BSN, [place] at [place] [phone]

## 2014-06-06 NOTE — Patient Instructions (Signed)
Dehydration, Adult Dehydration is when you lose more fluids from the body than you take in. Vital organs like the kidneys, brain, and heart cannot function without a proper amount of fluids and salt. Any loss of fluids from the body can cause dehydration.  CAUSES   Vomiting.  Diarrhea.  Excessive sweating.  Excessive urine output.  Fever. SYMPTOMS  Mild dehydration  Thirst.  Dry lips.  Slightly dry mouth. Moderate dehydration  Very dry mouth.  Sunken eyes.  Skin does not bounce back quickly when lightly pinched and released.  Dark urine and decreased urine production.  Decreased tear production.  Headache. Severe dehydration  Very dry mouth.  Extreme thirst.  Rapid, weak pulse (more than 100 beats per minute at rest).  Cold hands and feet.  Not able to sweat in spite of heat and temperature.  Rapid breathing.  Blue lips.  Confusion and lethargy.  Difficulty being awakened.  Minimal urine production.  No tears. DIAGNOSIS  Your caregiver will diagnose dehydration based on your symptoms and your exam. Blood and urine tests will help confirm the diagnosis. The diagnostic evaluation should also identify the cause of dehydration. TREATMENT  Treatment of mild or moderate dehydration can often be done at home by increasing the amount of fluids that you drink. It is best to drink small amounts of fluid more often. Drinking too much at one time can make vomiting worse. Refer to the home care instructions below. Severe dehydration needs to be treated at the hospital where you will probably be given intravenous (IV) fluids that contain water and electrolytes. HOME CARE INSTRUCTIONS   Ask your caregiver about specific rehydration instructions.  Drink enough fluids to keep your urine clear or pale yellow.  Drink small amounts frequently if you have nausea and vomiting.  Eat as you normally do.  Avoid:  Foods or drinks high in sugar.  Carbonated  drinks.  Juice.  Extremely hot or cold fluids.  Drinks with caffeine.  Fatty, greasy foods.  Alcohol.  Tobacco.  Overeating.  Gelatin desserts.  Wash your hands well to avoid spreading bacteria and viruses.  Only take over-the-counter or prescription medicines for pain, discomfort, or fever as directed by your caregiver.  Ask your caregiver if you should continue all prescribed and over-the-counter medicines.  Keep all follow-up appointments with your caregiver. SEEK MEDICAL CARE IF:  You have abdominal pain and it increases or stays in one area (localizes).  You have a rash, stiff neck, or severe headache.  You are irritable, sleepy, or difficult to awaken.  You are weak, dizzy, or extremely thirsty. SEEK IMMEDIATE MEDICAL CARE IF:   You are unable to keep fluids down or you get worse despite treatment.  You have frequent episodes of vomiting or diarrhea.  You have blood or green matter (bile) in your vomit.  You have blood in your stool or your stool looks black and tarry.  You have not urinated in 6 to 8 hours, or you have only urinated a small amount of very dark urine.  You have a fever.  You faint. MAKE SURE YOU:   Understand these instructions.  Will watch your condition.  Will get help right away if you are not doing well or get worse. Document Released: 09/22/2005 Document Revised: 12/15/2011 Document Reviewed: 05/12/2011 [employer] Patient Information 2015 [employer]. This information is not intended to replace advice given to you by your health care provider. Make sure you discuss any questions you have with your health care   provider.  

## 2014-06-06 NOTE — Progress Notes (Signed)
Pt. Escorted to Rad/Onc for XRT per Rad/Onc transport and wheelchair after fluids initiated...Marland Kitchenapprox. 3:15 pm.  Returned to Infusion area afterwards. IVF continue to infuse w/o difficulty.

## 2014-06-06 NOTE — Procedures (Signed)
Procedure: Fluoro guided replacement of percutaneous gastrostomy, after accidental displacement. Findings:  New 43F balloon retention gastrostomy in the stomach lumen. Complications: None No blood loss

## 2014-06-06 NOTE — Progress Notes (Signed)
Followup completed with patient in infusion room.  Patient is receiving IV fluids.  States her feeding tube fell out and was just replaced today.  Patient was tolerating continuous tube feedings utilizing Jevity 1.2 at 40 mL an hour over 12 hours.  Patient has been unwilling to increase tube feeding rates or time on pump. Patient is able to drink small amounts of liquid by mouth.  Weight has declined from 120.2 pounds August 19 to 117 pounds August 31.  Estimated nutrition needs: 1623-1893 calories, 81-92 g protein, 1.8 L fluid.    Jevity 1.2 at current rate of 40 mL an hour for 12 hours provides 576 calories and 27 g protein.  This is 35% of minimum calorie needs and 33% minimum protein needs.  Nutrition diagnosis: Unintended weight loss continues.  Intervention: Patient agreeable to increasing Jevity 1.2 x 5 cc a day to initial goal rate of 65 mL an hour over 12 hours.  Patient unwilling to run tube feedings over 24 hours. Will work to increase continuous feedings and add bolus feedings as tolerance increases.  Eventual goal of tube feeding to meet greater than 90% calories and protein requirements. Educated patient on the importance of hydration, and receiving IV fluids. Will hope to add liquids to patient's diet for additional calories and protein  Monitoring, evaluation, goals: Patient will tolerate tube feeding advancement for minimal weight loss.  Next visit: Will attempt followup with patient is here for IV fluids  **Disclaimer: This note was dictated with voice recognition software. Similar sounding words can inadvertently be transcribed and this note may contain transcription errors which may not have been corrected upon publication of note.**

## 2014-06-07 ENCOUNTER — Ambulatory Visit
Admission: RE | Admit: 2014-06-07 | Discharge: 2014-06-07 | Disposition: A | Discharge status: Home / Self Care | Payer: BC Managed Care – PPO | Source: Ambulatory Visit | Attending: Radiation Oncology | Admitting: Radiation Oncology

## 2014-06-07 ENCOUNTER — Encounter: Payer: Self-pay | Admitting: Nutrition

## 2014-06-07 ENCOUNTER — Other Ambulatory Visit: Payer: Self-pay

## 2014-06-07 ENCOUNTER — Ambulatory Visit: Payer: Self-pay

## 2014-06-07 DIAGNOSIS — Z510 Encounter for antineoplastic radiation therapy: Secondary | ICD-10-CM | POA: Diagnosis not present

## 2014-06-08 ENCOUNTER — Ambulatory Visit: Payer: BC Managed Care – PPO | Admitting: Nutrition

## 2014-06-08 ENCOUNTER — Ambulatory Visit
Admission: RE | Admit: 2014-06-08 | Discharge: 2014-06-08 | Disposition: A | Discharge status: Home / Self Care | Payer: BC Managed Care – PPO | Source: Ambulatory Visit | Attending: Radiation Oncology | Admitting: Radiation Oncology

## 2014-06-08 ENCOUNTER — Ambulatory Visit (HOSPITAL_BASED_OUTPATIENT_CLINIC_OR_DEPARTMENT_OTHER): Payer: BC Managed Care – PPO

## 2014-06-08 ENCOUNTER — Ambulatory Visit (HOSPITAL_BASED_OUTPATIENT_CLINIC_OR_DEPARTMENT_OTHER): Payer: BC Managed Care – PPO | Admitting: Hematology and Oncology

## 2014-06-08 ENCOUNTER — Encounter: Payer: Self-pay | Admitting: Hematology and Oncology

## 2014-06-08 VITALS — BP 123/66 | HR 75 | Temp 98.1°F | Resp 18

## 2014-06-08 DIAGNOSIS — Z930 Tracheostomy status: Secondary | ICD-10-CM

## 2014-06-08 DIAGNOSIS — C100 Malignant neoplasm of vallecula: Secondary | ICD-10-CM

## 2014-06-08 DIAGNOSIS — E43 Unspecified severe protein-calorie malnutrition: Secondary | ICD-10-CM

## 2014-06-08 DIAGNOSIS — C109 Malignant neoplasm of oropharynx, unspecified: Secondary | ICD-10-CM

## 2014-06-08 DIAGNOSIS — R070 Pain in throat: Secondary | ICD-10-CM

## 2014-06-08 DIAGNOSIS — Z931 Gastrostomy status: Secondary | ICD-10-CM

## 2014-06-08 DIAGNOSIS — Z510 Encounter for antineoplastic radiation therapy: Secondary | ICD-10-CM | POA: Diagnosis not present

## 2014-06-08 MED ORDER — HEPARIN SOD (PORK) LOCK FLUSH 100 UNIT/ML IV SOLN
500.0000 [IU] | Freq: Once | INTRAVENOUS | Status: AC | PRN
  Administered 2014-06-08: 500 [IU]
  Filled 2014-06-08: qty 5

## 2014-06-08 MED ORDER — SODIUM CHLORIDE 0.9 % IV SOLN
1000.0000 mL | Freq: Once | INTRAVENOUS | Status: AC
  Administered 2014-06-08: 1000 mL via INTRAVENOUS

## 2014-06-08 MED ORDER — SODIUM CHLORIDE 0.9 % IJ SOLN
10.0000 mL | INTRAMUSCULAR | Status: DC | PRN
  Administered 2014-06-08: 10 mL
  Filled 2014-06-08: qty 10

## 2014-06-08 NOTE — Assessment & Plan Note (Signed)
She looks  thin and mildly cachectic. I recommend Reglan before each meal and to take food by mouth as tolerated and she has no further vomiting since then.

## 2014-06-08 NOTE — Assessment & Plan Note (Signed)
Her pain appears to be well-controlled with current pain medicine.

## 2014-06-08 NOTE — Progress Notes (Signed)
Patient returned from radiation treatment.  Port was reaccessed and good blood flow was obtained after flushing port twice with 10 cc NS.  IVF bolus started.  Dr. Alvy Bimler came to assess patient.  Patient's feeding tube still had same gauze dressing around it from Tuesday and Dr. Alvy Bimler requested it be changed.  I cleansed skin around insertion site with normal saline.  Skin slightly pink/macerated.  Patient educated to change dressing at least daily and to wash skin around insertion site with soap and water and to dry it thoroughly.  I educated patient to notify MD if onset of new drainage/odor at G-tube site.  New dressing applied and patient educated how to change dressing using teach back method.  Zandra Abts [facility], [place]  06/08/2014 4:05 PM

## 2014-06-08 NOTE — Assessment & Plan Note (Signed)
The feeding tube looks fine with no infection

## 2014-06-08 NOTE — Progress Notes (Signed)
[facility] OFFICE PROGRESS NOTE  Patient Care Team: Jonathon Bellows, MD as PCP - General (Family Medicine) Heather [place], [place] as Consulting Physician (Optometry) Brooks Sailors, RN as Oncology Nurse Navigator (Oncology) Heath Lark, MD as Consulting Physician (Hematology and Oncology) Eppie Gibson, MD as Attending Physician (Radiation Oncology) Karie Mainland, RD as Dietitian (Nutrition)  SUMMARY OF ONCOLOGIC HISTORY: Oncology History   Carcinoma of vallecula epiglottica Oropharyngeal cancer   Primary site: Pharynx - Oropharynx   Staging method: AJCC 7th Edition   Clinical: Stage IVA (T3, N2c, M0) signed by Eppie Gibson, MD on 04/20/2014  3:33 PM   Summary: Stage IVA (T3, N2c, M0)       Oropharyngeal cancer   04/14/2014 Imaging CT scan of the neck showed oropharyngeal mass.   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   04/14/2014 Surgery Laryngoscopy of the lower tongue base, vallecula, and lingual surface of the epiglottis showed a fungating irregular mass   04/26/2014 Imaging PET/CT scan showed bilateral lymphadenopathy   04/27/2014 Imaging Barium swallow shows severe dysphagia.   05/18/2014 - 05/22/2014 Hospital Admission The patient was admitted to the hospital for tracheostomy, placement of port and feeding tube.   05/24/2014 -  Chemotherapy She is started on high dose cisplatin   05/24/2014 -  Radiation Therapy She is started on radiation treatment    INTERVAL HISTORY: Please see below for problem oriented charting. Her feeding tube recently felt well. It was reinserted without complication. She is able to tolerate almost to the goal feeding rate. She denies nausea or vomiting. For throat pain appears to be under control.  REVIEW OF SYSTEMS:   Constitutional: Denies fevers, chills or abnormal weight loss Eyes: Denies blurriness of vision Ears, nose, mouth, throat, and face:  Denies mucositis or sore throat Respiratory: Denies cough, dyspnea or wheezes Cardiovascular: Denies palpitation, chest discomfort or lower extremity swelling Gastrointestinal:  Denies nausea, heartburn or change in bowel habits Skin: Denies abnormal skin rashes Lymphatics: Denies new lymphadenopathy or easy bruising Neurological:Denies numbness, tingling or new weaknesses Behavioral/Psych: Mood is stable, no new changes  All other systems were reviewed with the patient and are negative.  I have reviewed the past medical history, past surgical history, social history and family history with the patient and they are unchanged from previous note.  ALLERGIES:  is allergic to penicillins.  MEDICATIONS:  Current Outpatient Prescriptions  Medication Sig Dispense Refill  . diazepam (VALIUM) 10 MG tablet Take 5 mg by mouth at bedtime.       . fentaNYL (DURAGESIC - DOSED MCG/HR) 50 MCG/HR Place 50 mcg onto the skin every 3 (three) days.      Marland Kitchen ipratropium-albuterol (DUONEB) 0.5-2.5 (3) MG/3ML SOLN Take 3 mLs by nebulization every 4 (four) hours as needed (for shortness of breath).       . lidocaine-prilocaine (EMLA) cream Apply 1 application topically as needed (for port/chemo).      . Nutritional Supplements (FEEDING SUPPLEMENT, JEVITY 1.2 CAL,) LIQD Place 40 mL/hr into feeding tube continuous.      . ondansetron (ZOFRAN ODT) 8 MG disintegrating tablet Take 1 tablet (8 mg total) by mouth every 8 (eight) hours as needed for nausea or vomiting.  20 tablet  0  . PARoxetine (PAXIL) 20 MG tablet Take 20 mg by mouth at bedtime.      . polyethylene glycol (MIRALAX / GLYCOLAX) packet Take 17 g by mouth daily  as needed for moderate constipation.      . sodium fluoride (FLUORISHIELD) 1.1 % GEL dental gel Instill one drop of fluoride per tooth space of fluoride tray. Place over teeth for 5 minutes. Remove. Spit out excess. Repeat nightly.  120 mL  prn   No current facility-administered medications for this  visit.   Facility-Administered Medications Ordered in Other Visits  Medication Dose Route Frequency Provider Last Rate Last Dose  . sodium chloride 0.9 % injection 10 mL  10 mL Intracatheter PRN Heath Lark, MD   10 mL at 06/08/14 1745    PHYSICAL EXAMINATION: ECOG PERFORMANCE STATUS: 1 - Symptomatic but completely ambulatory GENERAL:alert, no distress and comfortable. She looks thin and cachectic SKIN: skin color, texture, turgor are normal, no rashes or significant lesions EYES: normal, Conjunctiva are pink and non-injected, sclera clear OROPHARYNX:no exudate, no erythema and lips, buccal mucosa, and tongue normal  NECK: supple, IN SITU. NO SIGNS OF INFECTION. LYMPH:  no palpable lymphadenopathy in the cervical, axillary or inguinal LUNGS: clear to auscultation and percussion with normal breathing effort HEART: regular rate & rhythm and no murmurs and no lower extremity edema ABDOMEN:abdomen soft, non-tender and normal bowel sounds. The feeding tube looks normal without signs of infection. Musculoskeletal:no cyanosis of digits and no clubbing  NEURO: alert & oriented x 3 with fluent speech, no focal motor/sensory deficits  LABORATORY DATA:  I have reviewed the data as listed    Component Value Date/Time   NA 140 05/23/2014 0430   NA 140 05/04/2014 1446   K 4.2 05/23/2014 0430   K 4.2 05/04/2014 1446   CL 100 05/23/2014 0430   CO2 31 05/23/2014 0430   CO2 28 05/04/2014 1446   GLUCOSE 107* 05/23/2014 0430   GLUCOSE 91 05/04/2014 1446   BUN 13 05/23/2014 0430   BUN 13.7 05/04/2014 1446   CREATININE 0.62 05/23/2014 0430   CREATININE 0.9 05/04/2014 1446   CALCIUM 9.7 05/23/2014 0430   CALCIUM 10.0 05/04/2014 1446   PROT 5.6* 05/22/2014 0310   PROT 7.4 05/04/2014 1446   ALBUMIN 2.2* 05/22/2014 0310   ALBUMIN 3.3* 05/04/2014 1446   AST 12 05/22/2014 0310   AST 13 05/04/2014 1446   ALT 9 05/22/2014 0310   ALT 7 05/04/2014 1446   ALKPHOS 77 05/22/2014 0310   ALKPHOS 106 05/04/2014 1446   BILITOT  0.2* 05/22/2014 0310   BILITOT 0.44 05/04/2014 1446   GFRNONAA >90 05/23/2014 0430   GFRAA >90 05/23/2014 0430    No results found for this basename: SPEP, UPEP,  kappa and lambda light chains    Lab Results  Component Value Date   WBC 11.3* 05/22/2014   NEUTROABS 7.4* 05/04/2014   HGB 11.2* 05/22/2014   HCT 33.1* 05/22/2014   MCV 90.7 05/22/2014   PLT 309 05/22/2014      Chemistry      Component Value Date/Time   NA 140 05/23/2014 0430   NA 140 05/04/2014 1446   K 4.2 05/23/2014 0430   K 4.2 05/04/2014 1446   CL 100 05/23/2014 0430   CO2 31 05/23/2014 0430   CO2 28 05/04/2014 1446   BUN 13 05/23/2014 0430   BUN 13.7 05/04/2014 1446   CREATININE 0.62 05/23/2014 0430   CREATININE 0.9 05/04/2014 1446      Component Value Date/Time   CALCIUM 9.7 05/23/2014 0430   CALCIUM 10.0 05/04/2014 1446   ALKPHOS 77 05/22/2014 0310   ALKPHOS 106 05/04/2014 1446   AST 12 05/22/2014  0310   AST 13 05/04/2014 1446   ALT 9 05/22/2014 0310   ALT 7 05/04/2014 1446   BILITOT 0.2* 05/22/2014 0310   BILITOT 0.44 05/04/2014 1446      ASSESSMENT & PLAN:  Oropharyngeal cancer She tolerated treatment well apart from minor side effects. I will continue supportive care weekly.      Throat pain Her pain appears to be well-controlled with current pain medicine.    Protein-calorie malnutrition, severe She looks  thin and mildly cachectic. I recommend Reglan before each meal and to take food by mouth as tolerated and she has no further vomiting since then.    Tracheostomy status Tracheostomy site looks fine without signs of infection.      S/P percutaneous endoscopic gastrostomy (PEG) tube placement The feeding tube looks fine with no infection       No orders of the defined types were placed in this encounter.   All questions were answered. The patient knows to call the clinic with any problems, questions or concerns. No barriers to learning was detected. I spent 25 minutes counseling the  patient face to face. The total time spent in the appointment was 30 minutes and more than 50% was on counseling and review of test results     [facility], [place], MD 06/08/2014 8:38 PM

## 2014-06-08 NOTE — Progress Notes (Signed)
Nutrition followup completed with patient.  Patient is receiving IV fluids today.  Patient is being treated for oropharyngeal cancer.  Patient tolerated increase in her continuous tube feedings of Jevity 1.2, and achieved 60 mL an hour for 12 hours last night.  Patient reports she is increasing to 65/ml hr. tonight.  She agrees to receive 4 cans overnight.  This provides 1140 calories, 52.8 g protein, 764 mL free water.  Patient reports now that she is drinking flavored water, she is able to drink 2 bottles of water daily.  Estimated nutrition needs: 1623-1893 calories, 81-92 g protein, 1.8 L fluid.  Nutrition diagnosis: Unintended weight loss cannot be evaluated.  Intervention:  Patient will work to increase continuous feedings to goal rate of 120 mL over 12 hours to provide 1710 calories, 79.2 g protein, and 1146 mL free water. Patient educated and encouraged to try a milk shake or ensure or boost by mouth.  Educated patient on how to thin down product so it is tolerable. Encouraged patient to continue IV fluids as needed. Questions answered.  Teach back method used.  Monitoring, evaluation, goals: Patient has tolerated tube feeding advancements and is working to increase oral intake.  Next visit: Will followup with patient next week.  **Disclaimer: This note was dictated with voice recognition software. Similar sounding words can inadvertently be transcribed and this note may contain transcription errors which may not have been corrected upon publication of note.**

## 2014-06-08 NOTE — Assessment & Plan Note (Signed)
She tolerated treatment well apart from minor side effects. I will continue supportive care weekly.

## 2014-06-08 NOTE — Assessment & Plan Note (Signed)
Tracheostomy site looks fine without signs of infection.

## 2014-06-08 NOTE — Progress Notes (Signed)
Patient accessed.  Flushes easily with no patient discomfort.  No blood return, despite numerous efforts in different positions.  Patient due radiation therapy at 3pm and states she cannot do this with her PAC access.   Flushed port with saline and heparin.  Patient to RT and then will return to be re-accessed.

## 2014-06-08 NOTE — Patient Instructions (Signed)
Dehydration, Adult Dehydration is when you lose more fluids from the body than you take in. Vital organs like the kidneys, brain, and heart cannot function without a proper amount of fluids and salt. Any loss of fluids from the body can cause dehydration.  CAUSES   Vomiting.  Diarrhea.  Excessive sweating.  Excessive urine output.  Fever. SYMPTOMS  Mild dehydration  Thirst.  Dry lips.  Slightly dry mouth. Moderate dehydration  Very dry mouth.  Sunken eyes.  Skin does not bounce back quickly when lightly pinched and released.  Dark urine and decreased urine production.  Decreased tear production.  Headache. Severe dehydration  Very dry mouth.  Extreme thirst.  Rapid, weak pulse (more than 100 beats per minute at rest).  Cold hands and feet.  Not able to sweat in spite of heat and temperature.  Rapid breathing.  Blue lips.  Confusion and lethargy.  Difficulty being awakened.  Minimal urine production.  No tears. DIAGNOSIS  Your caregiver will diagnose dehydration based on your symptoms and your exam. Blood and urine tests will help confirm the diagnosis. The diagnostic evaluation should also identify the cause of dehydration. TREATMENT  Treatment of mild or moderate dehydration can often be done at home by increasing the amount of fluids that you drink. It is best to drink small amounts of fluid more often. Drinking too much at one time can make vomiting worse. Refer to the home care instructions below. Severe dehydration needs to be treated at the hospital where you will probably be given intravenous (IV) fluids that contain water and electrolytes. HOME CARE INSTRUCTIONS   Ask your caregiver about specific rehydration instructions.  Drink enough fluids to keep your urine clear or pale yellow.  Drink small amounts frequently if you have nausea and vomiting.  Eat as you normally do.  Avoid:  Foods or drinks high in sugar.  Carbonated  drinks.  Juice.  Extremely hot or cold fluids.  Drinks with caffeine.  Fatty, greasy foods.  Alcohol.  Tobacco.  Overeating.  Gelatin desserts.  Wash your hands well to avoid spreading bacteria and viruses.  Only take over-the-counter or prescription medicines for pain, discomfort, or fever as directed by your caregiver.  Ask your caregiver if you should continue all prescribed and over-the-counter medicines.  Keep all follow-up appointments with your caregiver. SEEK MEDICAL CARE IF:  You have abdominal pain and it increases or stays in one area (localizes).  You have a rash, stiff neck, or severe headache.  You are irritable, sleepy, or difficult to awaken.  You are weak, dizzy, or extremely thirsty. SEEK IMMEDIATE MEDICAL CARE IF:   You are unable to keep fluids down or you get worse despite treatment.  You have frequent episodes of vomiting or diarrhea.  You have blood or green matter (bile) in your vomit.  You have blood in your stool or your stool looks black and tarry.  You have not urinated in 6 to 8 hours, or you have only urinated a small amount of very dark urine.  You have a fever.  You faint. MAKE SURE YOU:   Understand these instructions.  Will watch your condition.  Will get help right away if you are not doing well or get worse. Document Released: 09/22/2005 Document Revised: 12/15/2011 Document Reviewed: 05/12/2011 [employer] Patient Information 2015 [employer]. This information is not intended to replace advice given to you by your health care provider. Make sure you discuss any questions you have with your health care   provider.  

## 2014-06-09 ENCOUNTER — Ambulatory Visit: Payer: Self-pay

## 2014-06-09 ENCOUNTER — Ambulatory Visit
Admission: RE | Admit: 2014-06-09 | Discharge: 2014-06-09 | Disposition: A | Discharge status: Home / Self Care | Payer: BC Managed Care – PPO | Source: Ambulatory Visit | Attending: Radiation Oncology | Admitting: Radiation Oncology

## 2014-06-09 ENCOUNTER — Telehealth: Payer: Self-pay | Admitting: Hematology and Oncology

## 2014-06-09 ENCOUNTER — Telehealth: Payer: Self-pay | Admitting: *Deleted

## 2014-06-09 ENCOUNTER — Encounter: Payer: Self-pay | Admitting: *Deleted

## 2014-06-09 DIAGNOSIS — Z510 Encounter for antineoplastic radiation therapy: Secondary | ICD-10-CM | POA: Diagnosis not present

## 2014-06-09 NOTE — Telephone Encounter (Signed)
lvm for pt regarding to Sept appts...advised pt to pick up sched on Monday..sed added tx.

## 2014-06-09 NOTE — Progress Notes (Signed)
Met with Stacey Carroll and her friend Stacey Carroll prior to RT to check on well being.  She reported that she has had no problems with new PEG, is currently on a continuous feed rate of 70 cc/hr and is not experiencing N&V at this rate.  She said her goal is 120 cc/hr per her visit with Dory Peru, RD.  Subjectively, Stacey Carroll expressed optimism and was in good spirits.  I provided her split gauze and face masks.  Initiating navigation as L2 patient (treatments established) with this encounter.  Gayleen Orem, RN, BSN, [place] at [place] [phone]

## 2014-06-09 NOTE — Telephone Encounter (Signed)
Per scheduler, asked to schedule appt 7hr treatment on 9/10. No available on 9/10 advised scheduler

## 2014-06-10 ENCOUNTER — Ambulatory Visit (HOSPITAL_BASED_OUTPATIENT_CLINIC_OR_DEPARTMENT_OTHER): Payer: BC Managed Care – PPO

## 2014-06-10 VITALS — BP 130/67 | HR 76 | Temp 98.1°F | Resp 20

## 2014-06-10 DIAGNOSIS — E43 Unspecified severe protein-calorie malnutrition: Secondary | ICD-10-CM

## 2014-06-10 DIAGNOSIS — C100 Malignant neoplasm of vallecula: Secondary | ICD-10-CM

## 2014-06-10 MED ORDER — HEPARIN SOD (PORK) LOCK FLUSH 100 UNIT/ML IV SOLN
500.0000 [IU] | Freq: Once | INTRAVENOUS | Status: AC
  Administered 2014-06-10: 500 [IU] via INTRAVENOUS
  Filled 2014-06-10: qty 5

## 2014-06-10 MED ORDER — ONDANSETRON 8 MG/50ML IVPB (CHCC)
8.0000 mg | Freq: Every day | INTRAVENOUS | Status: DC | PRN
  Administered 2014-06-10: 8 mg via INTRAVENOUS

## 2014-06-10 MED ORDER — SODIUM CHLORIDE 0.9 % IV SOLN
Freq: Once | INTRAVENOUS | Status: AC
  Administered 2014-06-10: 08:00:00 via INTRAVENOUS

## 2014-06-10 MED ORDER — SODIUM CHLORIDE 0.9 % IJ SOLN
10.0000 mL | INTRAMUSCULAR | Status: DC | PRN
  Administered 2014-06-10: 10 mL via INTRAVENOUS
  Filled 2014-06-10: qty 10

## 2014-06-10 NOTE — Patient Instructions (Signed)
Dehydration, Adult Dehydration is when you lose more fluids from the body than you take in. Vital organs like the kidneys, brain, and heart cannot function without a proper amount of fluids and salt. Any loss of fluids from the body can cause dehydration.  CAUSES   Vomiting.  Diarrhea.  Excessive sweating.  Excessive urine output.  Fever. SYMPTOMS  Mild dehydration  Thirst.  Dry lips.  Slightly dry mouth. Moderate dehydration  Very dry mouth.  Sunken eyes.  Skin does not bounce back quickly when lightly pinched and released.  Dark urine and decreased urine production.  Decreased tear production.  Headache. Severe dehydration  Very dry mouth.  Extreme thirst.  Rapid, weak pulse (more than 100 beats per minute at rest).  Cold hands and feet.  Not able to sweat in spite of heat and temperature.  Rapid breathing.  Blue lips.  Confusion and lethargy.  Difficulty being awakened.  Minimal urine production.  No tears. DIAGNOSIS  Your caregiver will diagnose dehydration based on your symptoms and your exam. Blood and urine tests will help confirm the diagnosis. The diagnostic evaluation should also identify the cause of dehydration. TREATMENT  Treatment of mild or moderate dehydration can often be done at home by increasing the amount of fluids that you drink. It is best to drink small amounts of fluid more often. Drinking too much at one time can make vomiting worse. Refer to the home care instructions below. Severe dehydration needs to be treated at the hospital where you will probably be given intravenous (IV) fluids that contain water and electrolytes. HOME CARE INSTRUCTIONS   Ask your caregiver about specific rehydration instructions.  Drink enough fluids to keep your urine clear or pale yellow.  Drink small amounts frequently if you have nausea and vomiting.  Eat as you normally do.  Avoid:  Foods or drinks high in sugar.  Carbonated  drinks.  Juice.  Extremely hot or cold fluids.  Drinks with caffeine.  Fatty, greasy foods.  Alcohol.  Tobacco.  Overeating.  Gelatin desserts.  Wash your hands well to avoid spreading bacteria and viruses.  Only take over-the-counter or prescription medicines for pain, discomfort, or fever as directed by your caregiver.  Ask your caregiver if you should continue all prescribed and over-the-counter medicines.  Keep all follow-up appointments with your caregiver. SEEK MEDICAL CARE IF:  You have abdominal pain and it increases or stays in one area (localizes).  You have a rash, stiff neck, or severe headache.  You are irritable, sleepy, or difficult to awaken.  You are weak, dizzy, or extremely thirsty. SEEK IMMEDIATE MEDICAL CARE IF:   You are unable to keep fluids down or you get worse despite treatment.  You have frequent episodes of vomiting or diarrhea.  You have blood or green matter (bile) in your vomit.  You have blood in your stool or your stool looks black and tarry.  You have not urinated in 6 to 8 hours, or you have only urinated a small amount of very dark urine.  You have a fever.  You faint. MAKE SURE YOU:   Understand these instructions.  Will watch your condition.  Will get help right away if you are not doing well or get worse. Document Released: 09/22/2005 Document Revised: 12/15/2011 Document Reviewed: 05/12/2011 [employer] Patient Information 2015 [employer]. This information is not intended to replace advice given to you by your health care provider. Make sure you discuss any questions you have with your health care   provider.  

## 2014-06-13 ENCOUNTER — Other Ambulatory Visit: Payer: Self-pay

## 2014-06-13 ENCOUNTER — Ambulatory Visit
Admission: RE | Admit: 2014-06-13 | Discharge: 2014-06-13 | Disposition: A | Discharge status: Home / Self Care | Payer: BC Managed Care – PPO | Source: Ambulatory Visit | Attending: Radiation Oncology | Admitting: Radiation Oncology

## 2014-06-13 DIAGNOSIS — Z510 Encounter for antineoplastic radiation therapy: Secondary | ICD-10-CM | POA: Diagnosis not present

## 2014-06-14 ENCOUNTER — Ambulatory Visit
Admission: RE | Admit: 2014-06-14 | Discharge: 2014-06-14 | Disposition: A | Discharge status: Home / Self Care | Payer: BC Managed Care – PPO | Source: Ambulatory Visit | Attending: Radiation Oncology | Admitting: Radiation Oncology

## 2014-06-14 ENCOUNTER — Other Ambulatory Visit (HOSPITAL_BASED_OUTPATIENT_CLINIC_OR_DEPARTMENT_OTHER): Payer: BC Managed Care – PPO

## 2014-06-14 ENCOUNTER — Encounter: Payer: Self-pay | Admitting: Hematology and Oncology

## 2014-06-14 ENCOUNTER — Telehealth: Payer: Self-pay | Admitting: Hematology and Oncology

## 2014-06-14 ENCOUNTER — Ambulatory Visit: Payer: BC Managed Care – PPO | Admitting: Radiation Oncology

## 2014-06-14 ENCOUNTER — Encounter: Payer: Self-pay | Admitting: Radiation Oncology

## 2014-06-14 ENCOUNTER — Ambulatory Visit (HOSPITAL_BASED_OUTPATIENT_CLINIC_OR_DEPARTMENT_OTHER): Payer: BC Managed Care – PPO | Admitting: Hematology and Oncology

## 2014-06-14 VITALS — BP 131/76 | HR 64 | Temp 97.8°F | Resp 16 | Ht 65.0 in | Wt 116.8 lb

## 2014-06-14 VITALS — BP 131/76 | HR 64 | Temp 97.8°F | Resp 16 | Wt 116.9 lb

## 2014-06-14 DIAGNOSIS — C100 Malignant neoplasm of vallecula: Secondary | ICD-10-CM

## 2014-06-14 DIAGNOSIS — E43 Unspecified severe protein-calorie malnutrition: Secondary | ICD-10-CM

## 2014-06-14 DIAGNOSIS — R070 Pain in throat: Secondary | ICD-10-CM

## 2014-06-14 DIAGNOSIS — Z23 Encounter for immunization: Secondary | ICD-10-CM

## 2014-06-14 DIAGNOSIS — D630 Anemia in neoplastic disease: Secondary | ICD-10-CM | POA: Insufficient documentation

## 2014-06-14 DIAGNOSIS — Z510 Encounter for antineoplastic radiation therapy: Secondary | ICD-10-CM | POA: Diagnosis not present

## 2014-06-14 DIAGNOSIS — Z930 Tracheostomy status: Secondary | ICD-10-CM

## 2014-06-14 DIAGNOSIS — Z299 Encounter for prophylactic measures, unspecified: Secondary | ICD-10-CM | POA: Insufficient documentation

## 2014-06-14 DIAGNOSIS — Z931 Gastrostomy status: Secondary | ICD-10-CM

## 2014-06-14 DIAGNOSIS — C109 Malignant neoplasm of oropharynx, unspecified: Secondary | ICD-10-CM

## 2014-06-14 LAB — COMPREHENSIVE METABOLIC PANEL (CC13)
ALBUMIN: 3.1 g/dL — AB (ref 3.5–5.0)
ALK PHOS: 82 U/L (ref 40–150)
ALT: 8 U/L (ref 0–55)
AST: 13 U/L (ref 5–34)
Anion Gap: 8 mEq/L (ref 3–11)
BUN: 15.6 mg/dL (ref 7.0–26.0)
CHLORIDE: 101 meq/L (ref 98–109)
CO2: 28 mEq/L (ref 22–29)
Calcium: 9.7 mg/dL (ref 8.4–10.4)
Creatinine: 0.8 mg/dL (ref 0.6–1.1)
Glucose: 93 mg/dl (ref 70–140)
POTASSIUM: 5.3 meq/L — AB (ref 3.5–5.1)
SODIUM: 137 meq/L (ref 136–145)
TOTAL PROTEIN: 7.4 g/dL (ref 6.4–8.3)
Total Bilirubin: <0.2 mg/dL (ref 0.20–1.20)

## 2014-06-14 LAB — CBC WITH DIFFERENTIAL/PLATELET
BASO%: 0.4 % (ref 0.0–2.0)
Basophils Absolute: 0 10*3/uL (ref 0.0–0.1)
EOS%: 1.6 % (ref 0.0–7.0)
Eosinophils Absolute: 0.1 10*3/uL (ref 0.0–0.5)
HCT: 33.7 % — ABNORMAL LOW (ref 34.8–46.6)
HGB: 11.1 g/dL — ABNORMAL LOW (ref 11.6–15.9)
LYMPH#: 1.2 10*3/uL (ref 0.9–3.3)
LYMPH%: 23.7 % (ref 14.0–49.7)
MCH: 30.2 pg (ref 25.1–34.0)
MCHC: 32.9 g/dL (ref 31.5–36.0)
MCV: 91.8 fL (ref 79.5–101.0)
MONO#: 0.9 10*3/uL (ref 0.1–0.9)
MONO%: 17.6 % — ABNORMAL HIGH (ref 0.0–14.0)
NEUT%: 56.7 % (ref 38.4–76.8)
NEUTROS ABS: 2.8 10*3/uL (ref 1.5–6.5)
Platelets: 375 10*3/uL (ref 145–400)
RBC: 3.67 10*6/uL — AB (ref 3.70–5.45)
RDW: 14.7 % — AB (ref 11.2–14.5)
WBC: 4.9 10*3/uL (ref 3.9–10.3)

## 2014-06-14 LAB — MAGNESIUM (CC13): Magnesium: 2.4 mg/dl (ref 1.5–2.5)

## 2014-06-14 MED ORDER — INFLUENZA VAC SPLIT QUAD 0.5 ML IM SUSY
0.5000 mL | PREFILLED_SYRINGE | Freq: Once | INTRAMUSCULAR | Status: AC
  Administered 2014-06-14: 0.5 mL via INTRAMUSCULAR
  Filled 2014-06-14: qty 0.5

## 2014-06-14 NOTE — Assessment & Plan Note (Signed)
We discussed the importance of preventive care and reviewed the vaccination programs. She does not have any prior allergic reactions to influenza vaccination. She agrees to proceed with influenza vaccination today and we will administer it today at the clinic.  

## 2014-06-14 NOTE — Progress Notes (Signed)
Denies headache, dizziness, nausea or vomiting. Reports she is able to drink water and broth by mouth with discomfort only initially. Reports nutrition is instilled via her PEG tube but, unable to verbalize amount. Weight and vitals stable. Denies pain. First chemo on 8/19 with the next planned for the end of this week or the first of the next.

## 2014-06-14 NOTE — Assessment & Plan Note (Signed)
Tracheostomy site looks fine without signs of infection.

## 2014-06-14 NOTE — Progress Notes (Signed)
   Weekly Management Note:  outpatient    ICD-9-CM ICD-10-CM  1. 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  CBCT/MVCT images/Port film x-rays were reviewed.  The chart was checked. Pain well contrlled with Duragesic 50mcg. Minimal complaints. No dizziness, nausea or vomiting. Reports she is able to drink water and broth by mouth with discomfort only initially. Reports nutrition is instilled via her PEG tube but, unable to verbalize amount.   First chemo infusion was on 8/19 with the next planned for the end of this week or the first of the next. She is not using biafine - "I forget.".   Physical Findings:  weight is 116 lb 14.4 oz (53.025 kg). Her oral temperature is 97.8 F (36.6 C). Her blood pressure is 131/76 and her pulse is 64. Her respiration is 16 and oxygen saturation is 100%.  oropharynx: erythema, no thrush. Skin over neck intact.  CBC    Component Value Date/Time   WBC 4.9 06/14/2014 1227   WBC 11.3* 05/22/2014 0310   RBC 3.67* 06/14/2014 1227   RBC 3.65* 05/22/2014 0310   HGB 11.1* 06/14/2014 1227   HGB 11.2* 05/22/2014 0310   HCT 33.7* 06/14/2014 1227   HCT 33.1* 05/22/2014 0310   PLT 375 06/14/2014 1227   PLT 309 05/22/2014 0310   MCV 91.8 06/14/2014 1227   MCV 90.7 05/22/2014 0310   MCH 30.2 06/14/2014 1227   MCH 30.7 05/22/2014 0310   MCHC 32.9 06/14/2014 1227   MCHC 33.8 05/22/2014 0310   RDW 14.7* 06/14/2014 1227   RDW 13.4 05/22/2014 0310   LYMPHSABS 1.2 06/14/2014 1227   LYMPHSABS 2.9 04/14/2014 1020   MONOABS 0.9 06/14/2014 1227   MONOABS 1.6* 04/14/2014 1020   EOSABS 0.1 06/14/2014 1227   EOSABS 0.2 04/14/2014 1020   BASOSABS 0.0 06/14/2014 1227   BASOSABS 0.1 04/14/2014 1020     CMP     Component Value Date/Time   NA 137 06/14/2014 1227   NA 140 05/23/2014 0430   K 5.3* 06/14/2014 1227   K 4.2 05/23/2014 0430   CL 100 05/23/2014 0430   CO2 28 06/14/2014 1227     CO2 31 05/23/2014 0430   GLUCOSE 93 06/14/2014 1227   GLUCOSE 107* 05/23/2014 0430   BUN 15.6 06/14/2014 1227   BUN 13 05/23/2014 0430   CREATININE 0.8 06/14/2014 1227   CREATININE 0.62 05/23/2014 0430   CALCIUM 9.7 06/14/2014 1227   CALCIUM 9.7 05/23/2014 0430   PROT 7.4 06/14/2014 1227   PROT 5.6* 05/22/2014 0310   ALBUMIN 3.1* 06/14/2014 1227   ALBUMIN 2.2* 05/22/2014 0310   AST 13 06/14/2014 1227   AST 12 05/22/2014 0310   ALT 8 06/14/2014 1227   ALT 9 05/22/2014 0310   ALKPHOS 82 06/14/2014 1227   ALKPHOS 77 05/22/2014 0310   BILITOT <0.20 06/14/2014 1227   BILITOT 0.2* 05/22/2014 0310   GFRNONAA >90 05/23/2014 0430   GFRAA >90 05/23/2014 0430     Impression:  The patient is tolerating radiotherapy.   Plan:  Continue radiotherapy as planned. Instructed her to try applying Biafine after daily RT (she may remember if she keeps it in her bag).  -----------------------------------  Eppie Gibson, MD

## 2014-06-14 NOTE — Progress Notes (Signed)
[facility] OFFICE PROGRESS NOTE  Patient Care Team: Jonathon Bellows, MD as PCP - General (Family Medicine) Heather [place], [place] as Consulting Physician (Optometry) Brooks Sailors, RN as Oncology Nurse Navigator (Oncology) Heath Lark, MD as Consulting Physician (Hematology and Oncology) Eppie Gibson, MD as Attending Physician (Radiation Oncology) Karie Mainland, RD as Dietitian (Nutrition)  SUMMARY OF ONCOLOGIC HISTORY: Oncology History   Carcinoma of vallecula epiglottica Oropharyngeal cancer   Primary site: Pharynx - Oropharynx   Staging method: AJCC 7th Edition   Clinical: Stage IVA (T3, N2c, M0) signed by Eppie Gibson, MD on 04/20/2014  3:33 PM   Summary: Stage IVA (T3, N2c, M0)       Oropharyngeal cancer   04/14/2014 Imaging CT scan of the neck showed oropharyngeal mass.   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   04/14/2014 Surgery Laryngoscopy of the lower tongue base, vallecula, and lingual surface of the epiglottis showed a fungating irregular mass   04/26/2014 Imaging PET/CT scan showed bilateral lymphadenopathy   04/27/2014 Imaging Barium swallow shows severe dysphagia.   05/18/2014 - 05/22/2014 Hospital Admission The patient was admitted to the hospital for tracheostomy, placement of port and feeding tube.   05/24/2014 -  Chemotherapy She is started on high dose cisplatin   05/24/2014 -  Radiation Therapy She is started on radiation treatment    INTERVAL HISTORY: Please see below for problem oriented charting. She is seen prior to cycle 2 of treatment.  REVIEW OF SYSTEMS:   Constitutional: Denies fevers, chills or abnormal weight loss Eyes: Denies blurriness of vision Ears, nose, mouth, throat, and face: Denies mucositis or sore throat Respiratory: Denies cough, dyspnea or wheezes Cardiovascular: Denies palpitation, chest discomfort or lower extremity  swelling Gastrointestinal:  Denies nausea, heartburn or change in bowel habits Skin: Denies abnormal skin rashes Lymphatics: Denies new lymphadenopathy or easy bruising Neurological:Denies numbness, tingling or new weaknesses Behavioral/Psych: Mood is stable, no new changes  All other systems were reviewed with the patient and are negative.  I have reviewed the past medical history, past surgical history, social history and family history with the patient and they are unchanged from previous note.  ALLERGIES:  is allergic to penicillins.  MEDICATIONS:  Current Outpatient Prescriptions  Medication Sig Dispense Refill  . diazepam (VALIUM) 10 MG tablet Take 5 mg by mouth at bedtime.       . fentaNYL (DURAGESIC - DOSED MCG/HR) 50 MCG/HR Place 50 mcg onto the skin every 3 (three) days.      Marland Kitchen ipratropium-albuterol (DUONEB) 0.5-2.5 (3) MG/3ML SOLN Take 3 mLs by nebulization every 4 (four) hours as needed (for shortness of breath).       . lidocaine-prilocaine (EMLA) cream Apply 1 application topically as needed (for port/chemo).      Marland Kitchen metoCLOPramide (REGLAN) 10 MG tablet       . Morphine Sulfate (MORPHINE CONCENTRATE) 10 mg / 0.5 ml concentrated solution       . Nutritional Supplements (FEEDING SUPPLEMENT, JEVITY 1.2 CAL,) LIQD Place 40 mL/hr into feeding tube continuous.      . ondansetron (ZOFRAN ODT) 8 MG disintegrating tablet Take 1 tablet (8 mg total) by mouth every 8 (eight) hours as needed for nausea or vomiting.  20 tablet  0  . ondansetron (ZOFRAN) 8 MG tablet       . PARoxetine (PAXIL) 20 MG tablet Take 20 mg by mouth at bedtime.      Marland Kitchen  PHENADOZ 25 MG suppository       . polyethylene glycol (MIRALAX / GLYCOLAX) packet Take 17 g by mouth daily as needed for moderate constipation.      . sodium fluoride (FLUORISHIELD) 1.1 % GEL dental gel Instill one drop of fluoride per tooth space of fluoride tray. Place over teeth for 5 minutes. Remove. Spit out excess. Repeat nightly.  120 mL  prn    No current facility-administered medications for this visit.    PHYSICAL EXAMINATION: ECOG PERFORMANCE STATUS: 1 - Symptomatic but completely ambulatory  Filed Vitals:   06/14/14 1417  BP: 131/76  Pulse: 64  Temp: 97.8 F (36.6 C)  Resp: 16   Filed Weights   06/14/14 1417  Weight: 116 lb 12.8 oz (52.98 kg)    GENERAL:alert, no distress and comfortable. She looks thin and mildly cachectic SKIN: skin color, texture, turgor are normal, no rashes or significant lesions EYES: normal, Conjunctiva are pink and non-injected, sclera clear OROPHARYNX:no exudate, no erythema and lips, buccal mucosa, and tongue normal  NECK: supple, tracheostomy looks okay. LYMPH:  no palpable lymphadenopathy in the cervical, axillary or inguinal LUNGS: clear to auscultation and percussion with normal breathing effort HEART: regular rate & rhythm and no murmurs and no lower extremity edema ABDOMEN:abdomen soft, non-tender and normal bowel sounds. Feeding tube looks okay with no signs of infection. Musculoskeletal:no cyanosis of digits and no clubbing  NEURO: alert & oriented x 3 with fluent speech, no focal motor/sensory deficits  LABORATORY DATA:  I have reviewed the data as listed    Component Value Date/Time   NA 137 06/14/2014 1227   NA 140 05/23/2014 0430   K 5.3* 06/14/2014 1227   K 4.2 05/23/2014 0430   CL 100 05/23/2014 0430   CO2 28 06/14/2014 1227   CO2 31 05/23/2014 0430   GLUCOSE 93 06/14/2014 1227   GLUCOSE 107* 05/23/2014 0430   BUN 15.6 06/14/2014 1227   BUN 13 05/23/2014 0430   CREATININE 0.8 06/14/2014 1227   CREATININE 0.62 05/23/2014 0430   CALCIUM 9.7 06/14/2014 1227   CALCIUM 9.7 05/23/2014 0430   PROT 7.4 06/14/2014 1227   PROT 5.6* 05/22/2014 0310   ALBUMIN 3.1* 06/14/2014 1227   ALBUMIN 2.2* 05/22/2014 0310   AST 13 06/14/2014 1227   AST 12 05/22/2014 0310   ALT 8 06/14/2014 1227   ALT 9 05/22/2014 0310   ALKPHOS 82 06/14/2014 1227   ALKPHOS 77 05/22/2014 0310   BILITOT <0.20 06/14/2014 1227    BILITOT 0.2* 05/22/2014 0310   GFRNONAA >90 05/23/2014 0430   GFRAA >90 05/23/2014 0430    No results found for this basename: SPEP, UPEP,  kappa and lambda light chains    Lab Results  Component Value Date   WBC 4.9 06/14/2014   NEUTROABS 2.8 06/14/2014   HGB 11.1* 06/14/2014   HCT 33.7* 06/14/2014   MCV 91.8 06/14/2014   PLT 375 06/14/2014      Chemistry      Component Value Date/Time   NA 137 06/14/2014 1227   NA 140 05/23/2014 0430   K 5.3* 06/14/2014 1227   K 4.2 05/23/2014 0430   CL 100 05/23/2014 0430   CO2 28 06/14/2014 1227   CO2 31 05/23/2014 0430   BUN 15.6 06/14/2014 1227   BUN 13 05/23/2014 0430   CREATININE 0.8 06/14/2014 1227   CREATININE 0.62 05/23/2014 0430      Component Value Date/Time   CALCIUM 9.7 06/14/2014 1227   CALCIUM  9.7 05/23/2014 0430   ALKPHOS 82 06/14/2014 1227   ALKPHOS 77 05/22/2014 0310   AST 13 06/14/2014 1227   AST 12 05/22/2014 0310   ALT 8 06/14/2014 1227   ALT 9 05/22/2014 0310   BILITOT <0.20 06/14/2014 1227   BILITOT 0.2* 05/22/2014 0310     ASSESSMENT & PLAN:  Oropharyngeal cancer She tolerated treatment well apart from minor side effects. I will continue supportive care weekly.        Throat pain Her pain appears to be well-controlled with current pain medicine.      Protein-calorie malnutrition, severe She looks  thin and mildly cachectic. Overall, her several albumin is improving and she is tolerating her feeding well without any further nausea or vomiting for the past week. I recommend Reglan before each meal and to take food by mouth as tolerated.      S/P percutaneous endoscopic gastrostomy (PEG) tube placement The feeding tube looks fine with no infection        Tracheostomy status Tracheostomy site looks fine without signs of infection.        Preventive measure We discussed the importance of preventive care and reviewed the vaccination programs. She does not have any prior allergic reactions to influenza vaccination. She  agrees to proceed with influenza vaccination today and we will administer it today at the clinic.   Anemia in neoplastic disease This is likely due to recent treatment. The patient denies recent history of bleeding such as epistaxis, hematuria or hematochezia. She is asymptomatic from the anemia. I will observe for now.  She does not require transfusion now. I will continue the chemotherapy at current dose without dosage adjustment.  If the anemia gets progressive worse in the future, I might have to delay her treatment or adjust the chemotherapy dose.       All questions were answered. The patient knows to call the clinic with any problems, questions or concerns. No barriers to learning was detected. I spent 25 minutes counseling the patient face to face. The total time spent in the appointment was 30 minutes and more than 50% was on counseling and review of test results     [facility], [place], MD 06/14/2014 8:22 PM

## 2014-06-14 NOTE — Assessment & Plan Note (Signed)

## 2014-06-14 NOTE — Assessment & Plan Note (Signed)
She tolerated treatment well apart from minor side effects. I will continue supportive care weekly.

## 2014-06-14 NOTE — Assessment & Plan Note (Signed)
The feeding tube looks fine with no infection

## 2014-06-14 NOTE — Telephone Encounter (Signed)
s.w. pt and confirmed todays appt....pt ok and aware °

## 2014-06-14 NOTE — Assessment & Plan Note (Signed)
She looks  thin and mildly cachectic. Overall, her several albumin is improving and she is tolerating her feeding well without any further nausea or vomiting for the past week. I recommend Reglan before each meal and to take food by mouth as tolerated.

## 2014-06-14 NOTE — Assessment & Plan Note (Signed)
Her pain appears to be well-controlled with current pain medicine.

## 2014-06-15 ENCOUNTER — Ambulatory Visit
Admission: RE | Admit: 2014-06-15 | Discharge: 2014-06-15 | Disposition: A | Discharge status: Home / Self Care | Payer: BC Managed Care – PPO | Source: Ambulatory Visit | Attending: Radiation Oncology | Admitting: Radiation Oncology

## 2014-06-15 DIAGNOSIS — Z510 Encounter for antineoplastic radiation therapy: Secondary | ICD-10-CM | POA: Diagnosis not present

## 2014-06-16 ENCOUNTER — Ambulatory Visit
Admission: RE | Admit: 2014-06-16 | Discharge: 2014-06-16 | Disposition: A | Discharge status: Home / Self Care | Payer: BC Managed Care – PPO | Source: Ambulatory Visit | Attending: Radiation Oncology | Admitting: Radiation Oncology

## 2014-06-16 ENCOUNTER — Telehealth: Payer: Self-pay | Admitting: Hematology and Oncology

## 2014-06-16 ENCOUNTER — Ambulatory Visit (HOSPITAL_BASED_OUTPATIENT_CLINIC_OR_DEPARTMENT_OTHER): Payer: BC Managed Care – PPO

## 2014-06-16 VITALS — BP 128/87 | HR 80 | Temp 98.4°F | Resp 16

## 2014-06-16 DIAGNOSIS — Z5111 Encounter for antineoplastic chemotherapy: Secondary | ICD-10-CM

## 2014-06-16 DIAGNOSIS — C100 Malignant neoplasm of vallecula: Secondary | ICD-10-CM

## 2014-06-16 DIAGNOSIS — Z510 Encounter for antineoplastic radiation therapy: Secondary | ICD-10-CM | POA: Diagnosis not present

## 2014-06-16 DIAGNOSIS — C109 Malignant neoplasm of oropharynx, unspecified: Secondary | ICD-10-CM

## 2014-06-16 LAB — COMPREHENSIVE METABOLIC PANEL (CC13)
ALT: 6 U/L (ref 0–55)
ANION GAP: 9 meq/L (ref 3–11)
AST: 13 U/L (ref 5–34)
Albumin: 3 g/dL — ABNORMAL LOW (ref 3.5–5.0)
Alkaline Phosphatase: 84 U/L (ref 40–150)
BUN: 17.8 mg/dL (ref 7.0–26.0)
CO2: 27 meq/L (ref 22–29)
CREATININE: 0.7 mg/dL (ref 0.6–1.1)
Calcium: 9.2 mg/dL (ref 8.4–10.4)
Chloride: 101 mEq/L (ref 98–109)
Glucose: 99 mg/dl (ref 70–140)
Potassium: 4.7 mEq/L (ref 3.5–5.1)
SODIUM: 137 meq/L (ref 136–145)
TOTAL PROTEIN: 6.9 g/dL (ref 6.4–8.3)
Total Bilirubin: <0.2 mg/dL (ref 0.20–1.20)

## 2014-06-16 MED ORDER — SODIUM CHLORIDE 0.9 % IV SOLN
100.0000 mg/m2 | Freq: Once | INTRAVENOUS | Status: AC
  Administered 2014-06-16: 160 mg via INTRAVENOUS
  Filled 2014-06-16: qty 160

## 2014-06-16 MED ORDER — SODIUM CHLORIDE 0.9 % IV SOLN
Freq: Once | INTRAVENOUS | Status: AC
  Administered 2014-06-16: 11:00:00 via INTRAVENOUS

## 2014-06-16 MED ORDER — DEXAMETHASONE SODIUM PHOSPHATE 20 MG/5ML IJ SOLN
INTRAMUSCULAR | Status: AC
  Filled 2014-06-16: qty 5

## 2014-06-16 MED ORDER — FOSAPREPITANT DIMEGLUMINE INJECTION 150 MG
150.0000 mg | Freq: Once | INTRAVENOUS | Status: AC
  Administered 2014-06-16: 150 mg via INTRAVENOUS
  Filled 2014-06-16: qty 5

## 2014-06-16 MED ORDER — HEPARIN SOD (PORK) LOCK FLUSH 100 UNIT/ML IV SOLN
500.0000 [IU] | Freq: Once | INTRAVENOUS | Status: AC | PRN
  Administered 2014-06-16: 500 [IU]
  Filled 2014-06-16: qty 5

## 2014-06-16 MED ORDER — MANNITOL 25 % IV SOLN
Freq: Once | INTRAVENOUS | Status: AC
  Administered 2014-06-16: 12:00:00 via INTRAVENOUS
  Filled 2014-06-16: qty 10

## 2014-06-16 MED ORDER — PALONOSETRON HCL INJECTION 0.25 MG/5ML
0.2500 mg | Freq: Once | INTRAVENOUS | Status: AC
  Administered 2014-06-16: 0.25 mg via INTRAVENOUS

## 2014-06-16 MED ORDER — DEXAMETHASONE 4 MG PO TABS: ORAL_TABLET | ORAL | Status: DC

## 2014-06-16 MED ORDER — SODIUM CHLORIDE 0.9 % IJ SOLN
10.0000 mL | INTRAMUSCULAR | Status: DC | PRN
  Administered 2014-06-16: 10 mL
  Filled 2014-06-16: qty 10

## 2014-06-16 MED ORDER — DEXAMETHASONE SODIUM PHOSPHATE 20 MG/5ML IJ SOLN
12.0000 mg | Freq: Once | INTRAMUSCULAR | Status: AC
  Administered 2014-06-16: 12 mg via INTRAVENOUS

## 2014-06-16 MED ORDER — PALONOSETRON HCL INJECTION 0.25 MG/5ML
INTRAVENOUS | Status: AC
  Filled 2014-06-16: qty 5

## 2014-06-16 MED ORDER — PROCHLORPERAZINE MALEATE 10 MG PO TABS: 10.0000 mg | ORAL_TABLET | Freq: Four times a day (QID) | ORAL | Status: DC | PRN

## 2014-06-16 NOTE — Progress Notes (Signed)
Potassium value from labs 06/14/14 was 5.3. Dr. Alvy Bimler not here today so per Dr. Benay Spice (on-call MD) redraw CMET and wait for results prior to Cisplatin pre-hydration fluids.  Potassium back from today at 4.7. Per Ned Card, NP - okay to proceed with pre-hydration fluids that contain potassium.   Upon discharging patient, this RN was reviewing at home nausea medications with patient. Since pt had aloxi, zofran would be ineffective for 3 days and she cannot take phenergan or compazine with her reglan. Consulted with pharamcy and they recommended decadron tablets. Selena Lesser, NP ordered decadron for 3 days at home and then as needed afterwards. Pt instructed how to take medication.

## 2014-06-16 NOTE — Telephone Encounter (Signed)
lvm for pt regarding to Sept appt.... °

## 2014-06-16 NOTE — Patient Instructions (Signed)
[facility] Discharge Instructions for Patients Receiving Chemotherapy  Today you received the following chemotherapy agents Cisplatin.  To help prevent nausea and vomiting after your treatment, we encourage you to take your nausea medication as prescribed.   If you develop nausea and vomiting that is not controlled by your nausea medication, call the clinic.   BELOW ARE SYMPTOMS THAT SHOULD BE REPORTED IMMEDIATELY:  *FEVER GREATER THAN 100.5 F  *CHILLS WITH OR WITHOUT FEVER  NAUSEA AND VOMITING THAT IS NOT CONTROLLED WITH YOUR NAUSEA MEDICATION  *UNUSUAL SHORTNESS OF BREATH  *UNUSUAL BRUISING OR BLEEDING  TENDERNESS IN MOUTH AND THROAT WITH OR WITHOUT PRESENCE OF ULCERS  *URINARY PROBLEMS  *BOWEL PROBLEMS  UNUSUAL RASH Items with * indicate a potential emergency and should be followed up as soon as possible.  Feel free to call the clinic you have any questions or concerns. The clinic phone number is [phone].    

## 2014-06-18 ENCOUNTER — Ambulatory Visit: Payer: BC Managed Care – PPO

## 2014-06-19 ENCOUNTER — Ambulatory Visit
Admission: RE | Admit: 2014-06-19 | Payer: BC Managed Care – PPO | Source: Ambulatory Visit | Admitting: Radiation Oncology

## 2014-06-19 ENCOUNTER — Ambulatory Visit: Payer: BC Managed Care – PPO

## 2014-06-19 ENCOUNTER — Ambulatory Visit: Payer: BC Managed Care – PPO | Admitting: Radiation Oncology

## 2014-06-20 ENCOUNTER — Ambulatory Visit
Admission: RE | Admit: 2014-06-20 | Discharge: 2014-06-20 | Disposition: A | Discharge status: Home / Self Care | Payer: BC Managed Care – PPO | Source: Ambulatory Visit | Attending: Radiation Oncology | Admitting: Radiation Oncology

## 2014-06-20 ENCOUNTER — Ambulatory Visit: Payer: BC Managed Care – PPO

## 2014-06-20 DIAGNOSIS — Z510 Encounter for antineoplastic radiation therapy: Secondary | ICD-10-CM | POA: Diagnosis not present

## 2014-06-21 ENCOUNTER — Encounter: Payer: Self-pay | Admitting: Radiation Oncology

## 2014-06-21 ENCOUNTER — Ambulatory Visit
Admission: RE | Admit: 2014-06-21 | Discharge: 2014-06-21 | Disposition: A | Discharge status: Home / Self Care | Payer: BC Managed Care – PPO | Source: Ambulatory Visit | Attending: Radiation Oncology | Admitting: Radiation Oncology

## 2014-06-21 ENCOUNTER — Ambulatory Visit: Payer: BC Managed Care – PPO

## 2014-06-21 VITALS — BP 144/95 | HR 80 | Temp 98.1°F | Ht 65.0 in | Wt 118.4 lb

## 2014-06-21 DIAGNOSIS — Z510 Encounter for antineoplastic radiation therapy: Secondary | ICD-10-CM | POA: Diagnosis not present

## 2014-06-21 DIAGNOSIS — C109 Malignant neoplasm of oropharynx, unspecified: Secondary | ICD-10-CM

## 2014-06-21 MED ORDER — SUCRALFATE 1 G PO TABS: 1.0000 g | ORAL_TABLET | Freq: Three times a day (TID) | ORAL | Status: DC

## 2014-06-21 NOTE — Progress Notes (Signed)
   Weekly Management Note:  Outpatient    ICD-9-CM ICD-10-CM   1. Oropharyngeal cancer 146.9 C10.9 sucralfate (CARAFATE) 1 G tablet    Current Dose:  36 Gy  Projected Dose: 70 Gy   Narrative:  The patient presents for routine under treatment assessment.  CBCT/MVCT images/Port film x-rays were reviewed.  The chart was checked. Denies sore throat. Not using sucralfate, yet.  She is nauseous (chemo given 5 days ago) but taking Zofran. Weight stable.  Physical Findings:  height is 5\' 5"  (1.651 m) and weight is 118 lb 6.4 oz (53.706 kg). Her temperature is 98.1 F (36.7 C). Her blood pressure is 144/95 and her pulse is 80.  Upper oropharyngeal mucosa is intact with no thrush.  Skin intact over neck.   Trach stoma - scant white thick fluid.  CBC    Component Value Date/Time   WBC 4.9 06/14/2014 1227   WBC 11.3* 05/22/2014 0310   RBC 3.67* 06/14/2014 1227   RBC 3.65* 05/22/2014 0310   HGB 11.1* 06/14/2014 1227   HGB 11.2* 05/22/2014 0310   HCT 33.7* 06/14/2014 1227   HCT 33.1* 05/22/2014 0310   PLT 375 06/14/2014 1227   PLT 309 05/22/2014 0310   MCV 91.8 06/14/2014 1227   MCV 90.7 05/22/2014 0310   MCH 30.2 06/14/2014 1227   MCH 30.7 05/22/2014 0310   MCHC 32.9 06/14/2014 1227   MCHC 33.8 05/22/2014 0310   RDW 14.7* 06/14/2014 1227   RDW 13.4 05/22/2014 0310   LYMPHSABS 1.2 06/14/2014 1227   LYMPHSABS 2.9 04/14/2014 1020   MONOABS 0.9 06/14/2014 1227   MONOABS 1.6* 04/14/2014 1020   EOSABS 0.1 06/14/2014 1227   EOSABS 0.2 04/14/2014 1020   BASOSABS 0.0 06/14/2014 1227   BASOSABS 0.1 04/14/2014 1020     CMP     Component Value Date/Time   NA 137 06/16/2014 1046   NA 140 05/23/2014 0430   K 4.7 06/16/2014 1046   K 4.2 05/23/2014 0430   CL 100 05/23/2014 0430   CO2 27 06/16/2014 1046   CO2 31 05/23/2014 0430   GLUCOSE 99 06/16/2014 1046   GLUCOSE 107* 05/23/2014 0430   BUN 17.8 06/16/2014 1046   BUN 13 05/23/2014 0430   CREATININE 0.7 06/16/2014 1046   CREATININE 0.62 05/23/2014 0430   CALCIUM 9.2 06/16/2014 1046     CALCIUM 9.7 05/23/2014 0430   PROT 6.9 06/16/2014 1046   PROT 5.6* 05/22/2014 0310   ALBUMIN 3.0* 06/16/2014 1046   ALBUMIN 2.2* 05/22/2014 0310   AST 13 06/16/2014 1046   AST 12 05/22/2014 0310   ALT 6 06/16/2014 1046   ALT 9 05/22/2014 0310   ALKPHOS 84 06/16/2014 1046   ALKPHOS 77 05/22/2014 0310   BILITOT <0.20 06/16/2014 1046   BILITOT 0.2* 05/22/2014 0310   GFRNONAA >90 05/23/2014 0430   GFRAA >90 05/23/2014 0430     Impression:  The patient is tolerating radiotherapy.   Plan:  Continue radiotherapy as planned.   -----------------------------------  Eppie Gibson, MD

## 2014-06-21 NOTE — Progress Notes (Addendum)
Ms. Guida has received 18 fractions to her Vallecular and bilateral neck.  She denies any pain and reports less fatigue.  Mild redness noted on neck and skin remains intact.  Her oral mucosa is without any irritation.  Trach intact and Peg tube sight with mild crusting and redness which she states she will cleanse when she returns to home.  She has a dry, hacky cough today and states she has "allergies."  Enteral nutrition.  Has gained 2 lbs since 06/14/14

## 2014-06-22 ENCOUNTER — Ambulatory Visit: Payer: BC Managed Care – PPO

## 2014-06-22 ENCOUNTER — Ambulatory Visit
Admission: RE | Admit: 2014-06-22 | Discharge: 2014-06-22 | Disposition: A | Discharge status: Home / Self Care | Payer: BC Managed Care – PPO | Source: Ambulatory Visit | Attending: Radiation Oncology | Admitting: Radiation Oncology

## 2014-06-22 DIAGNOSIS — Z510 Encounter for antineoplastic radiation therapy: Secondary | ICD-10-CM | POA: Diagnosis not present

## 2014-06-23 ENCOUNTER — Ambulatory Visit: Payer: BC Managed Care – PPO

## 2014-06-23 ENCOUNTER — Telehealth: Payer: Self-pay | Admitting: *Deleted

## 2014-06-23 ENCOUNTER — Ambulatory Visit: Payer: Self-pay | Admitting: Hematology and Oncology

## 2014-06-23 NOTE — Telephone Encounter (Signed)
Pt missed office visit today w/ Dr. Alvy Bimler.  Called pt and left VM asking if she can still come in today or if she can make it on Monday am at 9:30 am.  Requested she call back and let us know so we can add her on the schedule for Monday.

## 2014-06-23 NOTE — Telephone Encounter (Signed)
Message from pt's son states pt did not come in today because she was told the "machines were down" in [place].  She did not realize she also had appt w/ Dr. Alvy Bimler.  She will come in at 9:30 am on Monday to see Dr. Alvy Bimler.  Request sent to scheduler to add pt on her schedule on 9/21.

## 2014-06-26 ENCOUNTER — Ambulatory Visit (HOSPITAL_BASED_OUTPATIENT_CLINIC_OR_DEPARTMENT_OTHER): Payer: BC Managed Care – PPO | Admitting: Hematology and Oncology

## 2014-06-26 ENCOUNTER — Ambulatory Visit
Admission: RE | Admit: 2014-06-26 | Discharge: 2014-06-26 | Disposition: A | Discharge status: Home / Self Care | Payer: BC Managed Care – PPO | Source: Ambulatory Visit | Attending: Radiation Oncology | Admitting: Radiation Oncology

## 2014-06-26 ENCOUNTER — Other Ambulatory Visit: Payer: Self-pay | Admitting: *Deleted

## 2014-06-26 ENCOUNTER — Encounter: Payer: Self-pay | Admitting: Hematology and Oncology

## 2014-06-26 ENCOUNTER — Ambulatory Visit: Payer: BC Managed Care – PPO

## 2014-06-26 VITALS — BP 117/75 | HR 92 | Temp 98.3°F | Resp 16 | Wt 115.0 lb

## 2014-06-26 VITALS — BP 117/58 | HR 82 | Temp 98.5°F | Resp 18 | Ht 65.0 in | Wt 115.3 lb

## 2014-06-26 DIAGNOSIS — R110 Nausea: Secondary | ICD-10-CM

## 2014-06-26 DIAGNOSIS — R059 Cough, unspecified: Secondary | ICD-10-CM | POA: Insufficient documentation

## 2014-06-26 DIAGNOSIS — Z510 Encounter for antineoplastic radiation therapy: Secondary | ICD-10-CM | POA: Diagnosis not present

## 2014-06-26 DIAGNOSIS — C109 Malignant neoplasm of oropharynx, unspecified: Secondary | ICD-10-CM

## 2014-06-26 DIAGNOSIS — R070 Pain in throat: Secondary | ICD-10-CM

## 2014-06-26 DIAGNOSIS — Z930 Tracheostomy status: Secondary | ICD-10-CM

## 2014-06-26 DIAGNOSIS — E43 Unspecified severe protein-calorie malnutrition: Secondary | ICD-10-CM

## 2014-06-26 DIAGNOSIS — R05 Cough: Secondary | ICD-10-CM

## 2014-06-26 DIAGNOSIS — C100 Malignant neoplasm of vallecula: Secondary | ICD-10-CM

## 2014-06-26 DIAGNOSIS — Z931 Gastrostomy status: Secondary | ICD-10-CM

## 2014-06-26 MED ORDER — FENTANYL 50 MCG/HR TD PT72: 50.0000 ug | MEDICATED_PATCH | TRANSDERMAL | Status: DC

## 2014-06-26 MED ORDER — AMOXICILLIN 250 MG/5ML PO SUSR: 500.0000 mg | Freq: Three times a day (TID) | ORAL | Status: DC

## 2014-06-26 MED ORDER — ONDANSETRON 8 MG PO TBDP: 8.0000 mg | ORAL_TABLET | Freq: Three times a day (TID) | ORAL | Status: DC | PRN

## 2014-06-26 MED ORDER — LIDOCAINE VISCOUS 2 % MT SOLN: OROMUCOSAL | Status: DC

## 2014-06-26 NOTE — Assessment & Plan Note (Signed)
The patient has poor compliance. She tolerated the last cycle of chemotherapy poorly and has made an informed decision not to pursue any further chemotherapy. She is miserable today.

## 2014-06-26 NOTE — Progress Notes (Signed)
   Weekly Management Note:  outpatient  Vallecula  Current Dose:  38 Gy  Projected Dose: 70 Gy   Narrative:  The patient presents for routine under treatment assessment.  CBCT/MVCT images/Port film x-rays were reviewed.  The chart was checked. Moist cough but no SOB. Feels her allergies are worse with her trach and is wanted to discuss its removal with ENT.  She is calling to make an appt.  She is still having some dysphagia and odynophagia  I spoke with Dr. Alvy Bimler this AM.  The patient has not attended all supportive care appointments as recommended and is declining IV fluids as recommended. She started her on an antibiotic today.  Physical Findings:  weight is 115 lb (52.164 kg). Her oral temperature is 98.3 F (36.8 C). Her blood pressure is 117/75 and her pulse is 92. Her respiration is 16 and oxygen saturation is 98%.  Proximal Oropharyngeal mucosa is intact and moist with no thrush or lesions.   Skin intact and erythematous over neck.  Stoma clean.    CBC    Component Value Date/Time   WBC 4.9 06/14/2014 1227   WBC 11.3* 05/22/2014 0310   RBC 3.67* 06/14/2014 1227   RBC 3.65* 05/22/2014 0310   HGB 11.1* 06/14/2014 1227   HGB 11.2* 05/22/2014 0310   HCT 33.7* 06/14/2014 1227   HCT 33.1* 05/22/2014 0310   PLT 375 06/14/2014 1227   PLT 309 05/22/2014 0310   MCV 91.8 06/14/2014 1227   MCV 90.7 05/22/2014 0310   MCH 30.2 06/14/2014 1227   MCH 30.7 05/22/2014 0310   MCHC 32.9 06/14/2014 1227   MCHC 33.8 05/22/2014 0310   RDW 14.7* 06/14/2014 1227   RDW 13.4 05/22/2014 0310   LYMPHSABS 1.2 06/14/2014 1227   LYMPHSABS 2.9 04/14/2014 1020   MONOABS 0.9 06/14/2014 1227   MONOABS 1.6* 04/14/2014 1020   EOSABS 0.1 06/14/2014 1227   EOSABS 0.2 04/14/2014 1020   BASOSABS 0.0 06/14/2014 1227   BASOSABS 0.1 04/14/2014 1020     CMP     Component Value Date/Time   NA 137 06/16/2014 1046   NA 140 05/23/2014 0430   K 4.7 06/16/2014 1046   K 4.2 05/23/2014 0430   CL 100 05/23/2014 0430   CO2 27 06/16/2014 1046   CO2  31 05/23/2014 0430   GLUCOSE 99 06/16/2014 1046   GLUCOSE 107* 05/23/2014 0430   BUN 17.8 06/16/2014 1046   BUN 13 05/23/2014 0430   CREATININE 0.7 06/16/2014 1046   CREATININE 0.62 05/23/2014 0430   CALCIUM 9.2 06/16/2014 1046   CALCIUM 9.7 05/23/2014 0430   PROT 6.9 06/16/2014 1046   PROT 5.6* 05/22/2014 0310   ALBUMIN 3.0* 06/16/2014 1046   ALBUMIN 2.2* 05/22/2014 0310   AST 13 06/16/2014 1046   AST 12 05/22/2014 0310   ALT 6 06/16/2014 1046   ALT 9 05/22/2014 0310   ALKPHOS 84 06/16/2014 1046   ALKPHOS 77 05/22/2014 0310   BILITOT <0.20 06/16/2014 1046   BILITOT 0.2* 05/22/2014 0310   GFRNONAA >90 05/23/2014 0430   GFRAA >90 05/23/2014 0430     Impression:  The patient is tolerating radiotherapy.   Plan:  Continue radiotherapy as planned. Lidocaine Rx'd with instructions to make throat elixir.   -----------------------------------  Eppie Gibson, MD

## 2014-06-26 NOTE — Progress Notes (Signed)
[place] OFFICE PROGRESS NOTE  Patient Care Team: Jonathon Bellows, MD as PCP - General (Family Medicine) Heather [place], [place] as Consulting Physician (Optometry) Brooks Sailors, RN as Oncology Nurse Navigator (Oncology) Heath Lark, MD as Consulting Physician (Hematology and Oncology) Eppie Gibson, MD as Attending Physician (Radiation Oncology) Karie Mainland, RD as Dietitian (Nutrition)  SUMMARY OF ONCOLOGIC HISTORY: Oncology History   Carcinoma of vallecula epiglottica Oropharyngeal cancer   Primary site: Pharynx - Oropharynx   Staging method: AJCC 7th Edition   Clinical: Stage IVA (T3, N2c, M0) signed by Eppie Gibson, MD on 04/20/2014  3:33 PM   Summary: Stage IVA (T3, N2c, M0)       Oropharyngeal cancer   04/14/2014 Imaging CT scan of the neck showed oropharyngeal mass.   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   04/14/2014 Surgery Laryngoscopy of the lower tongue base, vallecula, and lingual surface of the epiglottis showed a fungating irregular mass   04/26/2014 Imaging PET/CT scan showed bilateral lymphadenopathy   04/27/2014 Imaging Barium swallow shows severe dysphagia.   05/18/2014 - 05/22/2014 Hospital Admission The patient was admitted to the hospital for tracheostomy, placement of port and feeding tube.   05/24/2014 - 06/16/2014 Chemotherapy She is started on high dose cisplatin. She only received 2 doses due to intolerable side effects and patient has made an informed decision not to pursue a final dose of chemotherapy   05/24/2014 -  Radiation Therapy She is started on radiation treatment    INTERVAL HISTORY: Please see below for problem oriented charting. She is seen today for her regular supportive care visit. She missed her appointment last week. After cycle 2 of chemotherapy, she has a severe nausea and vomiting. She also has significant irritation with coughing and  the coughing subsequently induced vomiting. She denies constipation. She felt that her pain is well-controlled with current pain regimen.  REVIEW OF SYSTEMS:   Constitutional: Denies fevers, chills or abnormal weight loss Eyes: Denies blurriness of vision Ears, nose, mouth, throat, and face: Denies mucositis or sore throat Cardiovascular: Denies palpitation, chest discomfort or lower extremity swelling Skin: Denies abnormal skin rashes Lymphatics: Denies new lymphadenopathy or easy bruising Neurological:Denies numbness, tingling or new weaknesses Behavioral/Psych: Mood is stable, no new changes  All other systems were reviewed with the patient and are negative.  I have reviewed the past medical history, past surgical history, social history and family history with the patient and they are unchanged from previous note.  ALLERGIES:  is allergic to penicillins.  MEDICATIONS:  Current Outpatient Prescriptions  Medication Sig Dispense Refill  . diazepam (VALIUM) 10 MG tablet Take 5 mg by mouth at bedtime.       . fentaNYL (DURAGESIC - DOSED MCG/HR) 50 MCG/HR Place 1 patch (50 mcg total) onto the skin every 3 (three) days.  5 patch  0  . ipratropium-albuterol (DUONEB) 0.5-2.5 (3) MG/3ML SOLN Take 3 mLs by nebulization every 4 (four) hours as needed (for shortness of breath).       . lidocaine-prilocaine (EMLA) cream Apply 1 application topically as needed (for port/chemo).      Marland Kitchen metoCLOPramide (REGLAN) 10 MG tablet       . Morphine Sulfate (MORPHINE CONCENTRATE) 10 mg / 0.5 ml concentrated solution       . Nutritional Supplements (FEEDING SUPPLEMENT, JEVITY 1.2 CAL,) LIQD Place 40 mL/hr into feeding tube continuous.      Marland Kitchen  ondansetron (ZOFRAN ODT) 8 MG disintegrating tablet Take 1 tablet (8 mg total) by mouth every 8 (eight) hours as needed for nausea or vomiting.  60 tablet  3  . PARoxetine (PAXIL) 20 MG tablet Take 20 mg by mouth at bedtime.      Marland Kitchen PHENADOZ 25 MG suppository       .  polyethylene glycol (MIRALAX / GLYCOLAX) packet Take 17 g by mouth daily as needed for moderate constipation.      . sodium fluoride (FLUORISHIELD) 1.1 % GEL dental gel Instill one drop of fluoride per tooth space of fluoride tray. Place over teeth for 5 minutes. Remove. Spit out excess. Repeat nightly.  120 mL  prn  . sucralfate (CARAFATE) 1 G tablet Take 1 tablet (1 g total) by mouth 4 (four) times daily -  with meals and at bedtime. Dissolve in 10 mL H2O      . dexamethasone (DECADRON) 4 MG tablet Take 1 tablet twice a day for 3 days for nausea. After three days, take up to two times a day as needed.  20 tablet  0  . lidocaine (XYLOCAINE) 2 % solution Mix 1 part 2% viscous lidocaine, 1 part H20. Swish and/or swallow 10 mL of this mixture, up to QID, to soothe throat.  100 mL  5   No current facility-administered medications for this visit.    PHYSICAL EXAMINATION: ECOG PERFORMANCE STATUS: 1 - Symptomatic but completely ambulatory  Filed Vitals:   06/26/14 0946  BP: 117/58  Pulse: 82  Temp: 98.5 F (36.9 C)  Resp: 18   Filed Weights   06/26/14 0946  Weight: 115 lb 4.8 oz (52.3 kg)    GENERAL:alert, no distress and comfortable. She looks thin and cachectic SKIN: skin color, texture, turgor are normal, no rashes or significant lesions EYES: normal, Conjunctiva are pink and non-injected, sclera clear OROPHARYNX:no exudate, no erythema and lips, buccal mucosa, and tongue normal  NECK: supple, her tracheostomy site looks okay LYMPH:  no palpable lymphadenopathy in the cervical, axillary or inguinal LUNGS: clear to auscultation and percussion with normal breathing effort HEART: regular rate & rhythm and no murmurs and no lower extremity edema ABDOMEN:abdomen soft, non-tender and normal bowel sounds. The feeding tube site looks okay Musculoskeletal:no cyanosis of digits and no clubbing  NEURO: alert & oriented x 3 with fluent speech, no focal motor/sensory deficits  LABORATORY DATA:   I have reviewed the data as listed    Component Value Date/Time   NA 137 06/16/2014 1046   NA 140 05/23/2014 0430   K 4.7 06/16/2014 1046   K 4.2 05/23/2014 0430   CL 100 05/23/2014 0430   CO2 27 06/16/2014 1046   CO2 31 05/23/2014 0430   GLUCOSE 99 06/16/2014 1046   GLUCOSE 107* 05/23/2014 0430   BUN 17.8 06/16/2014 1046   BUN 13 05/23/2014 0430   CREATININE 0.7 06/16/2014 1046   CREATININE 0.62 05/23/2014 0430   CALCIUM 9.2 06/16/2014 1046   CALCIUM 9.7 05/23/2014 0430   PROT 6.9 06/16/2014 1046   PROT 5.6* 05/22/2014 0310   ALBUMIN 3.0* 06/16/2014 1046   ALBUMIN 2.2* 05/22/2014 0310   AST 13 06/16/2014 1046   AST 12 05/22/2014 0310   ALT 6 06/16/2014 1046   ALT 9 05/22/2014 0310   ALKPHOS 84 06/16/2014 1046   ALKPHOS 77 05/22/2014 0310   BILITOT <0.20 06/16/2014 1046   BILITOT 0.2* 05/22/2014 0310   GFRNONAA >90 05/23/2014 0430   GFRAA >90 05/23/2014 0430  No results found for this basename: SPEP, UPEP,  kappa and lambda light chains    Lab Results  Component Value Date   WBC 4.9 06/14/2014   NEUTROABS 2.8 06/14/2014   HGB 11.1* 06/14/2014   HCT 33.7* 06/14/2014   MCV 91.8 06/14/2014   PLT 375 06/14/2014      Chemistry      Component Value Date/Time   NA 137 06/16/2014 1046   NA 140 05/23/2014 0430   K 4.7 06/16/2014 1046   K 4.2 05/23/2014 0430   CL 100 05/23/2014 0430   CO2 27 06/16/2014 1046   CO2 31 05/23/2014 0430   BUN 17.8 06/16/2014 1046   BUN 13 05/23/2014 0430   CREATININE 0.7 06/16/2014 1046   CREATININE 0.62 05/23/2014 0430      Component Value Date/Time   CALCIUM 9.2 06/16/2014 1046   CALCIUM 9.7 05/23/2014 0430   ALKPHOS 84 06/16/2014 1046   ALKPHOS 77 05/22/2014 0310   AST 13 06/16/2014 1046   AST 12 05/22/2014 0310   ALT 6 06/16/2014 1046   ALT 9 05/22/2014 0310   BILITOT <0.20 06/16/2014 1046   BILITOT 0.2* 05/22/2014 0310      ASSESSMENT & PLAN:  Oropharyngeal cancer The patient has poor compliance. She tolerated the last cycle of chemotherapy poorly and has made an informed  decision not to pursue any further chemotherapy. She is miserable today.  Throat pain Her pain appears to be well-controlled with current pain medicine. I refilled her prescription fentanyl patch.    Protein-calorie malnutrition, severe She will continue on continuous tube feeding.  S/P percutaneous endoscopic gastrostomy (PEG) tube placement The feeding tube looks fine with no infection     Tracheostomy status Tracheostomy site looks fine without signs of infection. However, she has recurrent cough and demanded to have her tracheostomy tube removed. She will contact the ENT physican if she desired to have her tracheostomy removed. I gave her prescription antibiotics for one week to cover her for potential upper respiratory tract infection.     Nausea alone I recommend Reglan before meals. She believes that her nausea is worse after cycle 2 of chemotherapy, which I do not doubt. She missed her appointment last week and refused to come in for supportive care visit. She believes the tracheostomy tube is irritating her throat causing her to cough and then vomit. She declined IV fluids & IV anti-emetics today.  I refilled her prescription Zofran ODT  Cough I believe this could be related to throat irritation. I gave her a prescription of antibiotics for one week in case she has upper respiratory tract infection.   No orders of the defined types were placed in this encounter.   All questions were answered. The patient knows to call the clinic with any problems, questions or concerns. No barriers to learning was detected. I spent 30 minutes counseling the patient face to face. The total time spent in the appointment was 40 minutes and more than 50% was on counseling and review of test results     [facility] [place], MD 06/26/2014 8:09 PM

## 2014-06-26 NOTE — Assessment & Plan Note (Addendum)
Tracheostomy site looks fine without signs of infection. However, she has recurrent cough and demanded to have her tracheostomy tube removed. She will contact the ENT physican if she desired to have her tracheostomy removed. I gave her prescription antibiotics for one week to cover her for potential upper respiratory tract infection.

## 2014-06-26 NOTE — Assessment & Plan Note (Signed)
I believe this could be related to throat irritation. I gave her a prescription of antibiotics for one week in case she has upper respiratory tract infection.

## 2014-06-26 NOTE — Assessment & Plan Note (Signed)
She will continue on continuous tube feeding.

## 2014-06-26 NOTE — Progress Notes (Signed)
She is currently in no pain. Fatigue, Generalized Weakness and Poor Appetite.  Pt presenting appropriate quality, quantity and organization of sentences, faint. Pt reports dull pain, unilateral in the right temporal area. Occasional dysphagia. The patient does not eat regular meals due to tube feed.  Takes 5 cans of Jevity... Oral exam reveals mucous membranes moist, pharynx normal without lesions. Continues to apply Biafine to neck, noted hyperpigmentation and erythema. Posterior Lung sounds clear over upper and lower lobes bilaterally, noted course sounds during coughing.  Pt exhibits a moist harsh cough, which is productive at times for clear mucus.  She currently was written Amoxil 250mg /30ml per Dr Alvy Bimler for a sinus infection.

## 2014-06-26 NOTE — Assessment & Plan Note (Addendum)
I recommend Reglan before meals. She believes that her nausea is worse after cycle 2 of chemotherapy, which I do not doubt. She missed her appointment last week and refused to come in for supportive care visit. She believes the tracheostomy tube is irritating her throat causing her to cough and then vomit. She declined IV fluids & IV anti-emetics today.  I refilled her prescription Zofran ODT

## 2014-06-26 NOTE — Assessment & Plan Note (Signed)
Her pain appears to be well-controlled with current pain medicine. I refilled her prescription fentanyl patch.

## 2014-06-26 NOTE — Assessment & Plan Note (Signed)
The feeding tube looks fine with no infection

## 2014-06-27 ENCOUNTER — Ambulatory Visit
Admission: RE | Admit: 2014-06-27 | Discharge: 2014-06-27 | Disposition: A | Discharge status: Home / Self Care | Payer: BC Managed Care – PPO | Source: Ambulatory Visit | Attending: Radiation Oncology | Admitting: Radiation Oncology

## 2014-06-27 ENCOUNTER — Ambulatory Visit: Payer: Self-pay | Admitting: Hematology and Oncology

## 2014-06-27 DIAGNOSIS — Z510 Encounter for antineoplastic radiation therapy: Secondary | ICD-10-CM | POA: Diagnosis not present

## 2014-06-28 ENCOUNTER — Telehealth: Payer: Self-pay | Admitting: Hematology and Oncology

## 2014-06-28 ENCOUNTER — Ambulatory Visit
Admission: RE | Admit: 2014-06-28 | Discharge: 2014-06-28 | Disposition: A | Discharge status: Home / Self Care | Payer: BC Managed Care – PPO | Source: Ambulatory Visit | Attending: Radiation Oncology | Admitting: Radiation Oncology

## 2014-06-28 DIAGNOSIS — Z510 Encounter for antineoplastic radiation therapy: Secondary | ICD-10-CM | POA: Diagnosis not present

## 2014-06-28 NOTE — Telephone Encounter (Signed)
s.w. pt and advised on OCT appt....pt ok and aware °

## 2014-06-29 ENCOUNTER — Ambulatory Visit
Admission: RE | Admit: 2014-06-29 | Discharge: 2014-06-29 | Disposition: A | Discharge status: Home / Self Care | Payer: BC Managed Care – PPO | Source: Ambulatory Visit | Attending: Radiation Oncology | Admitting: Radiation Oncology

## 2014-06-29 DIAGNOSIS — Z510 Encounter for antineoplastic radiation therapy: Secondary | ICD-10-CM | POA: Diagnosis not present

## 2014-06-30 ENCOUNTER — Ambulatory Visit
Admission: RE | Admit: 2014-06-30 | Discharge: 2014-06-30 | Disposition: A | Discharge status: Home / Self Care | Payer: BC Managed Care – PPO | Source: Ambulatory Visit | Attending: Radiation Oncology | Admitting: Radiation Oncology

## 2014-06-30 ENCOUNTER — Encounter: Payer: Self-pay | Admitting: *Deleted

## 2014-06-30 DIAGNOSIS — Z510 Encounter for antineoplastic radiation therapy: Secondary | ICD-10-CM | POA: Diagnosis not present

## 2014-06-30 NOTE — Progress Notes (Signed)
Met with patient s/p RT to provide support and care continuity.  She reported that she is still on continuous feed but hopes to transition to oral intake after her trach is removed next week Thursday, 10/1.  Subjectively, she was upbeat and positive.  She is "counting down" to her final RT. She denied any needs or concerns; I encouraged her to contact me if that changes before I see her next, she verbalized agreement.  Gayleen Orem, RN, BSN, [place] [facility] [place] [phone]

## 2014-07-03 ENCOUNTER — Ambulatory Visit
Admission: RE | Admit: 2014-07-03 | Discharge: 2014-07-03 | Disposition: A | Discharge status: Home / Self Care | Payer: BC Managed Care – PPO | Source: Ambulatory Visit | Attending: Radiation Oncology | Admitting: Radiation Oncology

## 2014-07-03 ENCOUNTER — Encounter: Payer: Self-pay | Admitting: Radiation Oncology

## 2014-07-03 VITALS — BP 128/81 | HR 89 | Temp 98.5°F | Ht 65.0 in | Wt 117.7 lb

## 2014-07-03 DIAGNOSIS — Z510 Encounter for antineoplastic radiation therapy: Secondary | ICD-10-CM | POA: Diagnosis not present

## 2014-07-03 DIAGNOSIS — C109 Malignant neoplasm of oropharynx, unspecified: Secondary | ICD-10-CM

## 2014-07-03 DIAGNOSIS — C100 Malignant neoplasm of vallecula: Secondary | ICD-10-CM

## 2014-07-03 MED ORDER — HYDROCODONE-HOMATROPINE 5-1.5 MG/5ML PO SYRP: 5.0000 mL | ORAL_SOLUTION | ORAL | Status: DC | PRN

## 2014-07-03 NOTE — Progress Notes (Signed)
Stacey Carroll has received 25 fractions to her vallecula and bilateral neck. She grades the pain in her throat as a level 15 on a scale 0-10 and states that she is not receiving adequate pain relief.  She states that she is taking her trach out, herself, today, and is not waiting for the ENT to remove it on this Thursday.  "I am sick of it, and I keep choking with it in." She reports waking up 15 times last pm with choking episodes and twoile being interviewed today.  Stacey Carroll is patent.  Note redness and dry desquamation of her neck left and right neck regions.  Applying Biafine at least twice daily.  Mouth is clear.   She rports having difficulty hearing.

## 2014-07-03 NOTE — Progress Notes (Signed)
   Weekly Management Note:  Outpatient    ICD-9-CM ICD-10-CM   1. Oropharyngeal cancer 146.9 C10.9 HYDROcodone-homatropine (HYCODAN) 5-1.5 MG/5ML syrup  2. Carcinoma of vallecula epiglottica 146.3 C10.0     Current Dose:  50 Gy  Projected Dose: 70 Gy   Narrative:  The patient presents for routine under treatment assessment.  CBCT/MVCT images/Port film x-rays were reviewed.  The chart was checked. She is coughing, insisting that it is due to her trach and she wants to remove it herself, today.  Has ENT appt later this week.  She has throat pain (ie duragesic and morphine). Ringing in ears - has stopped chemotherapy.  Physical Findings:  height is 5\' 5"  (1.651 m) and weight is 117 lb 11.2 oz (53.388 kg). Her temperature is 98.5 F (36.9 C). Her blood pressure is 128/81 and her pulse is 89.  Coughing, tearful. Thick sputum. No upper oropharyngeal lesions or thrush. Trach - no sign of infection.  Skin is red and intact.  CBC    Component Value Date/Time   WBC 4.9 06/14/2014 1227   WBC 11.3* 05/22/2014 0310   RBC 3.67* 06/14/2014 1227   RBC 3.65* 05/22/2014 0310   HGB 11.1* 06/14/2014 1227   HGB 11.2* 05/22/2014 0310   HCT 33.7* 06/14/2014 1227   HCT 33.1* 05/22/2014 0310   PLT 375 06/14/2014 1227   PLT 309 05/22/2014 0310   MCV 91.8 06/14/2014 1227   MCV 90.7 05/22/2014 0310   MCH 30.2 06/14/2014 1227   MCH 30.7 05/22/2014 0310   MCHC 32.9 06/14/2014 1227   MCHC 33.8 05/22/2014 0310   RDW 14.7* 06/14/2014 1227   RDW 13.4 05/22/2014 0310   LYMPHSABS 1.2 06/14/2014 1227   LYMPHSABS 2.9 04/14/2014 1020   MONOABS 0.9 06/14/2014 1227   MONOABS 1.6* 04/14/2014 1020   EOSABS 0.1 06/14/2014 1227   EOSABS 0.2 04/14/2014 1020   BASOSABS 0.0 06/14/2014 1227   BASOSABS 0.1 04/14/2014 1020     CMP     Component Value Date/Time   NA 137 06/16/2014 1046   NA 140 05/23/2014 0430   K 4.7 06/16/2014 1046   K 4.2 05/23/2014 0430   CL 100 05/23/2014 0430   CO2 27 06/16/2014 1046   CO2 31 05/23/2014 0430   GLUCOSE 99 06/16/2014  1046   GLUCOSE 107* 05/23/2014 0430   BUN 17.8 06/16/2014 1046   BUN 13 05/23/2014 0430   CREATININE 0.7 06/16/2014 1046   CREATININE 0.62 05/23/2014 0430   CALCIUM 9.2 06/16/2014 1046   CALCIUM 9.7 05/23/2014 0430   PROT 6.9 06/16/2014 1046   PROT 5.6* 05/22/2014 0310   ALBUMIN 3.0* 06/16/2014 1046   ALBUMIN 2.2* 05/22/2014 0310   AST 13 06/16/2014 1046   AST 12 05/22/2014 0310   ALT 6 06/16/2014 1046   ALT 9 05/22/2014 0310   ALKPHOS 84 06/16/2014 1046   ALKPHOS 77 05/22/2014 0310   BILITOT <0.20 06/16/2014 1046   BILITOT 0.2* 05/22/2014 0310   GFRNONAA >90 05/23/2014 0430   GFRAA >90 05/23/2014 0430     Impression:  The patient is tolerating radiotherapy.   Plan:  Continue radiotherapy as planned. Malachy Mood, RN moved up ENT appt to 9-30. Pt willing to wait until then. Hycodan Rx for cough.  -----------------------------------  Eppie Gibson, MD

## 2014-07-04 ENCOUNTER — Ambulatory Visit
Admission: RE | Admit: 2014-07-04 | Discharge: 2014-07-04 | Disposition: A | Discharge status: Home / Self Care | Payer: BC Managed Care – PPO | Source: Ambulatory Visit | Attending: Radiation Oncology | Admitting: Radiation Oncology

## 2014-07-04 DIAGNOSIS — Z510 Encounter for antineoplastic radiation therapy: Secondary | ICD-10-CM | POA: Diagnosis not present

## 2014-07-05 ENCOUNTER — Ambulatory Visit: Payer: BC Managed Care – PPO

## 2014-07-05 ENCOUNTER — Ambulatory Visit
Admission: RE | Admit: 2014-07-05 | Discharge: 2014-07-05 | Disposition: A | Discharge status: Home / Self Care | Payer: BC Managed Care – PPO | Source: Ambulatory Visit | Attending: Radiation Oncology | Admitting: Radiation Oncology

## 2014-07-05 DIAGNOSIS — Z510 Encounter for antineoplastic radiation therapy: Secondary | ICD-10-CM | POA: Diagnosis not present

## 2014-07-06 ENCOUNTER — Ambulatory Visit (HOSPITAL_BASED_OUTPATIENT_CLINIC_OR_DEPARTMENT_OTHER): Payer: BC Managed Care – PPO | Admitting: Hematology and Oncology

## 2014-07-06 ENCOUNTER — Ambulatory Visit: Payer: BC Managed Care – PPO | Admitting: Radiation Oncology

## 2014-07-06 ENCOUNTER — Ambulatory Visit
Admission: RE | Admit: 2014-07-06 | Discharge: 2014-07-06 | Disposition: A | Discharge status: Home / Self Care | Payer: BC Managed Care – PPO | Source: Ambulatory Visit | Attending: Radiation Oncology | Admitting: Radiation Oncology

## 2014-07-06 VITALS — BP 101/69 | HR 69 | Temp 98.0°F | Resp 18 | Ht 65.0 in | Wt 119.7 lb

## 2014-07-06 DIAGNOSIS — R070 Pain in throat: Secondary | ICD-10-CM

## 2014-07-06 DIAGNOSIS — C109 Malignant neoplasm of oropharynx, unspecified: Secondary | ICD-10-CM

## 2014-07-06 DIAGNOSIS — E43 Unspecified severe protein-calorie malnutrition: Secondary | ICD-10-CM

## 2014-07-06 DIAGNOSIS — Z510 Encounter for antineoplastic radiation therapy: Secondary | ICD-10-CM | POA: Diagnosis not present

## 2014-07-06 DIAGNOSIS — R110 Nausea: Secondary | ICD-10-CM

## 2014-07-06 DIAGNOSIS — C100 Malignant neoplasm of vallecula: Secondary | ICD-10-CM | POA: Diagnosis not present

## 2014-07-06 NOTE — Assessment & Plan Note (Signed)
Her pain appears to be well-controlled with current pain medicine.

## 2014-07-06 NOTE — Progress Notes (Signed)
[place] OFFICE PROGRESS NOTE  Patient Care Team: Jonathon Bellows, MD as PCP - General (Family Medicine) Heather [place], [place] as Consulting Physician (Optometry) Brooks Sailors, RN as Oncology Nurse Navigator (Oncology) Heath Lark, MD as Consulting Physician (Hematology and Oncology) Eppie Gibson, MD as Attending Physician (Radiation Oncology) Karie Mainland, RD as Dietitian (Nutrition)  SUMMARY OF ONCOLOGIC HISTORY: Oncology History   Carcinoma of vallecula epiglottica Oropharyngeal cancer   Primary site: Pharynx - Oropharynx   Staging method: AJCC 7th Edition   Clinical: Stage IVA (T3, N2c, M0) signed by Eppie Gibson, MD on 04/20/2014  3:33 PM   Summary: Stage IVA (T3, N2c, M0)       Oropharyngeal cancer   04/14/2014 Imaging CT scan of the neck showed oropharyngeal mass.   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   04/14/2014 Surgery Laryngoscopy of the lower tongue base, vallecula, and lingual surface of the epiglottis showed a fungating irregular mass   04/26/2014 Imaging PET/CT scan showed bilateral lymphadenopathy   04/27/2014 Imaging Barium swallow shows severe dysphagia.   05/18/2014 - 05/22/2014 Hospital Admission The patient was admitted to the hospital for tracheostomy, placement of port and feeding tube.   05/24/2014 - 06/16/2014 Chemotherapy She is started on high dose cisplatin. She only received 2 doses due to intolerable side effects and patient has made an informed decision not to pursue a final dose of chemotherapy   05/24/2014 -  Radiation Therapy She is started on radiation treatment    INTERVAL HISTORY: Please see below for problem oriented charting. She has tracheostomy removed recently. She felt slightly better. She has persistent nausea  REVIEW OF SYSTEMS:   Constitutional: Denies fevers, chills or abnormal weight loss Eyes: Denies blurriness of  vision Respiratory: Denies cough, dyspnea or wheezes Cardiovascular: Denies palpitation, chest discomfort or lower extremity swelling Skin: Denies abnormal skin rashes Lymphatics: Denies new lymphadenopathy or easy bruising Neurological:Denies numbness, tingling or new weaknesses Behavioral/Psych: Mood is stable, no new changes  All other systems were reviewed with the patient and are negative.  I have reviewed the past medical history, past surgical history, social history and family history with the patient and they are unchanged from previous note.  ALLERGIES:  is allergic to penicillins.  MEDICATIONS:  Current Outpatient Prescriptions  Medication Sig Dispense Refill  . diazepam (VALIUM) 10 MG tablet Take 5 mg by mouth at bedtime.       . fentaNYL (DURAGESIC - DOSED MCG/HR) 50 MCG/HR Place 1 patch (50 mcg total) onto the skin every 3 (three) days.  5 patch  0  . HYDROcodone-homatropine (HYCODAN) 5-1.5 MG/5ML syrup Take 5 mLs by mouth every 4 (four) hours as needed for cough.  240 mL  0  . ipratropium-albuterol (DUONEB) 0.5-2.5 (3) MG/3ML SOLN Take 3 mLs by nebulization every 4 (four) hours as needed (for shortness of breath).       . lidocaine (XYLOCAINE) 2 % solution Mix 1 part 2% viscous lidocaine, 1 part H20. Swish and/or swallow 10 mL of this mixture, up to QID, to soothe throat.  100 mL  5  . lidocaine-prilocaine (EMLA) cream Apply 1 application topically as needed (for port/chemo).      Marland Kitchen metoCLOPramide (REGLAN) 10 MG tablet       . Nutritional Supplements (FEEDING SUPPLEMENT, JEVITY 1.2 CAL,) LIQD Place 40 mL/hr into feeding tube continuous.      . ondansetron (ZOFRAN ODT) 8  MG disintegrating tablet Take 1 tablet (8 mg total) by mouth every 8 (eight) hours as needed for nausea or vomiting.  60 tablet  3  . PARoxetine (PAXIL) 20 MG tablet Take 20 mg by mouth at bedtime.      Marland Kitchen PHENADOZ 25 MG suppository       . polyethylene glycol (MIRALAX / GLYCOLAX) packet Take 17 g by mouth  daily as needed for moderate constipation.      . sodium fluoride (FLUORISHIELD) 1.1 % GEL dental gel Instill one drop of fluoride per tooth space of fluoride tray. Place over teeth for 5 minutes. Remove. Spit out excess. Repeat nightly.  120 mL  prn  . sucralfate (CARAFATE) 1 G tablet Take 1 tablet (1 g total) by mouth 4 (four) times daily -  with meals and at bedtime. Dissolve in 10 mL H2O       No current facility-administered medications for this visit.    PHYSICAL EXAMINATION: ECOG PERFORMANCE STATUS: 1 - Symptomatic but completely ambulatory  Filed Vitals:   07/06/14 1139  BP: 101/69  Pulse: 69  Temp: 98 F (36.7 C)  Resp: 18   Filed Weights   07/06/14 1139  Weight: 119 lb 11.2 oz (54.296 kg)    GENERAL:alert, no distress and comfortable. She looks thin SKIN: skin color, texture, turgor are normal, no rashes or significant lesions EYES: normal, Conjunctiva are pink and non-injected, sclera clear OROPHARYNX:no exudate, no erythema and lips, buccal mucosa, and tongue normal  NECK: supple, thyroid normal size, non-tender, without nodularity. Her tracheostomy has been removed. LYMPH:  no palpable lymphadenopathy in the cervical, axillary or inguinal LUNGS: clear to auscultation and percussion with normal breathing effort HEART: regular rate & rhythm and no murmurs and no lower extremity edema ABDOMEN:abdomen soft, non-tender and normal bowel sounds. Feeding tube site looks okay Musculoskeletal:no cyanosis of digits and no clubbing  NEURO: alert & oriented x 3 with fluent speech, no focal motor/sensory deficits  LABORATORY DATA:  I have reviewed the data as listed    Component Value Date/Time   NA 137 06/16/2014 1046   NA 140 05/23/2014 0430   K 4.7 06/16/2014 1046   K 4.2 05/23/2014 0430   CL 100 05/23/2014 0430   CO2 27 06/16/2014 1046   CO2 31 05/23/2014 0430   GLUCOSE 99 06/16/2014 1046   GLUCOSE 107* 05/23/2014 0430   BUN 17.8 06/16/2014 1046   BUN 13 05/23/2014 0430    CREATININE 0.7 06/16/2014 1046   CREATININE 0.62 05/23/2014 0430   CALCIUM 9.2 06/16/2014 1046   CALCIUM 9.7 05/23/2014 0430   PROT 6.9 06/16/2014 1046   PROT 5.6* 05/22/2014 0310   ALBUMIN 3.0* 06/16/2014 1046   ALBUMIN 2.2* 05/22/2014 0310   AST 13 06/16/2014 1046   AST 12 05/22/2014 0310   ALT 6 06/16/2014 1046   ALT 9 05/22/2014 0310   ALKPHOS 84 06/16/2014 1046   ALKPHOS 77 05/22/2014 0310   BILITOT <0.20 06/16/2014 1046   BILITOT 0.2* 05/22/2014 0310   GFRNONAA >90 05/23/2014 0430   GFRAA >90 05/23/2014 0430    No results found for this basename: SPEP, UPEP,  kappa and lambda light chains    Lab Results  Component Value Date   WBC 4.9 06/14/2014   NEUTROABS 2.8 06/14/2014   HGB 11.1* 06/14/2014   HCT 33.7* 06/14/2014   MCV 91.8 06/14/2014   PLT 375 06/14/2014      Chemistry      Component Value Date/Time  NA 137 06/16/2014 1046   NA 140 05/23/2014 0430   K 4.7 06/16/2014 1046   K 4.2 05/23/2014 0430   CL 100 05/23/2014 0430   CO2 27 06/16/2014 1046   CO2 31 05/23/2014 0430   BUN 17.8 06/16/2014 1046   BUN 13 05/23/2014 0430   CREATININE 0.7 06/16/2014 1046   CREATININE 0.62 05/23/2014 0430      Component Value Date/Time   CALCIUM 9.2 06/16/2014 1046   CALCIUM 9.7 05/23/2014 0430   ALKPHOS 84 06/16/2014 1046   ALKPHOS 77 05/22/2014 0310   AST 13 06/16/2014 1046   AST 12 05/22/2014 0310   ALT 6 06/16/2014 1046   ALT 9 05/22/2014 0310   BILITOT <0.20 06/16/2014 1046   BILITOT 0.2* 05/22/2014 0310     ASSESSMENT & PLAN:  Oropharyngeal cancer She tolerates radiation well. Continue supportive care.  Nausea without vomiting This is currently well-controlled.  Protein-calorie malnutrition, severe She continues to use nutritional feeding through feeding tube.  Throat pain Her pain appears to be well-controlled with current pain medicine.    No orders of the defined types were placed in this encounter.   All questions were answered. The patient knows to call the clinic with any problems,  questions or concerns. No barriers to learning was detected. I spent 15 minutes counseling the patient face to face. The total time spent in the appointment was 20 minutes and more than 50% was on counseling and review of test results     [facility], [place], MD 07/06/2014 7:08 PM

## 2014-07-06 NOTE — Progress Notes (Signed)
Pt here due to concerns with wearing tape over stoma site closure.  Noted pt had an area of redness surrounding site.  Area dressed with a folded 4x4 gauze drsg covered with telfa and held in place with gauze wrap around neck, secured with tape.  Additional supplies given for at home use.  Informed pt to come to radiation nursing after radiation treatments to have new dressing applied.  Pt states the dressing feelings comfortable.

## 2014-07-06 NOTE — Assessment & Plan Note (Signed)
She continues to use nutritional feeding through feeding tube.

## 2014-07-06 NOTE — Assessment & Plan Note (Signed)
This is currently well controlled 

## 2014-07-06 NOTE — Assessment & Plan Note (Signed)
She tolerates radiation well. Continue supportive care.

## 2014-07-07 ENCOUNTER — Ambulatory Visit
Admission: RE | Admit: 2014-07-07 | Discharge: 2014-07-07 | Disposition: A | Discharge status: Home / Self Care | Payer: BC Managed Care – PPO | Source: Ambulatory Visit | Attending: Radiation Oncology | Admitting: Radiation Oncology

## 2014-07-07 ENCOUNTER — Telehealth: Payer: Self-pay | Admitting: Hematology and Oncology

## 2014-07-07 DIAGNOSIS — Z510 Encounter for antineoplastic radiation therapy: Secondary | ICD-10-CM | POA: Diagnosis not present

## 2014-07-07 NOTE — Telephone Encounter (Signed)
lvm for pt regarding to OCT appt..... °

## 2014-07-10 ENCOUNTER — Encounter: Payer: Self-pay | Admitting: Radiation Oncology

## 2014-07-10 ENCOUNTER — Ambulatory Visit: Payer: BC Managed Care – PPO

## 2014-07-10 ENCOUNTER — Telehealth: Payer: Self-pay | Admitting: Hematology and Oncology

## 2014-07-10 ENCOUNTER — Other Ambulatory Visit: Payer: Self-pay | Admitting: Hematology and Oncology

## 2014-07-10 ENCOUNTER — Ambulatory Visit
Admission: RE | Admit: 2014-07-10 | Discharge: 2014-07-10 | Disposition: A | Discharge status: Home / Self Care | Payer: BC Managed Care – PPO | Source: Ambulatory Visit | Attending: Radiation Oncology | Admitting: Radiation Oncology

## 2014-07-10 VITALS — BP 123/88 | HR 83 | Temp 99.0°F | Resp 12 | Wt 116.6 lb

## 2014-07-10 DIAGNOSIS — C100 Malignant neoplasm of vallecula: Secondary | ICD-10-CM

## 2014-07-10 DIAGNOSIS — Z510 Encounter for antineoplastic radiation therapy: Secondary | ICD-10-CM | POA: Diagnosis not present

## 2014-07-10 MED ORDER — BIAFINE EX EMUL
Freq: Two times a day (BID) | CUTANEOUS | Status: DC
  Administered 2014-07-10: 11:00:00 via TOPICAL

## 2014-07-10 NOTE — Telephone Encounter (Signed)
lvm for pt about added lab for 10.6.

## 2014-07-10 NOTE — Progress Notes (Signed)
   Weekly Management Note:  outpatient    ICD-9-CM ICD-10-CM   1. Carcinoma of vallecula epiglottica 146.3 C10.0 topical emolient (BIAFINE) emulsion    Current Dose:  60 Gy  Projected Dose: 70 Gy   Narrative:  The patient presents for routine under treatment assessment.  CBCT/MVCT images/Port film x-rays were reviewed.  The chart was checked. She had her trach removed.  Vomited this AM but denies lightheadedness.  Hard of hearing.  Pain controlled relatively well  Physical Findings:  weight is 116 lb 9.6 oz (52.889 kg). Her oral temperature is 99 F (37.2 C). Her blood pressure is 123/88 and her pulse is 83. Her respiration is 12 and oxygen saturation is 99%.  NAD, pharynx erythematous with patchy mucositis.  Skin over neck - flaky, dry.  CBC    Component Value Date/Time   WBC 4.9 06/14/2014 1227   WBC 11.3* 05/22/2014 0310   RBC 3.67* 06/14/2014 1227   RBC 3.65* 05/22/2014 0310   HGB 11.1* 06/14/2014 1227   HGB 11.2* 05/22/2014 0310   HCT 33.7* 06/14/2014 1227   HCT 33.1* 05/22/2014 0310   PLT 375 06/14/2014 1227   PLT 309 05/22/2014 0310   MCV 91.8 06/14/2014 1227   MCV 90.7 05/22/2014 0310   MCH 30.2 06/14/2014 1227   MCH 30.7 05/22/2014 0310   MCHC 32.9 06/14/2014 1227   MCHC 33.8 05/22/2014 0310   RDW 14.7* 06/14/2014 1227   RDW 13.4 05/22/2014 0310   LYMPHSABS 1.2 06/14/2014 1227   LYMPHSABS 2.9 04/14/2014 1020   MONOABS 0.9 06/14/2014 1227   MONOABS 1.6* 04/14/2014 1020   EOSABS 0.1 06/14/2014 1227   EOSABS 0.2 04/14/2014 1020   BASOSABS 0.0 06/14/2014 1227   BASOSABS 0.1 04/14/2014 1020     CMP     Component Value Date/Time   NA 137 06/16/2014 1046   NA 140 05/23/2014 0430   K 4.7 06/16/2014 1046   K 4.2 05/23/2014 0430   CL 100 05/23/2014 0430   CO2 27 06/16/2014 1046   CO2 31 05/23/2014 0430   GLUCOSE 99 06/16/2014 1046   GLUCOSE 107* 05/23/2014 0430   BUN 17.8 06/16/2014 1046   BUN 13 05/23/2014 0430   CREATININE 0.7 06/16/2014 1046   CREATININE 0.62 05/23/2014 0430   CALCIUM 9.2 06/16/2014 1046    CALCIUM 9.7 05/23/2014 0430   PROT 6.9 06/16/2014 1046   PROT 5.6* 05/22/2014 0310   ALBUMIN 3.0* 06/16/2014 1046   ALBUMIN 2.2* 05/22/2014 0310   AST 13 06/16/2014 1046   AST 12 05/22/2014 0310   ALT 6 06/16/2014 1046   ALT 9 05/22/2014 0310   ALKPHOS 84 06/16/2014 1046   ALKPHOS 77 05/22/2014 0310   BILITOT <0.20 06/16/2014 1046   BILITOT 0.2* 05/22/2014 0310   GFRNONAA >90 05/23/2014 0430   GFRAA >90 05/23/2014 0430     Impression:  The patient is tolerating radiotherapy.   Plan:  Continue radiotherapy as planned.checked with medical oncology to see if they want more up to date labs.  Chemotherapy has been discontinued.  She is not orthostatic.  Discussed signs of dehydration to warrant calling cancer centers. Improve Biafine compliance for skin to TID  -----------------------------------  Eppie Gibson, MD

## 2014-07-10 NOTE — Progress Notes (Signed)
She is currently in no pain. Pt complains of sore throat, Fever, Fatigue, Generalized Weakness and Poor Appetite.  Pt presenting appropriate quality, quantity and organization of sentences, faint. Pt has had dysphagia for both solids and liquids. The patient does not eat regular meals due to lack of appetite. Receives feedings via GT, 5 cans a day .Marland Kitchen Oral exam reveals mucous membranes moist, pharynx normal without lesions, noted a large amount of mucous in the posterior area of throat. Skin hyperpigmentation -  neck, erythema - neck, dry desquamation over neck. Reports applying Biafine as directed. Noted pt having a difficult time hearing during our conversation, she turns her left ear towards me and ask me to repeat several times.  She reports this is new since radiation treatment. Trach stoma site closure is healing, noted a small amount of moist yellow drainage from site.  Pt presenting with a moist productive cough, sputum is yellow.

## 2014-07-11 ENCOUNTER — Ambulatory Visit: Payer: BC Managed Care – PPO

## 2014-07-11 ENCOUNTER — Telehealth: Payer: Self-pay | Admitting: *Deleted

## 2014-07-11 ENCOUNTER — Other Ambulatory Visit: Payer: Self-pay

## 2014-07-11 NOTE — Telephone Encounter (Signed)
Notified by RTT that patient did not keep her appt today, they were unable to reach her or her son by phone.  I LVM with patient ca. [phone] with request to return my call.  Reached her son who stated she took him to [facility] ED last evening, they did not leave until about 0330.  He expressed she was probably too tired to keep appt.  I asked that when he speaks with her that he encourage her to keep appt tomorrow.  He verbalized understanding.  Gayleen Orem, RN, BSN, [place] at [place] [phone]

## 2014-07-12 ENCOUNTER — Ambulatory Visit: Payer: BC Managed Care – PPO | Admitting: Radiation Oncology

## 2014-07-12 ENCOUNTER — Ambulatory Visit
Admission: RE | Admit: 2014-07-12 | Discharge: 2014-07-12 | Disposition: A | Discharge status: Home / Self Care | Payer: BC Managed Care – PPO | Source: Ambulatory Visit | Attending: Radiation Oncology | Admitting: Radiation Oncology

## 2014-07-12 DIAGNOSIS — Z510 Encounter for antineoplastic radiation therapy: Secondary | ICD-10-CM | POA: Diagnosis not present

## 2014-07-13 ENCOUNTER — Encounter: Payer: Self-pay | Admitting: *Deleted

## 2014-07-13 ENCOUNTER — Ambulatory Visit
Admission: RE | Admit: 2014-07-13 | Discharge: 2014-07-13 | Disposition: A | Discharge status: Home / Self Care | Payer: BC Managed Care – PPO | Source: Ambulatory Visit | Attending: Radiation Oncology | Admitting: Radiation Oncology

## 2014-07-13 ENCOUNTER — Ambulatory Visit: Payer: BC Managed Care – PPO

## 2014-07-13 DIAGNOSIS — Z510 Encounter for antineoplastic radiation therapy: Secondary | ICD-10-CM | POA: Diagnosis not present

## 2014-07-14 ENCOUNTER — Ambulatory Visit: Payer: BC Managed Care – PPO

## 2014-07-14 ENCOUNTER — Telehealth: Payer: Self-pay | Admitting: Hematology and Oncology

## 2014-07-14 ENCOUNTER — Ambulatory Visit (HOSPITAL_BASED_OUTPATIENT_CLINIC_OR_DEPARTMENT_OTHER): Payer: BC Managed Care – PPO | Admitting: Hematology and Oncology

## 2014-07-14 ENCOUNTER — Ambulatory Visit
Admission: RE | Admit: 2014-07-14 | Discharge: 2014-07-14 | Disposition: A | Discharge status: Home / Self Care | Payer: BC Managed Care – PPO | Source: Ambulatory Visit | Attending: Radiation Oncology | Admitting: Radiation Oncology

## 2014-07-14 ENCOUNTER — Encounter: Payer: Self-pay | Admitting: Hematology and Oncology

## 2014-07-14 ENCOUNTER — Other Ambulatory Visit (HOSPITAL_BASED_OUTPATIENT_CLINIC_OR_DEPARTMENT_OTHER): Payer: BC Managed Care – PPO

## 2014-07-14 VITALS — BP 109/75 | HR 70 | Temp 98.3°F | Resp 17 | Ht 65.0 in | Wt 118.1 lb

## 2014-07-14 DIAGNOSIS — C109 Malignant neoplasm of oropharynx, unspecified: Secondary | ICD-10-CM

## 2014-07-14 DIAGNOSIS — Z931 Gastrostomy status: Secondary | ICD-10-CM

## 2014-07-14 DIAGNOSIS — R070 Pain in throat: Secondary | ICD-10-CM

## 2014-07-14 DIAGNOSIS — C140 Malignant neoplasm of pharynx, unspecified: Secondary | ICD-10-CM

## 2014-07-14 DIAGNOSIS — Z510 Encounter for antineoplastic radiation therapy: Secondary | ICD-10-CM | POA: Diagnosis not present

## 2014-07-14 DIAGNOSIS — R059 Cough, unspecified: Secondary | ICD-10-CM

## 2014-07-14 DIAGNOSIS — R110 Nausea: Secondary | ICD-10-CM

## 2014-07-14 DIAGNOSIS — R05 Cough: Secondary | ICD-10-CM

## 2014-07-14 DIAGNOSIS — E43 Unspecified severe protein-calorie malnutrition: Secondary | ICD-10-CM

## 2014-07-14 LAB — COMPREHENSIVE METABOLIC PANEL (CC13)
ALBUMIN: 3.1 g/dL — AB (ref 3.5–5.0)
ALT: 7 U/L (ref 0–55)
AST: 11 U/L (ref 5–34)
Alkaline Phosphatase: 76 U/L (ref 40–150)
Anion Gap: 6 mEq/L (ref 3–11)
BUN: 18.8 mg/dL (ref 7.0–26.0)
CALCIUM: 9.7 mg/dL (ref 8.4–10.4)
CHLORIDE: 100 meq/L (ref 98–109)
CO2: 31 mEq/L — ABNORMAL HIGH (ref 22–29)
Creatinine: 0.7 mg/dL (ref 0.6–1.1)
Glucose: 93 mg/dl (ref 70–140)
POTASSIUM: 4.8 meq/L (ref 3.5–5.1)
Sodium: 137 mEq/L (ref 136–145)
TOTAL PROTEIN: 6.6 g/dL (ref 6.4–8.3)
Total Bilirubin: 0.2 mg/dL (ref 0.20–1.20)

## 2014-07-14 LAB — CBC WITH DIFFERENTIAL/PLATELET
BASO%: 0.5 % (ref 0.0–2.0)
Basophils Absolute: 0 10*3/uL (ref 0.0–0.1)
EOS ABS: 0.1 10*3/uL (ref 0.0–0.5)
EOS%: 2.1 % (ref 0.0–7.0)
HCT: 29.9 % — ABNORMAL LOW (ref 34.8–46.6)
HGB: 9.8 g/dL — ABNORMAL LOW (ref 11.6–15.9)
LYMPH%: 14.6 % (ref 14.0–49.7)
MCH: 30.5 pg (ref 25.1–34.0)
MCHC: 32.8 g/dL (ref 31.5–36.0)
MCV: 93.1 fL (ref 79.5–101.0)
MONO#: 0.7 10*3/uL (ref 0.1–0.9)
MONO%: 15.1 % — ABNORMAL HIGH (ref 0.0–14.0)
NEUT%: 67.7 % (ref 38.4–76.8)
NEUTROS ABS: 3 10*3/uL (ref 1.5–6.5)
PLATELETS: 281 10*3/uL (ref 145–400)
RBC: 3.21 10*6/uL — ABNORMAL LOW (ref 3.70–5.45)
RDW: 18.3 % — ABNORMAL HIGH (ref 11.2–14.5)
WBC: 4.4 10*3/uL (ref 3.9–10.3)
lymph#: 0.6 10*3/uL — ABNORMAL LOW (ref 0.9–3.3)

## 2014-07-14 LAB — MAGNESIUM (CC13): Magnesium: 2.2 mg/dl (ref 1.5–2.5)

## 2014-07-14 MED ORDER — FENTANYL 50 MCG/HR TD PT72: 50.0000 ug | MEDICATED_PATCH | TRANSDERMAL | Status: DC

## 2014-07-14 NOTE — Assessment & Plan Note (Signed)
This is currently well controlled 

## 2014-07-14 NOTE — Assessment & Plan Note (Signed)
This has improved. Continue observation.

## 2014-07-14 NOTE — Assessment & Plan Note (Signed)
She tolerates radiation well. Continue supportive care.

## 2014-07-14 NOTE — Assessment & Plan Note (Signed)
The feeding tube looks fine with no infection

## 2014-07-14 NOTE — Assessment & Plan Note (Signed)
She continues to use nutritional feeding through feeding tube. She has not lost any weight recently.

## 2014-07-14 NOTE — Assessment & Plan Note (Signed)
Her pain appears to be well-controlled with current pain medicine. I refilled her prescription fentanyl patch today.

## 2014-07-14 NOTE — Telephone Encounter (Signed)
pt sent back to lab and given schedule for oct.

## 2014-07-14 NOTE — Progress Notes (Signed)
[place] OFFICE PROGRESS NOTE  Patient Care Team: Jonathon Bellows, MD as PCP - General (Family Medicine) Heather [place], [place] as Consulting Physician (Optometry) Brooks Sailors, RN as Oncology Nurse Navigator (Oncology) Heath Lark, MD as Consulting Physician (Hematology and Oncology) Eppie Gibson, MD as Attending Physician (Radiation Oncology) Karie Mainland, RD as Dietitian (Nutrition)  SUMMARY OF ONCOLOGIC HISTORY: Oncology History   Carcinoma of vallecula epiglottica Oropharyngeal cancer   Primary site: Pharynx - Oropharynx   Staging method: AJCC 7th Edition   Clinical: Stage IVA (T3, N2c, M0) signed by Eppie Gibson, MD on 04/20/2014  3:33 PM   Summary: Stage IVA (T3, N2c, M0)       Oropharyngeal cancer   04/14/2014 Imaging CT scan of the neck showed oropharyngeal mass.   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   04/14/2014 Surgery Laryngoscopy of the lower tongue base, vallecula, and lingual surface of the epiglottis showed a fungating irregular mass   04/26/2014 Imaging PET/CT scan showed bilateral lymphadenopathy   04/27/2014 Imaging Barium swallow shows severe dysphagia.   05/18/2014 - 05/22/2014 Hospital Admission The patient was admitted to the hospital for tracheostomy, placement of port and feeding tube.   05/24/2014 - 06/16/2014 Chemotherapy She is started on high dose cisplatin. She only received 2 doses due to intolerable side effects and patient has made an informed decision not to pursue a final dose of chemotherapy   05/24/2014 -  Radiation Therapy She is started on radiation treatment    INTERVAL HISTORY: Please see below for problem oriented charting. She feels slightly better today. She missed recent appointment you to nausea and feeling unwell. Her pain appears to be under control. She has some mild nausea but no vomiting.  REVIEW OF SYSTEMS:   Constitutional:  Denies fevers, chills or abnormal weight loss Eyes: Denies blurriness of vision Respiratory: Denies cough, dyspnea or wheezes Cardiovascular: Denies palpitation, chest discomfort or lower extremity swelling Skin: Denies abnormal skin rashes Lymphatics: Denies new lymphadenopathy or easy bruising Neurological:Denies numbness, tingling or new weaknesses Behavioral/Psych: Mood is stable, no new changes  All other systems were reviewed with the patient and are negative.  I have reviewed the past medical history, past surgical history, social history and family history with the patient and they are unchanged from previous note.  ALLERGIES:  is allergic to penicillins.  MEDICATIONS:  Current Outpatient Prescriptions  Medication Sig Dispense Refill  . diazepam (VALIUM) 10 MG tablet Take 5 mg by mouth at bedtime.       . fentaNYL (DURAGESIC - DOSED MCG/HR) 50 MCG/HR Place 1 patch (50 mcg total) onto the skin every 3 (three) days.  5 patch  0  . HYDROcodone-homatropine (HYCODAN) 5-1.5 MG/5ML syrup Take 5 mLs by mouth every 4 (four) hours as needed for cough.  240 mL  0  . ipratropium-albuterol (DUONEB) 0.5-2.5 (3) MG/3ML SOLN Take 3 mLs by nebulization every 4 (four) hours as needed (for shortness of breath).       . lidocaine (XYLOCAINE) 2 % solution Mix 1 part 2% viscous lidocaine, 1 part H20. Swish and/or swallow 10 mL of this mixture, up to QID, to soothe throat.  100 mL  5  . lidocaine-prilocaine (EMLA) cream Apply 1 application topically as needed (for port/chemo).      Marland Kitchen metoCLOPramide (REGLAN) 10 MG tablet       . Nutritional Supplements (FEEDING SUPPLEMENT, JEVITY 1.2 CAL,) LIQD  Place 40 mL/hr into feeding tube continuous.      . ondansetron (ZOFRAN ODT) 8 MG disintegrating tablet Take 1 tablet (8 mg total) by mouth every 8 (eight) hours as needed for nausea or vomiting.  60 tablet  3  . PARoxetine (PAXIL) 20 MG tablet Take 20 mg by mouth at bedtime.      Marland Kitchen PHENADOZ 25 MG suppository        . polyethylene glycol (MIRALAX / GLYCOLAX) packet Take 17 g by mouth daily as needed for moderate constipation.      . sodium fluoride (FLUORISHIELD) 1.1 % GEL dental gel Instill one drop of fluoride per tooth space of fluoride tray. Place over teeth for 5 minutes. Remove. Spit out excess. Repeat nightly.  120 mL  prn  . sucralfate (CARAFATE) 1 G tablet Take 1 tablet (1 g total) by mouth 4 (four) times daily -  with meals and at bedtime. Dissolve in 10 mL H2O       No current facility-administered medications for this visit.    PHYSICAL EXAMINATION: ECOG PERFORMANCE STATUS: 1 - Symptomatic but completely ambulatory  Filed Vitals:   07/14/14 1222  BP: 109/75  Pulse: 70  Temp: 98.3 F (36.8 C)  Resp: 17   Filed Weights   07/14/14 1222  Weight: 118 lb 1.6 oz (53.57 kg)    GENERAL:alert, no distress and comfortable. She looks thin and mildly cachectic SKIN: No irradiation-induced dermatitis around her neck but no ulceration. EYES: normal, Conjunctiva are pink and non-injected, sclera clear OROPHARYNX:no exudate, no erythema and lips, buccal mucosa, and tongue normal . Mild mucositis but no thrush. NECK: supple, thyroid normal size, non-tender, without nodularity. Her tracheostomy site has healed. LYMPH:  no palpable lymphadenopathy in the cervical, axillary or inguinal LUNGS: clear to auscultation and percussion with normal breathing effort HEART: regular rate & rhythm and no murmurs and no lower extremity edema ABDOMEN:abdomen soft, non-tender and normal bowel sounds. Feeding tube site looks okay Musculoskeletal:no cyanosis of digits and no clubbing  NEURO: alert & oriented x 3 with fluent speech, no focal motor/sensory deficits  LABORATORY DATA:  I have reviewed the data as listed    Component Value Date/Time   NA 137 06/16/2014 1046   NA 140 05/23/2014 0430   K 4.7 06/16/2014 1046   K 4.2 05/23/2014 0430   CL 100 05/23/2014 0430   CO2 27 06/16/2014 1046   CO2 31 05/23/2014 0430    GLUCOSE 99 06/16/2014 1046   GLUCOSE 107* 05/23/2014 0430   BUN 17.8 06/16/2014 1046   BUN 13 05/23/2014 0430   CREATININE 0.7 06/16/2014 1046   CREATININE 0.62 05/23/2014 0430   CALCIUM 9.2 06/16/2014 1046   CALCIUM 9.7 05/23/2014 0430   PROT 6.9 06/16/2014 1046   PROT 5.6* 05/22/2014 0310   ALBUMIN 3.0* 06/16/2014 1046   ALBUMIN 2.2* 05/22/2014 0310   AST 13 06/16/2014 1046   AST 12 05/22/2014 0310   ALT 6 06/16/2014 1046   ALT 9 05/22/2014 0310   ALKPHOS 84 06/16/2014 1046   ALKPHOS 77 05/22/2014 0310   BILITOT <0.20 06/16/2014 1046   BILITOT 0.2* 05/22/2014 0310   GFRNONAA >90 05/23/2014 0430   GFRAA >90 05/23/2014 0430    No results found for this basename: SPEP, UPEP,  kappa and lambda light chains    Lab Results  Component Value Date   WBC 4.9 06/14/2014   NEUTROABS 2.8 06/14/2014   HGB 11.1* 06/14/2014   HCT 33.7* 06/14/2014  MCV 91.8 06/14/2014   PLT 375 06/14/2014      Chemistry      Component Value Date/Time   NA 137 06/16/2014 1046   NA 140 05/23/2014 0430   K 4.7 06/16/2014 1046   K 4.2 05/23/2014 0430   CL 100 05/23/2014 0430   CO2 27 06/16/2014 1046   CO2 31 05/23/2014 0430   BUN 17.8 06/16/2014 1046   BUN 13 05/23/2014 0430   CREATININE 0.7 06/16/2014 1046   CREATININE 0.62 05/23/2014 0430      Component Value Date/Time   CALCIUM 9.2 06/16/2014 1046   CALCIUM 9.7 05/23/2014 0430   ALKPHOS 84 06/16/2014 1046   ALKPHOS 77 05/22/2014 0310   AST 13 06/16/2014 1046   AST 12 05/22/2014 0310   ALT 6 06/16/2014 1046   ALT 9 05/22/2014 0310   BILITOT <0.20 06/16/2014 1046   BILITOT 0.2* 05/22/2014 0310     ASSESSMENT & PLAN:  Oropharyngeal cancer She tolerates radiation well. Continue supportive care.   Throat pain Her pain appears to be well-controlled with current pain medicine. I refilled her prescription fentanyl patch today.     Cough This has improved. Continue observation.  Nausea without vomiting This is currently well-controlled.  S/P percutaneous endoscopic gastrostomy  (PEG) tube placement The feeding tube looks fine with no infection   Protein-calorie malnutrition, severe She continues to use nutritional feeding through feeding tube. She has not lost any weight recently.     No orders of the defined types were placed in this encounter.   All questions were answered. The patient knows to call the clinic with any problems, questions or concerns. No barriers to learning was detected. I spent 25 minutes counseling the patient face to face. The total time spent in the appointment was 30 minutes and more than 50% was on counseling and review of test results     [facility], [place], MD 07/14/2014 12:50 PM

## 2014-07-16 NOTE — Progress Notes (Signed)
To provide support and encouragement, care continuity and to assess for needs, met with patient prior to Tomo tmt.  She reported: 1. intermittent N&V r/t mucous, indicated it is managed with anti-emetics.   2. trach stoma healing well, not having any problems since its removal. 3. looking forward to final RT next Tuesday. She denied any needs or additonal concerns; I encouraged her to contact me if that changes before I see her next, she verbalized agreement.  Gayleen Orem, RN, BSN, [place] at [place] [phone]

## 2014-07-17 ENCOUNTER — Ambulatory Visit: Payer: BC Managed Care – PPO

## 2014-07-17 ENCOUNTER — Encounter: Payer: Self-pay | Admitting: Radiation Oncology

## 2014-07-17 ENCOUNTER — Inpatient Hospital Stay
Admission: RE | Admit: 2014-07-17 | Discharge: 2014-07-17 | Disposition: A | Discharge status: Home / Self Care | Payer: Self-pay | Source: Ambulatory Visit | Attending: Radiation Oncology | Admitting: Radiation Oncology

## 2014-07-17 ENCOUNTER — Ambulatory Visit
Admission: RE | Admit: 2014-07-17 | Discharge: 2014-07-17 | Disposition: A | Discharge status: Home / Self Care | Payer: BC Managed Care – PPO | Source: Ambulatory Visit | Attending: Radiation Oncology | Admitting: Radiation Oncology

## 2014-07-17 VITALS — BP 109/68 | HR 72 | Temp 98.0°F | Ht 65.0 in | Wt 116.8 lb

## 2014-07-17 DIAGNOSIS — Z510 Encounter for antineoplastic radiation therapy: Secondary | ICD-10-CM | POA: Diagnosis not present

## 2014-07-17 DIAGNOSIS — C100 Malignant neoplasm of vallecula: Secondary | ICD-10-CM

## 2014-07-17 NOTE — Progress Notes (Addendum)
Stacey Carroll has received 34/35 fractions.  She reports soreness of a level 6/10 whenever she swallows.  [name] was removed 2 weeks ago and the stoma is closed with a small scab.  Neck red with dryness and hyperpigmentation on the upper shoulder and back regions,  but remains intact.  Note mild swelling at the base of her right neck.  Note whitish area in the hard palate region, but otherwise mucosa clear and moist.  She denies any dry mouth.  She continue on Jevity enteral nutrition of 5 cans daily. Maintaining weight as seen by PUT visit on 07/10/14.

## 2014-07-17 NOTE — Progress Notes (Signed)
Weekly Management Note:  Site: Vallecula/neck Current Dose:  6800  cGy Projected Dose: 7000  cGy  Narrative: The patient is seen today for routine under treatment assessment. CBCT/MVCT images/port films were reviewed. The chart was reviewed.   She is without new complaints today. She still has discomfort on swallowing which is reasonably well controlled with a Duragesic 50 mcg patch. She is not taking any pain medication for breakthrough pain.  Physical Examination:  Filed Vitals:   07/17/14 1308  BP: 109/68  Pulse: 72  Temp: 98 F (36.7 C)  .  Weight: 116 lb 12.8 oz (52.98 kg). There is erythema and hyperpigmentation along the neck with patchy dry desquamation. Oral cavity and oropharynx are unremarkable although there is a clear cyst adjacent to her uvula. No Candida. Indirect mirror examination not performed today.  Impression: Tolerating radiation therapy well. She'll finish her radiation therapy tomorrow.  Plan: Continue radiation therapy as planned. Follow visit in one month with Dr. Isidore Moos after completion of radiation therapy.

## 2014-07-18 ENCOUNTER — Encounter: Payer: Self-pay | Admitting: Radiation Oncology

## 2014-07-18 ENCOUNTER — Ambulatory Visit: Payer: BC Managed Care – PPO

## 2014-07-18 ENCOUNTER — Encounter: Payer: Self-pay | Admitting: *Deleted

## 2014-07-18 ENCOUNTER — Ambulatory Visit
Admission: RE | Admit: 2014-07-18 | Discharge: 2014-07-18 | Disposition: A | Discharge status: Home / Self Care | Payer: BC Managed Care – PPO | Source: Ambulatory Visit | Attending: Radiation Oncology | Admitting: Radiation Oncology

## 2014-07-18 DIAGNOSIS — Z510 Encounter for antineoplastic radiation therapy: Secondary | ICD-10-CM | POA: Diagnosis not present

## 2014-07-18 NOTE — Progress Notes (Signed)
Met with Stefanee and her son during final RT to offer support and to celebrate end of radiation treatment.  I provided her son a Certificate acknowledging his contribution to his mother's care during her treatments.  I explained that my role as navigator will continue for several more months and that I will be calling and/or joining them during follow-up visits.  They expressed understanding.  Rick Diehl, RN, BSN, CHPN Head & Neck Oncology Navigator [facility] at [place] [phone]  

## 2014-07-22 NOTE — Progress Notes (Signed)
  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 Her trach was removed during the tail end of her course of ChRT and she was able to breath without obstruction. She had issues with nausea, coughing, sore throat, nutrition, dysphagia, dehydration, skin irritation, all of which were addressed with supportive care.  Plan: The patient has completed radiation treatment. The patient will return to radiation oncology clinic for routine followup in one half month. I advised them to call or return sooner if they have any questions or concerns related to their recovery or treatment.  -----------------------------------  Eppie Gibson, MD

## 2014-07-24 ENCOUNTER — Ambulatory Visit (HOSPITAL_BASED_OUTPATIENT_CLINIC_OR_DEPARTMENT_OTHER): Payer: BC Managed Care – PPO | Admitting: Hematology and Oncology

## 2014-07-24 ENCOUNTER — Encounter: Payer: Self-pay | Admitting: Hematology and Oncology

## 2014-07-24 VITALS — BP 124/81 | HR 77 | Temp 98.0°F | Resp 20 | Ht 65.0 in | Wt 114.7 lb

## 2014-07-24 DIAGNOSIS — R059 Cough, unspecified: Secondary | ICD-10-CM

## 2014-07-24 DIAGNOSIS — E43 Unspecified severe protein-calorie malnutrition: Secondary | ICD-10-CM

## 2014-07-24 DIAGNOSIS — R1310 Dysphagia, unspecified: Secondary | ICD-10-CM

## 2014-07-24 DIAGNOSIS — R070 Pain in throat: Secondary | ICD-10-CM

## 2014-07-24 DIAGNOSIS — C109 Malignant neoplasm of oropharynx, unspecified: Secondary | ICD-10-CM

## 2014-07-24 DIAGNOSIS — R110 Nausea: Secondary | ICD-10-CM

## 2014-07-24 DIAGNOSIS — R05 Cough: Secondary | ICD-10-CM

## 2014-07-24 DIAGNOSIS — Z931 Gastrostomy status: Secondary | ICD-10-CM

## 2014-07-24 MED ORDER — METOCLOPRAMIDE HCL 10 MG PO TABS: 10.0000 mg | ORAL_TABLET | Freq: Three times a day (TID) | ORAL | Status: DC

## 2014-07-24 NOTE — Assessment & Plan Note (Signed)
Her nausea is well controlled with Reglan before meals. Recently, her prescription ran out. I refilled her prescription.

## 2014-07-24 NOTE — Assessment & Plan Note (Signed)
The feeding tube looks fine with no infection

## 2014-07-24 NOTE — Assessment & Plan Note (Signed)
She has significant dysphagia. I will refer her to speech and language therapist for assessment.

## 2014-07-24 NOTE — Assessment & Plan Note (Signed)
She has persistent weight loss. She felt that this is because of recent nausea. She will continue on nutritional supplement through her feeding tube.

## 2014-07-24 NOTE — Assessment & Plan Note (Signed)
Her pain appears to be well-controlled with current pain medicine. I plan to reduce prescription fentanyl patch at 25 mcg in the future once her current prescription runs out.

## 2014-07-24 NOTE — Assessment & Plan Note (Signed)
She tolerates treatment well. Continue supportive care. Repeat PET/CT scan in 4 months. Clinically, she has no residual disease

## 2014-07-24 NOTE — Progress Notes (Signed)
[facility] OFFICE PROGRESS NOTE  Patient Care Team: Jonathon Bellows, MD as PCP - General (Family Medicine) Heather [place], [place] as Consulting Physician (Optometry) Brooks Sailors, RN as Oncology Nurse Navigator (Oncology) Heath Lark, MD as Consulting Physician (Hematology and Oncology) Eppie Gibson, MD as Attending Physician (Radiation Oncology) Karie Mainland, RD as Dietitian (Nutrition)  SUMMARY OF ONCOLOGIC HISTORY: Oncology History   Carcinoma of vallecula epiglottica Oropharyngeal cancer   Primary site: Pharynx - Oropharynx   Staging method: AJCC 7th Edition   Clinical: Stage IVA (T3, N2c, M0) signed by Eppie Gibson, MD on 04/20/2014  3:33 PM   Summary: Stage IVA (T3, N2c, M0)       Oropharyngeal cancer   04/14/2014 Imaging CT scan of the neck showed oropharyngeal mass.   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   04/14/2014 Surgery Laryngoscopy of the lower tongue base, vallecula, and lingual surface of the epiglottis showed a fungating irregular mass   04/26/2014 Imaging PET/CT scan showed bilateral lymphadenopathy   04/27/2014 Imaging Barium swallow shows severe dysphagia.   05/18/2014 - 05/22/2014 Hospital Admission The patient was admitted to the hospital for tracheostomy, placement of port and feeding tube.   05/24/2014 - 06/16/2014 Chemotherapy She is started on high dose cisplatin. She only received 2 doses due to intolerable side effects and patient has made an informed decision not to pursue a final dose of chemotherapy   05/24/2014 - 07/18/2014 Radiation Therapy She is started on radiation treatment    Carcinoma of vallecula epiglottica   04/20/2014 Initial Diagnosis Carcinoma of vallecula epiglottica    INTERVAL HISTORY: Please see below for problem oriented charting. She is seen as part of her supportive care visit. She ran out of her prescription refill on last week  and started to have nausea. She had one episode of vomiting. She had lost some weight. She had persistent nonproductive cough related to irritation in the back of her throat. Her pain appears to be well controlled. She complained of dysphagia and unable to swallow pills. She is able to tolerate water only.  REVIEW OF SYSTEMS:   Eyes: Denies blurriness of vision Cardiovascular: Denies palpitation, chest discomfort or lower extremity swelling Skin: Denies abnormal skin rashes Lymphatics: Denies new lymphadenopathy or easy bruising Neurological:Denies numbness, tingling or new weaknesses Behavioral/Psych: Mood is stable, no new changes  All other systems were reviewed with the patient and are negative.  I have reviewed the past medical history, past surgical history, social history and family history with the patient and they are unchanged from previous note.  ALLERGIES:  is allergic to penicillins.  MEDICATIONS:  Current Outpatient Prescriptions  Medication Sig Dispense Refill  . diazepam (VALIUM) 10 MG tablet Take 5 mg by mouth at bedtime.       . fentaNYL (DURAGESIC - DOSED MCG/HR) 50 MCG/HR Place 1 patch (50 mcg total) onto the skin every 3 (three) days.  5 patch  0  . HYDROcodone-homatropine (HYCODAN) 5-1.5 MG/5ML syrup Take 5 mLs by mouth every 4 (four) hours as needed for cough.  240 mL  0  . ipratropium-albuterol (DUONEB) 0.5-2.5 (3) MG/3ML SOLN Take 3 mLs by nebulization every 4 (four) hours as needed (for shortness of breath).       . lidocaine (XYLOCAINE) 2 % solution Mix 1 part 2% viscous lidocaine, 1 part H20. Swish and/or swallow 10 mL of this mixture, up to QID, to soothe  throat.  100 mL  5  . lidocaine-prilocaine (EMLA) cream Apply 1 application topically as needed (for port/chemo).      Marland Kitchen metoCLOPramide (REGLAN) 10 MG tablet Take 1 tablet (10 mg total) by mouth 4 (four) times daily -  before meals and at bedtime.  90 tablet  1  . Nutritional Supplements (FEEDING SUPPLEMENT,  JEVITY 1.2 CAL,) LIQD Place 40 mL/hr into feeding tube continuous.      . ondansetron (ZOFRAN ODT) 8 MG disintegrating tablet Take 1 tablet (8 mg total) by mouth every 8 (eight) hours as needed for nausea or vomiting.  60 tablet  3  . PARoxetine (PAXIL) 20 MG tablet Take 20 mg by mouth at bedtime.      . polyethylene glycol (MIRALAX / GLYCOLAX) packet Take 17 g by mouth daily as needed for moderate constipation.      . sodium fluoride (FLUORISHIELD) 1.1 % GEL dental gel Instill one drop of fluoride per tooth space of fluoride tray. Place over teeth for 5 minutes. Remove. Spit out excess. Repeat nightly.  120 mL  prn  . sucralfate (CARAFATE) 1 G tablet Take 1 tablet (1 g total) by mouth 4 (four) times daily -  with meals and at bedtime. Dissolve in 10 mL H2O       No current facility-administered medications for this visit.    PHYSICAL EXAMINATION: ECOG PERFORMANCE STATUS: 1 - Symptomatic but completely ambulatory  Filed Vitals:   07/24/14 1004  BP: 124/81  Pulse: 77  Temp: 98 F (36.7 C)  Resp: 20   Filed Weights   07/24/14 1004  Weight: 114 lb 11.2 oz (52.028 kg)    GENERAL:alert, no distress and comfortable. She looks thin and mildly cachectic  SKIN: skin color, texture, turgor are normal, no rashes or significant lesions. Prior radiation dermatitis has resolved EYES: normal, Conjunctiva are pink and non-injected, sclera clear OROPHARYNX: Noted dry mouth and persistent mucositis. No thrush. NECK: supple, thyroid normal size, non-tender, without nodularity LYMPH:  no palpable lymphadenopathy in the cervical, axillary or inguinal LUNGS: clear to auscultation and percussion with normal breathing effort HEART: regular rate & rhythm and no murmurs and no lower extremity edema ABDOMEN:abdomen soft, non-tender and normal bowel sounds. Feeding tube site looks okay Musculoskeletal:no cyanosis of digits and no clubbing  NEURO: alert & oriented x 3 with fluent speech, no focal  motor/sensory deficits  LABORATORY DATA:  I have reviewed the data as listed    Component Value Date/Time   NA 137 07/14/2014 1246   NA 140 05/23/2014 0430   K 4.8 07/14/2014 1246   K 4.2 05/23/2014 0430   CL 100 05/23/2014 0430   CO2 31* 07/14/2014 1246   CO2 31 05/23/2014 0430   GLUCOSE 93 07/14/2014 1246   GLUCOSE 107* 05/23/2014 0430   BUN 18.8 07/14/2014 1246   BUN 13 05/23/2014 0430   CREATININE 0.7 07/14/2014 1246   CREATININE 0.62 05/23/2014 0430   CALCIUM 9.7 07/14/2014 1246   CALCIUM 9.7 05/23/2014 0430   PROT 6.6 07/14/2014 1246   PROT 5.6* 05/22/2014 0310   ALBUMIN 3.1* 07/14/2014 1246   ALBUMIN 2.2* 05/22/2014 0310   AST 11 07/14/2014 1246   AST 12 05/22/2014 0310   ALT 7 07/14/2014 1246   ALT 9 05/22/2014 0310   ALKPHOS 76 07/14/2014 1246   ALKPHOS 77 05/22/2014 0310   BILITOT 0.20 07/14/2014 1246   BILITOT 0.2* 05/22/2014 0310   GFRNONAA >90 05/23/2014 0430   GFRAA >90 05/23/2014 0430  No results found for this basename: SPEP, UPEP,  kappa and lambda light chains    Lab Results  Component Value Date   WBC 4.4 07/14/2014   NEUTROABS 3.0 07/14/2014   HGB 9.8* 07/14/2014   HCT 29.9* 07/14/2014   MCV 93.1 07/14/2014   PLT 281 07/14/2014      Chemistry      Component Value Date/Time   NA 137 07/14/2014 1246   NA 140 05/23/2014 0430   K 4.8 07/14/2014 1246   K 4.2 05/23/2014 0430   CL 100 05/23/2014 0430   CO2 31* 07/14/2014 1246   CO2 31 05/23/2014 0430   BUN 18.8 07/14/2014 1246   BUN 13 05/23/2014 0430   CREATININE 0.7 07/14/2014 1246   CREATININE 0.62 05/23/2014 0430      Component Value Date/Time   CALCIUM 9.7 07/14/2014 1246   CALCIUM 9.7 05/23/2014 0430   ALKPHOS 76 07/14/2014 1246   ALKPHOS 77 05/22/2014 0310   AST 11 07/14/2014 1246   AST 12 05/22/2014 0310   ALT 7 07/14/2014 1246   ALT 9 05/22/2014 0310   BILITOT 0.20 07/14/2014 1246   BILITOT 0.2* 05/22/2014 0310      ASSESSMENT & PLAN:  Oropharyngeal cancer She tolerates treatment well. Continue supportive  care. Repeat PET/CT scan in 4 months. Clinically, she has no residual disease     Throat pain Her pain appears to be well-controlled with current pain medicine. I plan to reduce prescription fentanyl patch at 25 mcg in the future once her current prescription runs out.  Protein-calorie malnutrition, severe She has persistent weight loss. She felt that this is because of recent nausea. She will continue on nutritional supplement through her feeding tube.  S/P percutaneous endoscopic gastrostomy (PEG) tube placement The feeding tube looks fine with no infection  Nausea without vomiting Her nausea is well controlled with Reglan before meals. Recently, her prescription ran out. I refilled her prescription.  Cough Her history suggest this could be related to nasal drainage from the oropharynx. I suspect this is due related to recent treatment side effects. Clinically, she does not appear to be infected.  Dysphagia She has significant dysphagia. I will refer her to speech and language therapist for assessment.   Orders Placed This Encounter  Procedures  . Ambulatory referral to Speech Therapy    Referral Priority:  Routine    Referral Type:  Speech Therapy    Referral Reason:  Specialty Services Required    Requested Specialty:  Speech Pathology    Number of Visits Requested:  1   All questions were answered. The patient knows to call the clinic with any problems, questions or concerns. No barriers to learning was detected. I spent 25 minutes counseling the patient face to face. The total time spent in the appointment was 30 minutes and more than 50% was on counseling and review of test results     [facility], [place], MD 07/24/2014 11:03 AM

## 2014-07-24 NOTE — Assessment & Plan Note (Signed)
Her history suggest this could be related to nasal drainage from the oropharynx. I suspect this is due related to recent treatment side effects. Clinically, she does not appear to be infected.

## 2014-07-25 ENCOUNTER — Telehealth: Payer: Self-pay | Admitting: Hematology and Oncology

## 2014-07-25 NOTE — Telephone Encounter (Signed)
Stacey Carroll and advised on all appt...Carroll sched to see Dr. Glendell Docker on 10.26 @ 2:45pm

## 2014-07-28 ENCOUNTER — Telehealth: Payer: Self-pay | Admitting: *Deleted

## 2014-07-28 ENCOUNTER — Encounter: Payer: Self-pay | Admitting: Radiation Oncology

## 2014-07-28 NOTE — Telephone Encounter (Signed)
Called patient to check on her well being.  LVM, encouraged her to call me back.  Gayleen Orem, RN, BSN, [place] [facility] [phone]

## 2014-07-31 ENCOUNTER — Telehealth: Payer: Self-pay | Admitting: *Deleted

## 2014-07-31 ENCOUNTER — Ambulatory Visit: Payer: BC Managed Care – PPO

## 2014-07-31 ENCOUNTER — Ambulatory Visit: Payer: BC Managed Care – PPO | Attending: Hematology and Oncology

## 2014-07-31 ENCOUNTER — Encounter: Payer: Self-pay | Admitting: Radiation Oncology

## 2014-07-31 ENCOUNTER — Ambulatory Visit
Admission: RE | Admit: 2014-07-31 | Discharge: 2014-07-31 | Disposition: A | Discharge status: Home / Self Care | Payer: BC Managed Care – PPO | Source: Ambulatory Visit | Attending: Radiation Oncology | Admitting: Radiation Oncology

## 2014-07-31 VITALS — BP 142/92 | HR 68 | Temp 97.7°F | Ht 65.0 in | Wt 115.6 lb

## 2014-07-31 DIAGNOSIS — Z931 Gastrostomy status: Secondary | ICD-10-CM | POA: Diagnosis not present

## 2014-07-31 DIAGNOSIS — C100 Malignant neoplasm of vallecula: Secondary | ICD-10-CM

## 2014-07-31 DIAGNOSIS — Z5189 Encounter for other specified aftercare: Secondary | ICD-10-CM | POA: Insufficient documentation

## 2014-07-31 DIAGNOSIS — R1310 Dysphagia, unspecified: Secondary | ICD-10-CM | POA: Insufficient documentation

## 2014-07-31 DIAGNOSIS — C099 Malignant neoplasm of tonsil, unspecified: Secondary | ICD-10-CM | POA: Insufficient documentation

## 2014-07-31 HISTORY — DX: Personal history of irradiation: Z92.3

## 2014-07-31 NOTE — Progress Notes (Signed)
Ms. Munford has returned for assessment s/p radiation therapy vallecula and neck.  She denies any sore throat today and her oral mucosa is slight moist without any signs of irritation.  Skin on neck is dry, but without any desquamation.  She is exclusively obtaining nutritional support via her PEG tube and is tolerating with occasional periods of nausea.  She states that she "dreams about eating and wakes up chewing."   Maintaining weight.

## 2014-07-31 NOTE — Telephone Encounter (Signed)
CALLED PATIENT TO INFORM OF TEST AND FU, LVM FOR A RETURN CALL 

## 2014-07-31 NOTE — Progress Notes (Signed)
Radiation Oncology         [phone] ________________________________  Name: Stacey Carroll MRN: [id number]  Date: 07/31/2014  DOB: 31-Mar-1961  Follow-Up Visit Note  CC: Jonathon Bellows, MD  Ruby Cola, MD  Diagnosis and Prior Radiotherapy:       ICD-9-CM ICD-10-CM  1. 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  She denies any sore throat today. Skin on neck is dry, but without any desquamation. Not remembering to use Biafine. She is exclusively obtaining nutritional support via her PEG tube and is tolerating with occasional periods of nausea. Drinking water. She hopes to be able to eat soon. Maintaining weight. Overall, she feels significantly better. She has an appt with Glendell Docker of [facility] today.                         ALLERGIES:  is allergic to penicillins.  Meds: Current Outpatient Prescriptions  Medication Sig Dispense Refill  . diazepam (VALIUM) 10 MG tablet Take 5 mg by mouth at bedtime.       . fentaNYL (DURAGESIC - DOSED MCG/HR) 50 MCG/HR Place 1 patch (50 mcg total) onto the skin every 3 (three) days.  5 patch  0  . HYDROcodone-homatropine (HYCODAN) 5-1.5 MG/5ML syrup Take 5 mLs by mouth every 4 (four) hours as needed for cough.  240 mL  0  . ipratropium-albuterol (DUONEB) 0.5-2.5 (3) MG/3ML SOLN Take 3 mLs by nebulization every 4 (four) hours as needed (for shortness of breath).       . lidocaine-prilocaine (EMLA) cream Apply 1 application topically as needed (for port/chemo).      Marland Kitchen metoCLOPramide (REGLAN) 10 MG  tablet Take 1 tablet (10 mg total) by mouth 4 (four) times daily -  before meals and at bedtime.  90 tablet  1  . Nutritional Supplements (FEEDING SUPPLEMENT, JEVITY 1.2 CAL,) LIQD Place 40 mL/hr into feeding tube continuous.      . ondansetron (ZOFRAN ODT) 8 MG disintegrating tablet Take 1 tablet (8 mg total) by mouth every 8 (eight) hours as needed for nausea or vomiting.  60 tablet  3  . PARoxetine (PAXIL) 20 MG tablet Take 20 mg by mouth at bedtime.      . polyethylene glycol (MIRALAX / GLYCOLAX) packet Take 17 g by mouth daily as needed for moderate constipation.      . sodium fluoride (FLUORISHIELD) 1.1 % GEL dental gel Instill one drop of fluoride per tooth space of fluoride tray. Place over teeth for 5 minutes. Remove. Spit out excess. Repeat nightly.  120 mL  prn  . sucralfate (CARAFATE) 1 G tablet Take 1 tablet (1 g total) by mouth 4 (four) times daily -  with meals and at bedtime. Dissolve in 10 mL H2O       No current facility-administered medications for this encounter.    Physical Findings: The patient is in no acute distress. Patient is alert and oriented.  height is 5\' 5"  (1.651 m) and weight is 115 lb 9.6 oz (52.436 kg). Her temperature is  97.7 F (36.5 C). Her blood pressure is 142/92 and her pulse is 68. Marland Kitchen Oropharyngeal mucosa is intact with no thrush or lesions. Moderately Thick saliva. No palpable cervical or supraclavicular lymphadenopathy. Skin dry over neck.    Lab Findings: Lab Results  Component Value Date   WBC 4.4 07/14/2014   HGB 9.8* 07/14/2014   HCT 29.9* 07/14/2014   MCV 93.1 07/14/2014   PLT 281 07/14/2014    CMP     Component Value Date/Time   NA 137 07/14/2014 1246   NA 140 05/23/2014 0430   K 4.8 07/14/2014 1246   K 4.2 05/23/2014 0430   CL 100 05/23/2014 0430   CO2 31* 07/14/2014 1246   CO2 31 05/23/2014 0430   GLUCOSE 93 07/14/2014 1246   GLUCOSE 107* 05/23/2014 0430   BUN 18.8 07/14/2014 1246   BUN 13 05/23/2014 0430   CREATININE 0.7 07/14/2014 1246    CREATININE 0.62 05/23/2014 0430   CALCIUM 9.7 07/14/2014 1246   CALCIUM 9.7 05/23/2014 0430   PROT 6.6 07/14/2014 1246   PROT 5.6* 05/22/2014 0310   ALBUMIN 3.1* 07/14/2014 1246   ALBUMIN 2.2* 05/22/2014 0310   AST 11 07/14/2014 1246   AST 12 05/22/2014 0310   ALT 7 07/14/2014 1246   ALT 9 05/22/2014 0310   ALKPHOS 76 07/14/2014 1246   ALKPHOS 77 05/22/2014 0310   BILITOT 0.20 07/14/2014 1246   BILITOT 0.2* 05/22/2014 0310   GFRNONAA >90 05/23/2014 0430   GFRAA >90 05/23/2014 0430     No results found for this basename: TSH    Radiographic Findings: No results found.  Impression/Plan:    1) Head and Neck Cancer Status: healing from ChRT  2) Nutritional Status: - weight: stable - PEG tube: using for all calories; swallowing H20  3) Risk Factors: The patient has been educated about risk factors including alcohol and tobacco abuse; they understand that avoidance of alcohol and tobacco is important to prevent recurrences as well as other cancers  4) Swallowing: only swallowing H2O - I referred her to [facility] in July but for some reason she never saw them.    5) Dental: Encouraged to continue regular followup with dentistry, and dental hygiene including fluoride rinses.   6) Thyroid function: check TSH in 3.5 mo  7) Social: No active social issues to address at this time   8) Other: Baifine TID for dry skin  9) Follow-up in 3.5  Months with PET nd TSH.  Subacute issues to be followed by med/onc until then. I will follow her with ENT in the long term. The patient was encouraged to call with any issues or questions before then.      Eppie Gibson, MD

## 2014-08-07 ENCOUNTER — Encounter: Payer: Self-pay | Admitting: Hematology and Oncology

## 2014-08-07 ENCOUNTER — Telehealth: Payer: Self-pay | Admitting: Hematology and Oncology

## 2014-08-07 ENCOUNTER — Ambulatory Visit (HOSPITAL_BASED_OUTPATIENT_CLINIC_OR_DEPARTMENT_OTHER): Payer: BC Managed Care – PPO | Admitting: Hematology and Oncology

## 2014-08-07 VITALS — BP 136/78 | HR 64 | Temp 97.7°F | Resp 18 | Ht 65.0 in | Wt 117.3 lb

## 2014-08-07 DIAGNOSIS — R110 Nausea: Secondary | ICD-10-CM

## 2014-08-07 DIAGNOSIS — E43 Unspecified severe protein-calorie malnutrition: Secondary | ICD-10-CM

## 2014-08-07 DIAGNOSIS — R1310 Dysphagia, unspecified: Secondary | ICD-10-CM

## 2014-08-07 DIAGNOSIS — C109 Malignant neoplasm of oropharynx, unspecified: Secondary | ICD-10-CM

## 2014-08-07 DIAGNOSIS — C100 Malignant neoplasm of vallecula: Secondary | ICD-10-CM

## 2014-08-07 DIAGNOSIS — R05 Cough: Secondary | ICD-10-CM

## 2014-08-07 DIAGNOSIS — M47812 Spondylosis without myelopathy or radiculopathy, cervical region: Secondary | ICD-10-CM

## 2014-08-07 DIAGNOSIS — R059 Cough, unspecified: Secondary | ICD-10-CM

## 2014-08-07 DIAGNOSIS — R070 Pain in throat: Secondary | ICD-10-CM

## 2014-08-07 DIAGNOSIS — Z931 Gastrostomy status: Secondary | ICD-10-CM

## 2014-08-07 MED ORDER — FENTANYL 50 MCG/HR TD PT72: 50.0000 ug | MEDICATED_PATCH | TRANSDERMAL | Status: DC

## 2014-08-07 NOTE — Assessment & Plan Note (Signed)
This has resolved.

## 2014-08-07 NOTE — Telephone Encounter (Signed)
Gave avs & cal for Nov.  °

## 2014-08-07 NOTE — Assessment & Plan Note (Signed)
The feeding tube looks fine with no infection

## 2014-08-07 NOTE — Progress Notes (Signed)
[place] OFFICE PROGRESS NOTE  Patient Care Team: Jonathon Bellows, MD as PCP - General (Family Medicine) Heather [place], [place] as Consulting Physician (Optometry) Brooks Sailors, RN as Oncology Nurse Navigator (Oncology) Heath Lark, MD as Consulting Physician (Hematology and Oncology) Eppie Gibson, MD as Attending Physician (Radiation Oncology) Karie Mainland, RD as Dietitian (Nutrition)  SUMMARY OF ONCOLOGIC HISTORY: Oncology History   Carcinoma of vallecula epiglottica Oropharyngeal cancer   Primary site: Pharynx - Oropharynx   Staging method: AJCC 7th Edition   Clinical: Stage IVA (T3, N2c, M0) signed by Eppie Gibson, MD on 04/20/2014  3:33 PM   Summary: Stage IVA (T3, N2c, M0)       Oropharyngeal cancer   04/14/2014 Imaging CT scan of the neck showed oropharyngeal mass.   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   04/14/2014 Surgery Laryngoscopy of the lower tongue base, vallecula, and lingual surface of the epiglottis showed a fungating irregular mass   04/26/2014 Imaging PET/CT scan showed bilateral lymphadenopathy   04/27/2014 Imaging Barium swallow shows severe dysphagia.   05/18/2014 - 05/22/2014 Hospital Admission The patient was admitted to the hospital for tracheostomy, placement of port and feeding tube.   05/24/2014 - 06/16/2014 Chemotherapy She is started on high dose cisplatin. She only received 2 doses due to intolerable side effects and patient has made an informed decision not to pursue a final dose of chemotherapy   05/24/2014 - 07/18/2014 Radiation Therapy She is started on radiation treatment    Carcinoma of vallecula epiglottica   04/20/2014 Initial Diagnosis Carcinoma of vallecula epiglottica    INTERVAL HISTORY: Please see below for problem oriented charting. She is doing better. Her voice is getting stronger and she denies further nasal drainage or throat  pain. She has not started eating solid food and is depending on her tube feeds for nutritional intake. She is currently taking fentanyl patch for back pain.  REVIEW OF SYSTEMS:   Constitutional: Denies fevers, chills or abnormal weight loss Eyes: Denies blurriness of vision Ears, nose, mouth, throat, and face: Denies mucositis or sore throat Respiratory: Denies cough, dyspnea or wheezes Cardiovascular: Denies palpitation, chest discomfort or lower extremity swelling Gastrointestinal:  Denies nausea, heartburn or change in bowel habits Skin: Denies abnormal skin rashes Lymphatics: Denies new lymphadenopathy or easy bruising Neurological:Denies numbness, tingling or new weaknesses Behavioral/Psych: Mood is stable, no new changes  All other systems were reviewed with the patient and are negative.  I have reviewed the past medical history, past surgical history, social history and family history with the patient and they are unchanged from previous note.  ALLERGIES:  is allergic to penicillins.  MEDICATIONS:  Current Outpatient Prescriptions  Medication Sig Dispense Refill  . diazepam (VALIUM) 10 MG tablet Take 5 mg by mouth at bedtime.     . fentaNYL (DURAGESIC - DOSED MCG/HR) 50 MCG/HR Place 1 patch (50 mcg total) onto the skin every 3 (three) days. 10 patch 0  . HYDROcodone-homatropine (HYCODAN) 5-1.5 MG/5ML syrup Take 5 mLs by mouth every 4 (four) hours as needed for cough. 240 mL 0  . ipratropium-albuterol (DUONEB) 0.5-2.5 (3) MG/3ML SOLN Take 3 mLs by nebulization every 4 (four) hours as needed (for shortness of breath).     . lidocaine-prilocaine (EMLA) cream Apply 1 application topically as needed (for port/chemo).    Marland Kitchen metoCLOPramide (REGLAN) 10 MG tablet Take 1 tablet (10 mg total) by mouth 4 (  four) times daily -  before meals and at bedtime. 90 tablet 1  . Nutritional Supplements (FEEDING SUPPLEMENT, JEVITY 1.2 CAL,) LIQD Place 40 mL/hr into feeding tube continuous.    .  ondansetron (ZOFRAN ODT) 8 MG disintegrating tablet Take 1 tablet (8 mg total) by mouth every 8 (eight) hours as needed for nausea or vomiting. 60 tablet 3  . PARoxetine (PAXIL) 20 MG tablet Take 20 mg by mouth at bedtime.    . polyethylene glycol (MIRALAX / GLYCOLAX) packet Take 17 g by mouth daily as needed for moderate constipation.    . sodium fluoride (FLUORISHIELD) 1.1 % GEL dental gel Instill one drop of fluoride per tooth space of fluoride tray. Place over teeth for 5 minutes. Remove. Spit out excess. Repeat nightly. 120 mL prn  . sucralfate (CARAFATE) 1 G tablet Take 1 tablet (1 g total) by mouth 4 (four) times daily -  with meals and at bedtime. Dissolve in 10 mL H2O     No current facility-administered medications for this visit.    PHYSICAL EXAMINATION: ECOG PERFORMANCE STATUS: 0 - Asymptomatic  Filed Vitals:   08/07/14 1133  BP: 136/78  Pulse: 64  Temp: 97.7 F (36.5 C)  Resp: 18   Filed Weights   08/07/14 1133  Weight: 117 lb 4.8 oz (53.207 kg)    GENERAL:alert, no distress and comfortable. She looks thin SKIN: skin color, texture, turgor are normal, no rashes or significant lesions EYES: normal, Conjunctiva are pink and non-injected, sclera clear OROPHARYNX:no exudate, no erythema and lips, buccal mucosa, and tongue normal  NECK: supple, thyroid normal size, non-tender, without nodularity. Previous tracheostomy site has healed LYMPH:  no palpable lymphadenopathy in the cervical, axillary or inguinal LUNGS: clear to auscultation and percussion with normal breathing effort HEART: regular rate & rhythm and no murmurs and no lower extremity edema ABDOMEN:abdomen soft, non-tender and normal bowel sounds. Feeding tube site looks okay Musculoskeletal:no cyanosis of digits and no clubbing  NEURO: alert & oriented x 3 with fluent speech, no focal motor/sensory deficits  LABORATORY DATA:  I have reviewed the data as listed    Component Value Date/Time   NA 137 07/14/2014  1246   NA 140 05/23/2014 0430   K 4.8 07/14/2014 1246   K 4.2 05/23/2014 0430   CL 100 05/23/2014 0430   CO2 31* 07/14/2014 1246   CO2 31 05/23/2014 0430   GLUCOSE 93 07/14/2014 1246   GLUCOSE 107* 05/23/2014 0430   BUN 18.8 07/14/2014 1246   BUN 13 05/23/2014 0430   CREATININE 0.7 07/14/2014 1246   CREATININE 0.62 05/23/2014 0430   CALCIUM 9.7 07/14/2014 1246   CALCIUM 9.7 05/23/2014 0430   PROT 6.6 07/14/2014 1246   PROT 5.6* 05/22/2014 0310   ALBUMIN 3.1* 07/14/2014 1246   ALBUMIN 2.2* 05/22/2014 0310   AST 11 07/14/2014 1246   AST 12 05/22/2014 0310   ALT 7 07/14/2014 1246   ALT 9 05/22/2014 0310   ALKPHOS 76 07/14/2014 1246   ALKPHOS 77 05/22/2014 0310   BILITOT 0.20 07/14/2014 1246   BILITOT 0.2* 05/22/2014 0310   GFRNONAA >90 05/23/2014 0430   GFRAA >90 05/23/2014 0430    No results found for: SPEP, UPEP  Lab Results  Component Value Date   WBC 4.4 07/14/2014   NEUTROABS 3.0 07/14/2014   HGB 9.8* 07/14/2014   HCT 29.9* 07/14/2014   MCV 93.1 07/14/2014   PLT 281 07/14/2014      Chemistry  Component Value Date/Time   NA 137 07/14/2014 1246   NA 140 05/23/2014 0430   K 4.8 07/14/2014 1246   K 4.2 05/23/2014 0430   CL 100 05/23/2014 0430   CO2 31* 07/14/2014 1246   CO2 31 05/23/2014 0430   BUN 18.8 07/14/2014 1246   BUN 13 05/23/2014 0430   CREATININE 0.7 07/14/2014 1246   CREATININE 0.62 05/23/2014 0430      Component Value Date/Time   CALCIUM 9.7 07/14/2014 1246   CALCIUM 9.7 05/23/2014 0430   ALKPHOS 76 07/14/2014 1246   ALKPHOS 77 05/22/2014 0310   AST 11 07/14/2014 1246   AST 12 05/22/2014 0310   ALT 7 07/14/2014 1246   ALT 9 05/22/2014 0310   BILITOT 0.20 07/14/2014 1246   BILITOT 0.2* 05/22/2014 0310      ASSESSMENT & PLAN:  Oropharyngeal cancer She tolerates treatment well. Continue supportive care. Repeat PET/CT scan in 4 months. Clinically, she has no residual disease    Cough Her history suggest this could be related  to nasal drainage from the oropharynx. This is improving.    Dysphagia She has significant dysphagia. She was recently seen by speech and language therapist for assessment. I encouraged her to attempt soft diet if possible.    Nausea without vomiting Her nausea is well controlled with Reglan before meals. I refilled her prescription.  Protein-calorie malnutrition, severe She will continue on nutritional supplement through her feeding tube.I encouraged her to attempt oral intake.    S/P percutaneous endoscopic gastrostomy (PEG) tube placement The feeding tube looks fine with no infection    Throat pain This has resolved.  DJD (degenerative joint disease) of cervical spine She is requesting prescription refill for back pain and not for throat pain. I recommend she reestablish her follow-up with her physician that manages back pain and to get medication refill from them. I have refilled another 30 day supply of fentanyl patch today.   No orders of the defined types were placed in this encounter.   All questions were answered. The patient knows to call the clinic with any problems, questions or concerns. No barriers to learning was detected. I spent 25 minutes counseling the patient face to face. The total time spent in the appointment was 30 minutes and more than 50% was on counseling and review of test results     [facility], [place], MD 08/07/2014 12:14 PM

## 2014-08-07 NOTE — Assessment & Plan Note (Signed)
She is requesting prescription refill for back pain and not for throat pain. I recommend she reestablish her follow-up with her physician that manages back pain and to get medication refill from them. I have refilled another 30 day supply of fentanyl patch today.

## 2014-08-07 NOTE — Assessment & Plan Note (Signed)
She will continue on nutritional supplement through her feeding tube.I encouraged her to attempt oral intake.

## 2014-08-07 NOTE — Assessment & Plan Note (Signed)
Her history suggest this could be related to nasal drainage from the oropharynx. This is improving.

## 2014-08-07 NOTE — Assessment & Plan Note (Signed)
Her nausea is well controlled with Reglan before meals. I refilled her prescription.

## 2014-08-07 NOTE — Assessment & Plan Note (Signed)
She has significant dysphagia. She was recently seen by speech and language therapist for assessment. I encouraged her to attempt soft diet if possible.

## 2014-08-07 NOTE — Assessment & Plan Note (Signed)
She tolerates treatment well. Continue supportive care. Repeat PET/CT scan in 4 months. Clinically, she has no residual disease

## 2014-08-14 ENCOUNTER — Ambulatory Visit (HOSPITAL_COMMUNITY): Payer: Self-pay | Admitting: Dentistry

## 2014-08-16 ENCOUNTER — Other Ambulatory Visit: Payer: Self-pay | Admitting: Hematology and Oncology

## 2014-09-04 ENCOUNTER — Encounter: Payer: Self-pay | Admitting: Hematology and Oncology

## 2014-09-04 ENCOUNTER — Ambulatory Visit (HOSPITAL_BASED_OUTPATIENT_CLINIC_OR_DEPARTMENT_OTHER): Payer: BC Managed Care – PPO | Admitting: Hematology and Oncology

## 2014-09-04 VITALS — BP 125/86 | HR 82 | Temp 98.0°F | Resp 18 | Ht 65.0 in | Wt 114.6 lb

## 2014-09-04 DIAGNOSIS — R110 Nausea: Secondary | ICD-10-CM

## 2014-09-04 DIAGNOSIS — C109 Malignant neoplasm of oropharynx, unspecified: Secondary | ICD-10-CM

## 2014-09-04 DIAGNOSIS — C100 Malignant neoplasm of vallecula: Secondary | ICD-10-CM

## 2014-09-04 DIAGNOSIS — Z931 Gastrostomy status: Secondary | ICD-10-CM

## 2014-09-04 DIAGNOSIS — E43 Unspecified severe protein-calorie malnutrition: Secondary | ICD-10-CM

## 2014-09-04 MED ORDER — SCOPOLAMINE 1 MG/3DAYS TD PT72SCOPOLAMINE 1 MG/3DAYS
1.0000 | MEDICATED_PATCH | TRANSDERMAL | Status: DC
Start: 2014-09-04 — End: 2014-10-18

## 2014-09-04 NOTE — Assessment & Plan Note (Signed)
She will continue on nutritional supplement through her feeding tube.I encouraged her to attempt oral intake.

## 2014-09-04 NOTE — Assessment & Plan Note (Signed)
The feeding tube looks fine with no infection

## 2014-09-04 NOTE — Assessment & Plan Note (Addendum)
Clinically, she has no residual disease. However, she have recurrence of nausea and vomiting over the past week which I suspect is due to recent withdrawal from Paxil and possibly GI irritation from feeding tube. I recommended a trial of scopolamine patch. I discussed with her the utility of prescribing intravenous fluids and IV antiemetics but she preferred to try the scopolamine patch first. I plan to see her back in 2 weeks to reassess. Likely, I believe the patient has a setback from recent improvement several weeks ago. At this rate, I do not believe she can return back to work on 10/06/2014. I will write a letter to support her to remain disabled until after her PET CT scan is done next year.

## 2014-09-04 NOTE — Progress Notes (Signed)
[place] OFFICE PROGRESS NOTE  Patient Care Team: Jonathon Bellows, MD as PCP - General (Family Medicine) Heather [place], [place] as Consulting Physician (Optometry) Brooks Sailors, RN as Oncology Nurse Navigator (Oncology) Heath Lark, MD as Consulting Physician (Hematology and Oncology) Eppie Gibson, MD as Attending Physician (Radiation Oncology) Karie Mainland, RD as Dietitian (Nutrition)  SUMMARY OF ONCOLOGIC HISTORY: Oncology History   Carcinoma of vallecula epiglottica Oropharyngeal cancer   Primary site: Pharynx - Oropharynx   Staging method: AJCC 7th Edition   Clinical: Stage IVA (T3, N2c, M0) signed by Eppie Gibson, MD on 04/20/2014  3:33 PM   Summary: Stage IVA (T3, N2c, M0)       Oropharyngeal cancer   04/14/2014 Imaging CT scan of the neck showed oropharyngeal mass.   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   04/14/2014 Surgery Laryngoscopy of the lower tongue base, vallecula, and lingual surface of the epiglottis showed a fungating irregular mass   04/26/2014 Imaging PET/CT scan showed bilateral lymphadenopathy   04/27/2014 Imaging Barium swallow shows severe dysphagia.   05/18/2014 - 05/22/2014 Hospital Admission The patient was admitted to the hospital for tracheostomy, placement of port and feeding tube.   05/24/2014 - 06/16/2014 Chemotherapy She is started on high dose cisplatin. She only received 2 doses due to intolerable side effects and patient has made an informed decision not to pursue a final dose of chemotherapy   05/24/2014 - 07/18/2014 Radiation Therapy She is started on radiation treatment    Carcinoma of vallecula epiglottica   04/20/2014 Initial Diagnosis Carcinoma of vallecula epiglottica    INTERVAL HISTORY: Please see below for problem oriented charting. She is seen today as part of her supportive care visit. The patient is crying. She was not able to  refill some of the medications due to insurance issue and have not taken Paxil for several weeks. She felt that she is getting nervous, tearful and had regular nausea and vomiting. She is afraid to swallow pills because it causes vomiting soon after that. She was dehydrated and received intravenous antiemetics to her PCP office with some improvement. She had progressive weight loss recently.  REVIEW OF SYSTEMS:   Constitutional: Denies fevers, chills  Eyes: Denies blurriness of vision Ears, nose, mouth, throat, and face: Denies mucositis or sore throat Respiratory: Denies cough, dyspnea or wheezes Cardiovascular: Denies palpitation, chest discomfort or lower extremity swelling Skin: Denies abnormal skin rashes Lymphatics: Denies new lymphadenopathy or easy bruising Neurological:Denies numbness, tingling or new weaknesses Behavioral/Psych: Mood is stable, no new changes  All other systems were reviewed with the patient and are negative.  I have reviewed the past medical history, past surgical history, social history and family history with the patient and they are unchanged from previous note.  ALLERGIES:  is allergic to penicillins.  MEDICATIONS:  Current Outpatient Prescriptions  Medication Sig Dispense Refill  . diazepam (VALIUM) 10 MG tablet Take 5 mg by mouth at bedtime.     . fentaNYL (DURAGESIC - DOSED MCG/HR) 50 MCG/HR Place 1 patch (50 mcg total) onto the skin every 3 (three) days. 10 patch 0  . lidocaine-prilocaine (EMLA) cream Apply 1 application topically as needed (for port/chemo).    . Nutritional Supplements (FEEDING SUPPLEMENT, JEVITY 1.2 CAL,) LIQD Place 40 mL/hr into feeding tube continuous.    . ondansetron (ZOFRAN ODT) 8 MG disintegrating tablet Take 1 tablet (8 mg total) by mouth every 8 (  eight) hours as needed for nausea or vomiting. 60 tablet 3  . sodium fluoride (FLUORISHIELD) 1.1 % GEL dental gel Instill one drop of fluoride per tooth space of fluoride tray.  Place over teeth for 5 minutes. Remove. Spit out excess. Repeat nightly. 120 mL prn  . PARoxetine (PAXIL) 20 MG tablet Take 20 mg by mouth at bedtime.    Marland Kitchen scopolamine (TRANSDERM-SCOP) 1 MG/3DAYS Place 1 patch (1.5 mg total) onto the skin every 3 (three) days. 10 patch 12   No current facility-administered medications for this visit.    PHYSICAL EXAMINATION: ECOG PERFORMANCE STATUS: 1 - Symptomatic but completely ambulatory  Filed Vitals:   09/04/14 1119  BP: 125/86  Pulse: 82  Temp: 98 F (36.7 C)  Resp: 18   Filed Weights   09/04/14 1119  Weight: 114 lb 9.6 oz (51.982 kg)    GENERAL:alert, no distress and comfortable. She looks thin, cachectic and tearful SKIN: skin color, texture, turgor are normal, no rashes or significant lesions EYES: normal, Conjunctiva are pink and non-injected, sclera clear OROPHARYNX:no exudate, no erythema and lips, buccal mucosa, and tongue normal  NECK: supple, thyroid normal size, non-tender, without nodularity LYMPH:  no palpable lymphadenopathy in the cervical, axillary or inguinal LUNGS: clear to auscultation and percussion with normal breathing effort HEART: regular rate & rhythm and no murmurs and no lower extremity edema ABDOMEN:abdomen soft, non-tender and normal bowel sounds. Feeding tube site looks okay Musculoskeletal:no cyanosis of digits and no clubbing  NEURO: alert & oriented x 3 with fluent speech, no focal motor/sensory deficits  LABORATORY DATA:  I have reviewed the data as listed    Component Value Date/Time   NA 137 07/14/2014 1246   NA 140 05/23/2014 0430   K 4.8 07/14/2014 1246   K 4.2 05/23/2014 0430   CL 100 05/23/2014 0430   CO2 31* 07/14/2014 1246   CO2 31 05/23/2014 0430   GLUCOSE 93 07/14/2014 1246   GLUCOSE 107* 05/23/2014 0430   BUN 18.8 07/14/2014 1246   BUN 13 05/23/2014 0430   CREATININE 0.7 07/14/2014 1246   CREATININE 0.62 05/23/2014 0430   CALCIUM 9.7 07/14/2014 1246   CALCIUM 9.7 05/23/2014 0430    PROT 6.6 07/14/2014 1246   PROT 5.6* 05/22/2014 0310   ALBUMIN 3.1* 07/14/2014 1246   ALBUMIN 2.2* 05/22/2014 0310   AST 11 07/14/2014 1246   AST 12 05/22/2014 0310   ALT 7 07/14/2014 1246   ALT 9 05/22/2014 0310   ALKPHOS 76 07/14/2014 1246   ALKPHOS 77 05/22/2014 0310   BILITOT 0.20 07/14/2014 1246   BILITOT 0.2* 05/22/2014 0310   GFRNONAA >90 05/23/2014 0430   GFRAA >90 05/23/2014 0430    No results found for: SPEP, UPEP  Lab Results  Component Value Date   WBC 4.4 07/14/2014   NEUTROABS 3.0 07/14/2014   HGB 9.8* 07/14/2014   HCT 29.9* 07/14/2014   MCV 93.1 07/14/2014   PLT 281 07/14/2014      Chemistry      Component Value Date/Time   NA 137 07/14/2014 1246   NA 140 05/23/2014 0430   K 4.8 07/14/2014 1246   K 4.2 05/23/2014 0430   CL 100 05/23/2014 0430   CO2 31* 07/14/2014 1246   CO2 31 05/23/2014 0430   BUN 18.8 07/14/2014 1246   BUN 13 05/23/2014 0430   CREATININE 0.7 07/14/2014 1246   CREATININE 0.62 05/23/2014 0430      Component Value Date/Time   CALCIUM 9.7 07/14/2014  1246   CALCIUM 9.7 05/23/2014 0430   ALKPHOS 76 07/14/2014 1246   ALKPHOS 77 05/22/2014 0310   AST 11 07/14/2014 1246   AST 12 05/22/2014 0310   ALT 7 07/14/2014 1246   ALT 9 05/22/2014 0310   BILITOT 0.20 07/14/2014 1246   BILITOT 0.2* 05/22/2014 0310      ASSESSMENT & PLAN:  Oropharyngeal cancer Clinically, she has no residual disease. However, she have recurrence of nausea and vomiting over the past week which I suspect is due to recent withdrawal from Paxil and possibly GI irritation from feeding tube. I recommended a trial of scopolamine patch. I discussed with her the utility of prescribing intravenous fluids and IV antiemetics but she preferred to try the scopolamine patch first. I plan to see her back in 2 weeks to reassess. Likely, I believe the patient has a setback from recent improvement several weeks ago. At this rate, I do not believe she can return back to work  on 10/06/2014. I will write a letter to support her to remain disabled until after her PET CT scan is done next year.  Protein-calorie malnutrition, severe She will continue on nutritional supplement through her feeding tube.I encouraged her to attempt oral intake.      S/P percutaneous endoscopic gastrostomy (PEG) tube placement The feeding tube looks fine with no infection   Nausea without vomiting I recommend a trial of scopolamine patch. I suspect there is an element of withdrawal from recent discontinuation of Paxil.   No orders of the defined types were placed in this encounter.   All questions were answered. The patient knows to call the clinic with any problems, questions or concerns. No barriers to learning was detected. I spent 30 minutes counseling the patient face to face. The total time spent in the appointment was 40 minutes and more than 50% was on counseling and review of test results     [facility], [place], MD 09/04/2014 8:22 PM

## 2014-09-04 NOTE — Assessment & Plan Note (Signed)
I recommend a trial of scopolamine patch. I suspect there is an element of withdrawal from recent discontinuation of Paxil.

## 2014-09-05 ENCOUNTER — Telehealth: Payer: Self-pay | Admitting: Hematology and Oncology

## 2014-09-05 NOTE — Telephone Encounter (Signed)
s.w. pt and advised on DEC appt.....pt ok adn aware °

## 2014-09-06 ENCOUNTER — Encounter: Payer: Self-pay | Admitting: *Deleted

## 2014-09-06 ENCOUNTER — Encounter (HOSPITAL_COMMUNITY): Payer: Self-pay | Admitting: Dentistry

## 2014-09-06 ENCOUNTER — Ambulatory Visit (HOSPITAL_COMMUNITY): Payer: Medicaid - Dental | Admitting: Dentistry

## 2014-09-06 VITALS — BP 141/85 | HR 73 | Temp 97.9°F | Wt 114.0 lb

## 2014-09-06 DIAGNOSIS — C100 Malignant neoplasm of vallecula: Secondary | ICD-10-CM

## 2014-09-06 DIAGNOSIS — R682 Dry mouth, unspecified: Secondary | ICD-10-CM

## 2014-09-06 DIAGNOSIS — K117 Disturbances of salivary secretion: Secondary | ICD-10-CM

## 2014-09-06 DIAGNOSIS — K0530 Chronic periodontitis, unspecified: Secondary | ICD-10-CM

## 2014-09-06 DIAGNOSIS — Z9221 Personal history of antineoplastic chemotherapy: Secondary | ICD-10-CM

## 2014-09-06 DIAGNOSIS — IMO0002 Reserved for concepts with insufficient information to code with codable children: Secondary | ICD-10-CM

## 2014-09-06 DIAGNOSIS — R432 Parageusia: Secondary | ICD-10-CM

## 2014-09-06 DIAGNOSIS — Z923 Personal history of irradiation: Secondary | ICD-10-CM

## 2014-09-06 DIAGNOSIS — R110 Nausea: Secondary | ICD-10-CM

## 2014-09-06 NOTE — Progress Notes (Signed)
09/06/2014  Patient Name:   Stacey Carroll Date of Birth:   1960/11/13 Medical Record Number: [id number]  BP 141/85 mmHg  Pulse 73  Temp(Src) 97.9 F (36.6 C) (Oral)  Wt 114 lb (51.71 kg)  ARLONE LENHARDT presents for oral examination after chemoradiation therapy. Patient has completed radiation treatments from 05/24/14 thru 07/18/14. Patient received 2 cycles of chemotherapy by patient report.  REVIEW OF CHIEF COMPLAINTS:  DRY MOUTH: Yes HARD TO SWALLOW: No  HURT TO SWALLOW: No TASTE CHANGES: Taste is slowly returning. SORES IN MOUTH: No sores in her mouth. TRISMUS: No problems with trismus WEIGHT: 114 pounds. Significant weight loss issue secondary to persistent nausea.  HOME OH REGIMEN:  BRUSHING: The patient attempts to brush at least once a day. Patient was encouraged to brush at least twice a day. FLOSSING: None RINSING: Patient to consider use of salt water and baking soda rinses. FLUORIDE: She currently not using her fluoride in her fluoride trays. Patient was encouraged to use fluoride to prevent radiation caries. TRISMUS EXERCISES:  Maximum interincisal opening: 40 mm.   DENTAL EXAM:  Oral Hygiene:(PLAQUE): Plaque is noted. Oral hygiene improvement was for highly recommended. LOCATION OF MUCOSITIS: None noted DESCRIPTION OF SALIVA: Decreased saliva and moderate xerostomia ANY EXPOSED BONE: None noted OTHER WATCHED AREAS: Previous extraction sites. Multiple flexure lesions. DX: Xerostomia, Dysgeusia, Weight Loss, Accretions and Multiple flexure lesions, and chronic periodontitis with gingival recession  RECOMMENDATIONS: 1. Brush after meals and at bedtime.  Use fluoride at bedtime. Minimally patient should brush twice daily. 2. Use trismus exercises as directed. 3. Use Biotene Rinse or salt water/baking soda rinses. 4. Multiple sips of water as needed. 5. Return to Dr. Chelsea Aus in two months for oral exam and periodontal therapy. 6. Patient is to provide the  name of her new primary dentist as soon as possible to allow for referral of records to that dentist after release of information is signed. Dr. Essie Hart may also assist in this process.   Lenn Cal, DDS

## 2014-09-06 NOTE — Progress Notes (Signed)
Letter to extend pt's Disability written and signed by Dr. Alvy Bimler.  Given to [facility] in managed Care Dept.Marland Kitchen

## 2014-09-06 NOTE — Patient Instructions (Addendum)
RECOMMENDATIONS: 1. Brush after meals and at bedtime.  Use fluoride at bedtime. Minimally patient should brush twice daily. 2. Use trismus exercises as directed. 3. Use Biotene Rinse or salt water/baking soda rinses. 4. Multiple sips of water as needed. 5. Return to Dr. Chelsea Aus in two months for oral exam and periodontal therapy. 6. Patient is to provide the name of her new primary dentist as soon as possible to allow for referral of records to that dentist after release of information is signed. Dr. Essie Hart may also assist in this process.   Lenn Cal, DDS

## 2014-09-07 ENCOUNTER — Encounter (HOSPITAL_COMMUNITY): Payer: Self-pay | Admitting: Dentistry

## 2014-09-14 ENCOUNTER — Encounter: Payer: Self-pay | Admitting: Hematology and Oncology

## 2014-09-14 NOTE — Progress Notes (Signed)
Faxed clinical information and note to [facility] attention [place] @ [phone]

## 2014-09-18 ENCOUNTER — Ambulatory Visit (HOSPITAL_BASED_OUTPATIENT_CLINIC_OR_DEPARTMENT_OTHER): Payer: BC Managed Care – PPO | Admitting: Hematology and Oncology

## 2014-09-18 ENCOUNTER — Telehealth: Payer: Self-pay | Admitting: Hematology and Oncology

## 2014-09-18 ENCOUNTER — Other Ambulatory Visit (HOSPITAL_BASED_OUTPATIENT_CLINIC_OR_DEPARTMENT_OTHER): Payer: BC Managed Care – PPO

## 2014-09-18 ENCOUNTER — Encounter: Payer: Self-pay | Admitting: Hematology and Oncology

## 2014-09-18 VITALS — BP 126/74 | HR 69 | Temp 97.9°F | Resp 18 | Ht 65.0 in | Wt 115.7 lb

## 2014-09-18 DIAGNOSIS — R1310 Dysphagia, unspecified: Secondary | ICD-10-CM

## 2014-09-18 DIAGNOSIS — R112 Nausea with vomiting, unspecified: Secondary | ICD-10-CM

## 2014-09-18 DIAGNOSIS — C109 Malignant neoplasm of oropharynx, unspecified: Secondary | ICD-10-CM

## 2014-09-18 DIAGNOSIS — R110 Nausea: Secondary | ICD-10-CM

## 2014-09-18 DIAGNOSIS — R070 Pain in throat: Secondary | ICD-10-CM

## 2014-09-18 DIAGNOSIS — M1990 Unspecified osteoarthritis, unspecified site: Secondary | ICD-10-CM

## 2014-09-18 DIAGNOSIS — Z931 Gastrostomy status: Secondary | ICD-10-CM

## 2014-09-18 DIAGNOSIS — M47812 Spondylosis without myelopathy or radiculopathy, cervical region: Secondary | ICD-10-CM

## 2014-09-18 DIAGNOSIS — C100 Malignant neoplasm of vallecula: Secondary | ICD-10-CM

## 2014-09-18 LAB — CBC WITH DIFFERENTIAL/PLATELET
BASO%: 0.9 % (ref 0.0–2.0)
Basophils Absolute: 0 10*3/uL (ref 0.0–0.1)
EOS%: 1.8 % (ref 0.0–7.0)
Eosinophils Absolute: 0.1 10*3/uL (ref 0.0–0.5)
HCT: 38.2 % (ref 34.8–46.6)
HGB: 12.6 g/dL (ref 11.6–15.9)
LYMPH%: 13 % — AB (ref 14.0–49.7)
MCH: 32 pg (ref 25.1–34.0)
MCHC: 33.1 g/dL (ref 31.5–36.0)
MCV: 96.9 fL (ref 79.5–101.0)
MONO#: 0.7 10*3/uL (ref 0.1–0.9)
MONO%: 12.2 % (ref 0.0–14.0)
NEUT#: 4.2 10*3/uL (ref 1.5–6.5)
NEUT%: 72.1 % (ref 38.4–76.8)
PLATELETS: 264 10*3/uL (ref 145–400)
RBC: 3.95 10*6/uL (ref 3.70–5.45)
RDW: 13.3 % (ref 11.2–14.5)
WBC: 5.8 10*3/uL (ref 3.9–10.3)
lymph#: 0.8 10*3/uL — ABNORMAL LOW (ref 0.9–3.3)

## 2014-09-18 LAB — COMPREHENSIVE METABOLIC PANEL (CC13)
ALK PHOS: 84 U/L (ref 40–150)
ALT: 9 U/L (ref 0–55)
AST: 13 U/L (ref 5–34)
Albumin: 3.7 g/dL (ref 3.5–5.0)
Anion Gap: 11 mEq/L (ref 3–11)
BILIRUBIN TOTAL: 0.34 mg/dL (ref 0.20–1.20)
BUN: 22.2 mg/dL (ref 7.0–26.0)
CO2: 28 mEq/L (ref 22–29)
CREATININE: 0.8 mg/dL (ref 0.6–1.1)
Calcium: 9.9 mg/dL (ref 8.4–10.4)
Chloride: 99 mEq/L (ref 98–109)
EGFR: 81 mL/min/{1.73_m2} — ABNORMAL LOW (ref >90)
Glucose: 95 mg/dl (ref 70–140)
Potassium: 4.3 mEq/L (ref 3.5–5.1)
Sodium: 138 mEq/L (ref 136–145)
Total Protein: 7.2 g/dL (ref 6.4–8.3)

## 2014-09-18 LAB — MAGNESIUM (CC13): Magnesium: 2.4 mg/dl (ref 1.5–2.5)

## 2014-09-18 MED ORDER — RANITIDINE HCL 150 MG/10ML PO SYRP: 150.0000 mg | ORAL_SOLUTION | Freq: Two times a day (BID) | ORAL | Status: DC

## 2014-09-18 MED ORDER — FENTANYL 50 MCG/HR TD PT72: 50.0000 ug | MEDICATED_PATCH | TRANSDERMAL | Status: DC

## 2014-09-18 NOTE — Assessment & Plan Note (Signed)
I recommend a trial of scopolamine patch which she found helpful I suspect there is an element of withdrawal from recent discontinuation of Paxil. I also suspect she may have gastritis and give her prescription Zantac to take through her feeding tube.

## 2014-09-18 NOTE — Assessment & Plan Note (Signed)
The feeding tube looks fine with no infection

## 2014-09-18 NOTE — Assessment & Plan Note (Signed)
She is requesting prescription refill for back pain and not for throat pain. I recommend she reestablish her follow-up with her physician that manages back pain and to get medication refill from them. I have refilled another 30 day supply of fentanyl patch today.

## 2014-09-18 NOTE — Progress Notes (Signed)
[facility] OFFICE PROGRESS NOTE  Patient Care Team: Jonathon Bellows, MD as PCP - General (Family Medicine) [place], [place] as Consulting Physician (Optometry) Brooks Sailors, RN as Oncology Nurse Navigator (Oncology) [facility] Lark, MD as Consulting Physician (Hematology and Oncology) Wyvonnia Lora, MD as Attending Physician (Radiation Oncology) Karie Mainland, RD as Dietitian (Nutrition)  SUMMARY OF ONCOLOGIC HISTORY: Oncology History   Carcinoma of vallecula epiglottica Oropharyngeal cancer   Primary site: Pharynx - Oropharynx   Staging method: AJCC 7th Edition   Clinical: Stage IVA (T3, N2c, M0) signed by Eppie Gibson, MD on 04/20/2014  3:33 PM   Summary: Stage IVA (T3, N2c, M0)       Oropharyngeal cancer   04/14/2014 Imaging CT scan of the neck showed oropharyngeal mass.   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   04/14/2014 Surgery Laryngoscopy of the lower tongue base, vallecula, and lingual surface of the epiglottis showed a fungating irregular mass   04/26/2014 Imaging PET/CT scan showed bilateral lymphadenopathy   04/27/2014 Imaging Barium swallow shows severe dysphagia.   05/18/2014 - 05/22/2014 Hospital Admission The patient was admitted to the hospital for tracheostomy, placement of port and feeding tube.   05/24/2014 - 06/16/2014 Chemotherapy She is started on high dose cisplatin. She only received 2 doses due to intolerable side effects and patient has made an informed decision not to pursue a final dose of chemotherapy   05/24/2014 - 07/18/2014 Radiation Therapy She is started on radiation treatment    Carcinoma of vallecula epiglottica   04/20/2014 Initial Diagnosis Carcinoma of vallecula epiglottica    INTERVAL HISTORY: Please see below for problem oriented charting. She returns for supportive care. She still has some mild dysphagia and continues to be dependent on  feeding tube. Since I saw her, her nausea is better controlled with scopolamine patch. She complained of severe heartburn/gastritis. Her pain is controlled with current prescription fentanyl patch. She felt that she cannot return back to with due to persistent dysphagia, nausea and pain.  REVIEW OF SYSTEMS:   Constitutional: Denies fevers, chills or abnormal weight loss Eyes: Denies blurriness of vision Ears, nose, mouth, throat, and face: Denies mucositis or sore throat Respiratory: Denies cough, dyspnea or wheezes Cardiovascular: Denies palpitation, chest discomfort or lower extremity swelling Skin: Denies abnormal skin rashes Lymphatics: Denies new lymphadenopathy or easy bruising Neurological:Denies numbness, tingling or new weaknesses Behavioral/Psych: Mood is stable, no new changes  All other systems were reviewed with the patient and are negative.  I have reviewed the past medical history, past surgical history, social history and family history with the patient and they are unchanged from previous note.  ALLERGIES:  is allergic to penicillins.  MEDICATIONS:  Current Outpatient Prescriptions  Medication Sig Dispense Refill  . diazepam (VALIUM) 10 MG tablet Take 5 mg by mouth at bedtime.     . fentaNYL (DURAGESIC - DOSED MCG/HR) 50 MCG/HR Place 1 patch (50 mcg total) onto the skin every 3 (three) days. 10 patch 0  . lidocaine-prilocaine (EMLA) cream Apply 1 application topically as needed (for port/chemo).    . Nutritional Supplements (FEEDING SUPPLEMENT, JEVITY 1.2 CAL,) LIQD Place 40 mL/hr into feeding tube continuous.    . ondansetron (ZOFRAN ODT) 8 MG disintegrating tablet Take 1 tablet (8 mg total) by mouth every 8 (eight) hours as needed for nausea or vomiting. 60 tablet 3  . PARoxetine (PAXIL) 20 MG tablet Take  20 mg by mouth at bedtime.    Marland Kitchen scopolamine (TRANSDERM-SCOP) 1 MG/3DAYS Place 1 patch (1.5 mg total) onto the skin every 3 (three) days. 10 patch 12  . sodium  fluoride (FLUORISHIELD) 1.1 % GEL dental gel Instill one drop of fluoride per tooth space of fluoride tray. Place over teeth for 5 minutes. Remove. Spit out excess. Repeat nightly. 120 mL prn  . ranitidine (ZANTAC) 150 MG/10ML syrup Place 10 mLs (150 mg total) into feeding tube 2 (two) times daily. 300 mL 5   No current facility-administered medications for this visit.    PHYSICAL EXAMINATION: ECOG PERFORMANCE STATUS: 1 - Symptomatic but completely ambulatory  Filed Vitals:   09/18/14 1332  BP: 126/74  Pulse: 69  Temp: 97.9 F (36.6 C)  Resp: 18   Filed Weights   09/18/14 1332  Weight: 115 lb 11.2 oz (52.481 kg)    GENERAL:alert, no distress and comfortable SKIN: skin color, texture, turgor are normal, no rashes or significant lesions EYES: normal, Conjunctiva are pink and non-injected, sclera clear OROPHARYNX:no exudate, no erythema and lips, buccal mucosa, and tongue normal  NECK: supple, thyroid normal size, non-tender, without nodularity LYMPH:  no palpable lymphadenopathy in the cervical, axillary or inguinal LUNGS: clear to auscultation and percussion with normal breathing effort HEART: regular rate & rhythm and no murmurs and no lower extremity edema ABDOMEN:abdomen soft, non-tender and normal bowel sounds. Feeding tube site looks okay Musculoskeletal:no cyanosis of digits and no clubbing  NEURO: alert & oriented x 3 with fluent speech, no focal motor/sensory deficits  LABORATORY DATA:  I have reviewed the data as listed    Component Value Date/Time   NA 138 09/18/2014 1258   NA 140 05/23/2014 0430   K 4.3 09/18/2014 1258   K 4.2 05/23/2014 0430   CL 100 05/23/2014 0430   CO2 28 09/18/2014 1258   CO2 31 05/23/2014 0430   GLUCOSE 95 09/18/2014 1258   GLUCOSE 107* 05/23/2014 0430   BUN 22.2 09/18/2014 1258   BUN 13 05/23/2014 0430   CREATININE 0.8 09/18/2014 1258   CREATININE 0.62 05/23/2014 0430   CALCIUM 9.9 09/18/2014 1258   CALCIUM 9.7 05/23/2014 0430    PROT 7.2 09/18/2014 1258   PROT 5.6* 05/22/2014 0310   ALBUMIN 3.7 09/18/2014 1258   ALBUMIN 2.2* 05/22/2014 0310   AST 13 09/18/2014 1258   AST 12 05/22/2014 0310   ALT 9 09/18/2014 1258   ALT 9 05/22/2014 0310   ALKPHOS 84 09/18/2014 1258   ALKPHOS 77 05/22/2014 0310   BILITOT 0.34 09/18/2014 1258   BILITOT 0.2* 05/22/2014 0310   GFRNONAA >90 05/23/2014 0430   GFRAA >90 05/23/2014 0430    No results found for: SPEP, UPEP  Lab Results  Component Value Date   WBC 5.8 09/18/2014   NEUTROABS 4.2 09/18/2014   HGB 12.6 09/18/2014   HCT 38.2 09/18/2014   MCV 96.9 09/18/2014   PLT 264 09/18/2014      Chemistry      Component Value Date/Time   NA 138 09/18/2014 1258   NA 140 05/23/2014 0430   K 4.3 09/18/2014 1258   K 4.2 05/23/2014 0430   CL 100 05/23/2014 0430   CO2 28 09/18/2014 1258   CO2 31 05/23/2014 0430   BUN 22.2 09/18/2014 1258   BUN 13 05/23/2014 0430   CREATININE 0.8 09/18/2014 1258   CREATININE 0.62 05/23/2014 0430      Component Value Date/Time   CALCIUM 9.9 09/18/2014 1258  CALCIUM 9.7 05/23/2014 0430   ALKPHOS 84 09/18/2014 1258   ALKPHOS 77 05/22/2014 0310   AST 13 09/18/2014 1258   AST 12 05/22/2014 0310   ALT 9 09/18/2014 1258   ALT 9 05/22/2014 0310   BILITOT 0.34 09/18/2014 1258   BILITOT 0.2* 05/22/2014 0310      ASSESSMENT & PLAN:  Oropharyngeal cancer Clinically, she has no residual disease. I will write a letter to support her to remain disabled until after her PET CT scan is done next year. I will see her back in a month for supportive care. I recommend she contact ENT for further evaluation    DJD (degenerative joint disease) of cervical spine She is requesting prescription refill for back pain and not for throat pain. I recommend she reestablish her follow-up with her physician that manages back pain and to get medication refill from them. I have refilled another 30 day supply of fentanyl patch today.  Dysphagia She has  significant dysphagia. She was recently seen by speech and language therapist for assessment. I encouraged her to attempt soft diet if possible. She denies recent aspiration episode.   Nausea without vomiting I recommend a trial of scopolamine patch which she found helpful I suspect there is an element of withdrawal from recent discontinuation of Paxil. I also suspect she may have gastritis and give her prescription Zantac to take through her feeding tube.    S/P percutaneous endoscopic gastrostomy (PEG) tube placement The feeding tube looks fine with no infection    No orders of the defined types were placed in this encounter.   All questions were answered. The patient knows to call the clinic with any problems, questions or concerns. No barriers to learning was detected. I spent 25 minutes counseling the patient face to face. The total time spent in the appointment was 30 minutes and more than 50% was on counseling and review of test results     [facility], [place], MD 09/18/2014 3:16 PM

## 2014-09-18 NOTE — Assessment & Plan Note (Signed)
She has significant dysphagia. She was recently seen by speech and language therapist for assessment. I encouraged her to attempt soft diet if possible. She denies recent aspiration episode.

## 2014-09-18 NOTE — Telephone Encounter (Signed)
Pt confirmed labs/ov per 12/14 POF, gave pt AVS..... KJ °

## 2014-09-18 NOTE — Assessment & Plan Note (Signed)
Clinically, she has no residual disease. I will write a letter to support her to remain disabled until after her PET CT scan is done next year. I will see her back in a month for supportive care. I recommend she contact ENT for further evaluation

## 2014-10-16 ENCOUNTER — Telehealth: Payer: Self-pay | Admitting: *Deleted

## 2014-10-16 ENCOUNTER — Other Ambulatory Visit: Payer: Self-pay | Admitting: *Deleted

## 2014-10-16 NOTE — Telephone Encounter (Signed)
Received call from patient's son: He reported that his mother: 31. Has been experiencing N&V for about a week, 2. Pain associated with vomiting, 3. Continues to use feeding pump for continuous feed at HS, 4. Unable to eat anything by mouth, 5. Currently weighs ca. 105#, 6. Is occasionally dizzy, is not currently using Scopolamine; "I haven't been able to get by the pharmacy to pick it up, I'm having serious panic attacks lately and I'm not driving". He is aware of her appointment with Dr. Alvy Bimler this Thursday.  I indicated I would inform her of his call and follow-up.  Gayleen Orem, RN, BSN, [place] at [place] [phone]

## 2014-10-16 NOTE — Telephone Encounter (Signed)
Stacey Carroll, can she come in on Wednesday with labs at 1030 am and see me at 11 am? If so, let me know and I will place orders. Be prepared to get IVF for the next few days

## 2014-10-16 NOTE — Telephone Encounter (Signed)
Called patient with Dr. Calton Dach proposal for Wednesday 10:30 labs followed by 1100 appt.  She understands she will likely received IVF for several days.  Gayleen Orem, RN, BSN, [place] at [place] [phone]

## 2014-10-17 ENCOUNTER — Other Ambulatory Visit: Payer: Self-pay | Admitting: Hematology and Oncology

## 2014-10-17 ENCOUNTER — Telehealth: Payer: Self-pay | Admitting: Hematology and Oncology

## 2014-10-17 DIAGNOSIS — C109 Malignant neoplasm of oropharynx, unspecified: Secondary | ICD-10-CM

## 2014-10-18 ENCOUNTER — Ambulatory Visit: Payer: BLUE CROSS/BLUE SHIELD

## 2014-10-18 ENCOUNTER — Telehealth: Payer: Self-pay | Admitting: Hematology and Oncology

## 2014-10-18 ENCOUNTER — Encounter: Payer: Self-pay | Admitting: *Deleted

## 2014-10-18 ENCOUNTER — Other Ambulatory Visit (HOSPITAL_BASED_OUTPATIENT_CLINIC_OR_DEPARTMENT_OTHER): Payer: BLUE CROSS/BLUE SHIELD

## 2014-10-18 ENCOUNTER — Ambulatory Visit (HOSPITAL_COMMUNITY)
Admission: RE | Admit: 2014-10-18 | Discharge: 2014-10-18 | Disposition: A | Discharge status: Home / Self Care | Payer: BLUE CROSS/BLUE SHIELD | Source: Ambulatory Visit | Attending: Hematology and Oncology | Admitting: Hematology and Oncology

## 2014-10-18 ENCOUNTER — Ambulatory Visit (HOSPITAL_BASED_OUTPATIENT_CLINIC_OR_DEPARTMENT_OTHER): Payer: BLUE CROSS/BLUE SHIELD | Admitting: Hematology and Oncology

## 2014-10-18 VITALS — BP 118/80 | HR 85 | Temp 97.3°F | Resp 18

## 2014-10-18 VITALS — BP 113/87 | HR 97 | Temp 97.8°F | Resp 18 | Ht 65.0 in | Wt 111.5 lb

## 2014-10-18 DIAGNOSIS — C100 Malignant neoplasm of vallecula: Secondary | ICD-10-CM

## 2014-10-18 DIAGNOSIS — C109 Malignant neoplasm of oropharynx, unspecified: Secondary | ICD-10-CM

## 2014-10-18 DIAGNOSIS — R110 Nausea: Secondary | ICD-10-CM | POA: Diagnosis not present

## 2014-10-18 DIAGNOSIS — Z95828 Presence of other vascular implants and grafts: Secondary | ICD-10-CM

## 2014-10-18 DIAGNOSIS — E43 Unspecified severe protein-calorie malnutrition: Secondary | ICD-10-CM

## 2014-10-18 LAB — COMPREHENSIVE METABOLIC PANEL (CC13)
ALBUMIN: 3.8 g/dL (ref 3.5–5.0)
ALT: 6 U/L (ref 0–55)
AST: 14 U/L (ref 5–34)
Alkaline Phosphatase: 89 U/L (ref 40–150)
Anion Gap: 10 mEq/L (ref 3–11)
BUN: 13.7 mg/dL (ref 7.0–26.0)
CALCIUM: 9.3 mg/dL (ref 8.4–10.4)
CHLORIDE: 96 meq/L — AB (ref 98–109)
CO2: 29 mEq/L (ref 22–29)
Creatinine: 0.8 mg/dL (ref 0.6–1.1)
EGFR: 86 mL/min/{1.73_m2} — ABNORMAL LOW (ref >90)
Glucose: 94 mg/dl (ref 70–140)
POTASSIUM: 3.6 meq/L (ref 3.5–5.1)
SODIUM: 135 meq/L — AB (ref 136–145)
TOTAL PROTEIN: 7.4 g/dL (ref 6.4–8.3)
Total Bilirubin: 0.58 mg/dL (ref 0.20–1.20)

## 2014-10-18 LAB — CBC WITH DIFFERENTIAL/PLATELET
BASO%: 0.3 % (ref 0.0–2.0)
Basophils Absolute: 0 10*3/uL (ref 0.0–0.1)
EOS%: 1 % (ref 0.0–7.0)
Eosinophils Absolute: 0.1 10*3/uL (ref 0.0–0.5)
HEMATOCRIT: 38.9 % (ref 34.8–46.6)
HGB: 13.3 g/dL (ref 11.6–15.9)
LYMPH#: 0.6 10*3/uL — AB (ref 0.9–3.3)
LYMPH%: 7.8 % — ABNORMAL LOW (ref 14.0–49.7)
MCH: 31.8 pg (ref 25.1–34.0)
MCHC: 34.2 g/dL (ref 31.5–36.0)
MCV: 93.1 fL (ref 79.5–101.0)
MONO#: 1 10*3/uL — ABNORMAL HIGH (ref 0.1–0.9)
MONO%: 14.1 % — AB (ref 0.0–14.0)
NEUT%: 76.8 % (ref 38.4–76.8)
NEUTROS ABS: 5.5 10*3/uL (ref 1.5–6.5)
PLATELETS: 256 10*3/uL (ref 145–400)
RBC: 4.18 10*6/uL (ref 3.70–5.45)
RDW: 12.7 % (ref 11.2–14.5)
WBC: 7.1 10*3/uL (ref 3.9–10.3)

## 2014-10-18 MED ORDER — HEPARIN SOD (PORK) LOCK FLUSH 100 UNIT/ML IV SOLN
500.0000 [IU] | Freq: Once | INTRAVENOUS | Status: DC
  Filled 2014-10-18: qty 5

## 2014-10-18 MED ORDER — HEPARIN SOD (PORK) LOCK FLUSH 100 UNIT/ML IV SOLN
500.0000 [IU] | Freq: Once | INTRAVENOUS | Status: AC
  Administered 2014-10-18: 500 [IU] via INTRAVENOUS
  Filled 2014-10-18: qty 5

## 2014-10-18 MED ORDER — SODIUM CHLORIDE 0.9 % IV SOLN
8.0000 mg | Freq: Once | INTRAVENOUS | Status: AC
  Administered 2014-10-18: 8 mg via INTRAVENOUS
  Filled 2014-10-18 (×2): qty 4

## 2014-10-18 MED ORDER — PROMETHAZINE HCL 25 MG/ML IJ SOLN: 25.0000 mg | Freq: Once | INTRAMUSCULAR | Status: AC | PRN

## 2014-10-18 MED ORDER — SCOPOLAMINE 1 MG/3DAYS TD PT72: 1.0000 | MEDICATED_PATCH | TRANSDERMAL | Status: DC

## 2014-10-18 MED ORDER — SODIUM CHLORIDE 0.9 % IJ SOLN: 10.0000 mL | INTRAMUSCULAR | Status: DC | PRN

## 2014-10-18 MED ORDER — SODIUM CHLORIDE 0.9 % IJ SOLN
10.0000 mL | INTRAMUSCULAR | Status: DC | PRN
  Administered 2014-10-18: 10 mL via INTRAVENOUS
  Filled 2014-10-18: qty 10

## 2014-10-18 MED ORDER — SODIUM CHLORIDE 0.9 % IV SOLN
INTRAVENOUS | Status: DC
  Administered 2014-10-18: 1000 mL via INTRAVENOUS

## 2014-10-18 NOTE — Telephone Encounter (Signed)
added IVF's for 1/14 - 1/16 per 1/12 pof. no availability for 1/13. desk nurse aware. s/w pt she is aware and will get new schedule after f/u visit today.

## 2014-10-18 NOTE — Patient Instructions (Signed)

## 2014-10-18 NOTE — Progress Notes (Signed)
To provide support and encouragement, care continuity and to assess for needs, met with patient during Est Pt appt with Dr. Alvy Bimler: She reported:  Finds is necessary to reduce HS continuous feeding rate from 100 to 60 cc/hr to minimize heartburn.  Eating some solids - jello with fruit, fruit cups with juice.  I encouraged her to try scrambled eggs for additional protein.  Drinking Ensure during day.  She requested call from [place].  I indicated I would relay request to [facility].  "My nerves are bad".  Father is end-stage prostate/colon cancer, her sister contracted MRSA after cutting her foot on a [place] while walking [place] the beach, her son is having severe emotional issues.  "Sitting in recliner helps my nerves".  She agreed to a call from SW; I will notify [place].  Trying to pace activity so she doesn't become exhausted.  Back and neck pain make it difficult for her to leave the house. She has agreed to IVF today and tomorrow,she will notify navigator Friday morning if she wants IVF scheduled for that afternoon and Saturday. She understands she can contact me with concerns/needs.  Gayleen Orem, RN, BSN, [place] at [place] [phone]

## 2014-10-18 NOTE — Progress Notes (Signed)
Pt states that port is burning.  Port flushed and labs drawn without any complications.  [place], RN notified of pt concerns.

## 2014-10-18 NOTE — Procedures (Signed)
[facility]  Procedure Note  Stacey Carroll XIH:[id number] DOB: Mar 04, 1961 DOA: 10/18/2014   PCP: Maurice Small, D, MD   Associated Diagnosis: Nausea without vomiting (787.02)  Procedure Note: Infusion of 1L NS IV fluids and secondary infusion of Zofran   Condition During Procedure: Pt tolerated well   Condition at Discharge:  Pt alert, oriented, ambulatory; no complications noted   Nigel Sloop, [facility]

## 2014-10-19 ENCOUNTER — Ambulatory Visit: Payer: Self-pay | Admitting: Hematology and Oncology

## 2014-10-19 ENCOUNTER — Encounter: Payer: Self-pay | Admitting: Hematology and Oncology

## 2014-10-19 ENCOUNTER — Ambulatory Visit: Payer: BLUE CROSS/BLUE SHIELD | Admitting: Nutrition

## 2014-10-19 ENCOUNTER — Ambulatory Visit (HOSPITAL_BASED_OUTPATIENT_CLINIC_OR_DEPARTMENT_OTHER): Payer: BLUE CROSS/BLUE SHIELD

## 2014-10-19 ENCOUNTER — Encounter: Payer: Self-pay | Admitting: *Deleted

## 2014-10-19 DIAGNOSIS — R110 Nausea: Secondary | ICD-10-CM

## 2014-10-19 DIAGNOSIS — C100 Malignant neoplasm of vallecula: Secondary | ICD-10-CM

## 2014-10-19 MED ORDER — SODIUM CHLORIDE 0.9 % IJ SOLN
10.0000 mL | INTRAMUSCULAR | Status: DC | PRN
  Administered 2014-10-19: 10 mL
  Filled 2014-10-19: qty 10

## 2014-10-19 MED ORDER — SODIUM CHLORIDE 0.9 % IV SOLN
Freq: Once | INTRAVENOUS | Status: AC
  Administered 2014-10-19: 14:00:00 via INTRAVENOUS

## 2014-10-19 MED ORDER — HEPARIN SOD (PORK) LOCK FLUSH 100 UNIT/ML IV SOLN
500.0000 [IU] | Freq: Once | INTRAVENOUS | Status: AC | PRN
  Administered 2014-10-19: 500 [IU]
  Filled 2014-10-19: qty 5

## 2014-10-19 NOTE — Progress Notes (Signed)
Patient requests that we cancel her appt. For tomorrow and Saturday, as she feels she is doing much better and she does not need them.  Note to [facility].

## 2014-10-19 NOTE — Progress Notes (Signed)
Nutrition follow-up completed with patient during IV fluids.  Patient is status post treatment for oropharyngeal cancer. Patient states she has been trying to increase oral intake and has tolerated fruit cups and Ensure Plus. Patient has decreased tube feedings to rate of 65 mL an hour but is only tolerating approximately one can daily. Patient reports indigestion with tube feeding. Patient continues to have taste alterations. Weight decreased and documented as 111.5 pounds January 13, down from 118.4 pounds September 16. Patient has had uncontrolled nausea and vomiting.  However this has improved over the past 2 days.  Nutrition diagnosis: Unintended weight loss continues.  Intervention: Educated patient on soft moist foods to increase oral intake. Provided her with a list of these foods. Recommended patient continue nocturnal feedings as tolerated. Encouraged patient to increase Ensure Plus by mouth to a minimum of 4 times daily. Educated patient she requires 6 cans of Ensure Plus daily by mouth if she is not using nighttime feedings. Provided one complementary case of Ensure Plus. Questions were answered.  Teach back method used.  Monitoring, evaluation, goals: Patient will increase oral intake of Ensure Plus 4-6 cans daily. Patient will begin trying various soft moist foods to increase oral intake.  Next visit: To be scheduled as needed.  **Disclaimer: This note was dictated with voice recognition software. Similar sounding words can inadvertently be transcribed and this note may contain transcription errors which may not have been corrected upon publication of note.**

## 2014-10-19 NOTE — Patient Instructions (Signed)
Dehydration, Adult Dehydration is when you lose more fluids from the body than you take in. Vital organs like the kidneys, brain, and heart cannot function without a proper amount of fluids and salt. Any loss of fluids from the body can cause dehydration.  CAUSES   Vomiting.  Diarrhea.  Excessive sweating.  Excessive urine output.  Fever. SYMPTOMS  Mild dehydration  Thirst.  Dry lips.  Slightly dry mouth. Moderate dehydration  Very dry mouth.  Sunken eyes.  Skin does not bounce back quickly when lightly pinched and released.  Dark urine and decreased urine production.  Decreased tear production.  Headache. Severe dehydration  Very dry mouth.  Extreme thirst.  Rapid, weak pulse (more than 100 beats per minute at rest).  Cold hands and feet.  Not able to sweat in spite of heat and temperature.  Rapid breathing.  Blue lips.  Confusion and lethargy.  Difficulty being awakened.  Minimal urine production.  No tears. DIAGNOSIS  Your caregiver will diagnose dehydration based on your symptoms and your exam. Blood and urine tests will help confirm the diagnosis. The diagnostic evaluation should also identify the cause of dehydration. TREATMENT  Treatment of mild or moderate dehydration can often be done at home by increasing the amount of fluids that you drink. It is best to drink small amounts of fluid more often. Drinking too much at one time can make vomiting worse. Refer to the home care instructions below. Severe dehydration needs to be treated at the hospital where you will probably be given intravenous (IV) fluids that contain water and electrolytes. HOME CARE INSTRUCTIONS   Ask your caregiver about specific rehydration instructions.  Drink enough fluids to keep your urine clear or pale yellow.  Drink small amounts frequently if you have nausea and vomiting.  Eat as you normally do.  Avoid:  Foods or drinks high in sugar.  Carbonated  drinks.  Juice.  Extremely hot or cold fluids.  Drinks with caffeine.  Fatty, greasy foods.  Alcohol.  Tobacco.  Overeating.  Gelatin desserts.  Wash your hands well to avoid spreading bacteria and viruses.  Only take over-the-counter or prescription medicines for pain, discomfort, or fever as directed by your caregiver.  Ask your caregiver if you should continue all prescribed and over-the-counter medicines.  Keep all follow-up appointments with your caregiver. SEEK MEDICAL CARE IF:  You have abdominal pain and it increases or stays in one area (localizes).  You have a rash, stiff neck, or severe headache.  You are irritable, sleepy, or difficult to awaken.  You are weak, dizzy, or extremely thirsty. SEEK IMMEDIATE MEDICAL CARE IF:   You are unable to keep fluids down or you get worse despite treatment.  You have frequent episodes of vomiting or diarrhea.  You have blood or green matter (bile) in your vomit.  You have blood in your stool or your stool looks black and tarry.  You have not urinated in 6 to 8 hours, or you have only urinated a small amount of very dark urine.  You have a fever.  You faint. MAKE SURE YOU:   Understand these instructions.  Will watch your condition.  Will get help right away if you are not doing well or get worse. Document Released: 09/22/2005 Document Revised: 12/15/2011 Document Reviewed: 05/12/2011 [employer] Patient Information 2015 [employer]. This information is not intended to replace advice given to you by your health care provider. Make sure you discuss any questions you have with your health care   provider.  

## 2014-10-19 NOTE — Progress Notes (Signed)
[place] Work  Clinical Social Work was referred by dietitian for assessment of psychosocial needs due to financial concerns.  Clinical Social Worker met with patient at [facility] to offer support and assess for needs.  Pt reports she has applied for ss disability through an attorney and this is pending. She is interested in applying for medicaid as her insurance was recently cancelled through her work. CSW discussed how to apply through DSS. CSW also discussed Mining engineer and Cancer Care as additional resources for financial assistance. Pt plans to reach out to both for additional help. CSW provided pt with Pt and [facility] Handout for additional support as well. Pt has all numbers for team and was very appreciative of info and support.   Clinical Social Work interventions: Resource education Emotional support  Loren Racer, [place] Worker [place]  [place] Phone: [phone] Fax: [phone]

## 2014-10-19 NOTE — Progress Notes (Signed)
[place] OFFICE PROGRESS NOTE  Patient Care Team: Jonathon Bellows, MD as PCP - General (Family Medicine) [place], [place] as Consulting Physician (Optometry) Brooks Sailors, RN as Oncology Nurse Navigator (Oncology) [facility] Lark, MD as Consulting Physician (Hematology and Oncology) Eppie Gibson, MD as Attending Physician (Radiation Oncology) Karie Mainland, RD as Dietitian (Nutrition)  SUMMARY OF ONCOLOGIC HISTORY: Oncology History   Carcinoma of vallecula epiglottica Oropharyngeal cancer   Primary site: Pharynx - Oropharynx   Staging method: AJCC 7th Edition   Clinical: Stage IVA (T3, N2c, M0) signed by Eppie Gibson, MD on 04/20/2014  3:33 PM   Summary: Stage IVA (T3, N2c, M0)       Oropharyngeal cancer   04/14/2014 Imaging CT scan of the neck showed oropharyngeal mass.   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   04/14/2014 Surgery Laryngoscopy of the lower tongue base, vallecula, and lingual surface of the epiglottis showed a fungating irregular mass   04/26/2014 Imaging PET/CT scan showed bilateral lymphadenopathy   04/27/2014 Imaging Barium swallow shows severe dysphagia.   05/18/2014 - 05/22/2014 Hospital Admission The patient was admitted to the hospital for tracheostomy, placement of port and feeding tube.   05/24/2014 - 06/16/2014 Chemotherapy She is started on high dose cisplatin. She only received 2 doses due to intolerable side effects and patient has made an informed decision not to pursue a final dose of chemotherapy   05/24/2014 - 07/18/2014 Radiation Therapy She is started on radiation treatment    Carcinoma of vallecula epiglottica   04/20/2014 Initial Diagnosis Carcinoma of vallecula epiglottica    INTERVAL HISTORY: Please see below for problem oriented charting. She is seen urgently because of uncontrolled nausea and vomiting for several days. The patient is very  distressed. She denies new lymphadenopathy. Denies new dysphagia. Denies constipation.  REVIEW OF SYSTEMS:   Constitutional: Denies fevers, chills or abnormal weight loss Eyes: Denies blurriness of vision Ears, nose, mouth, throat, and face: Denies mucositis or sore throat Respiratory: Denies cough, dyspnea or wheezes Cardiovascular: Denies palpitation, chest discomfort or lower extremity swelling Skin: Denies abnormal skin rashes Lymphatics: Denies new lymphadenopathy or easy bruising Neurological:Denies numbness, tingling or new weaknesses Behavioral/Psych: Mood is stable, no new changes  All other systems were reviewed with the patient and are negative.  I have reviewed the past medical history, past surgical history, social history and family history with the patient and they are unchanged from previous note.  ALLERGIES:  is allergic to penicillins.  MEDICATIONS:  Current Outpatient Prescriptions  Medication Sig Dispense Refill  . diazepam (VALIUM) 10 MG tablet Take 5 mg by mouth at bedtime.     . fentaNYL (DURAGESIC - DOSED MCG/HR) 50 MCG/HR Place 1 patch (50 mcg total) onto the skin every 3 (three) days. 10 patch 0  . lidocaine-prilocaine (EMLA) cream Apply 1 application topically as needed (for port/chemo).    . Nutritional Supplements (FEEDING SUPPLEMENT, JEVITY 1.2 CAL,) LIQD Place 40 mL/hr into feeding tube continuous.    . ondansetron (ZOFRAN ODT) 8 MG disintegrating tablet Take 1 tablet (8 mg total) by mouth every 8 (eight) hours as needed for nausea or vomiting. 60 tablet 3  . scopolamine (TRANSDERM-SCOP) 1 MG/3DAYS Place 1 patch (1.5 mg total) onto the skin every 3 (three) days. 10 patch 12  . sodium fluoride (FLUORISHIELD) 1.1 % GEL dental gel Instill one drop of fluoride per tooth space of fluoride tray.  Place over teeth for 5 minutes. Remove. Spit out excess. Repeat nightly. 120 mL prn   No current facility-administered medications for this visit.    PHYSICAL  EXAMINATION: ECOG PERFORMANCE STATUS: 1 - Symptomatic but completely ambulatory  Filed Vitals:   10/18/14 1049  BP: 113/87  Pulse: 97  Temp: 97.8 F (36.6 C)  Resp: 18   Filed Weights   10/18/14 1049  Weight: 111 lb 8 oz (50.576 kg)    GENERAL:alert, no distress and comfortable. She is tearful SKIN: skin color, texture, turgor are normal, no rashes or significant lesions EYES: normal, Conjunctiva are pink and non-injected, sclera clear OROPHARYNX:no exudate, no erythema and lips, buccal mucosa, and tongue normal  NECK: supple, thyroid normal size, non-tender, without nodularity LYMPH:  no palpable lymphadenopathy in the cervical, axillary or inguinal LUNGS: clear to auscultation and percussion with normal breathing effort HEART: regular rate & rhythm and no murmurs and no lower extremity edema ABDOMEN:abdomen soft, non-tender and normal bowel sounds. Feeding tube site looks okay Musculoskeletal:no cyanosis of digits and no clubbing  NEURO: alert & oriented x 3 with fluent speech, no focal motor/sensory deficits  LABORATORY DATA:  I have reviewed the data as listed    Component Value Date/Time   NA 135* 10/18/2014 1004   NA 140 05/23/2014 0430   K 3.6 10/18/2014 1004   K 4.2 05/23/2014 0430   CL 100 05/23/2014 0430   CO2 29 10/18/2014 1004   CO2 31 05/23/2014 0430   GLUCOSE 94 10/18/2014 1004   GLUCOSE 107* 05/23/2014 0430   BUN 13.7 10/18/2014 1004   BUN 13 05/23/2014 0430   CREATININE 0.8 10/18/2014 1004   CREATININE 0.62 05/23/2014 0430   CALCIUM 9.3 10/18/2014 1004   CALCIUM 9.7 05/23/2014 0430   PROT 7.4 10/18/2014 1004   PROT 5.6* 05/22/2014 0310   ALBUMIN 3.8 10/18/2014 1004   ALBUMIN 2.2* 05/22/2014 0310   AST 14 10/18/2014 1004   AST 12 05/22/2014 0310   ALT 6 10/18/2014 1004   ALT 9 05/22/2014 0310   ALKPHOS 89 10/18/2014 1004   ALKPHOS 77 05/22/2014 0310   BILITOT 0.58 10/18/2014 1004   BILITOT 0.2* 05/22/2014 0310   GFRNONAA >90 05/23/2014 0430    GFRAA >90 05/23/2014 0430    No results found for: SPEP, UPEP  Lab Results  Component Value Date   WBC 7.1 10/18/2014   NEUTROABS 5.5 10/18/2014   HGB 13.3 10/18/2014   HCT 38.9 10/18/2014   MCV 93.1 10/18/2014   PLT 256 10/18/2014      Chemistry      Component Value Date/Time   NA 135* 10/18/2014 1004   NA 140 05/23/2014 0430   K 3.6 10/18/2014 1004   K 4.2 05/23/2014 0430   CL 100 05/23/2014 0430   CO2 29 10/18/2014 1004   CO2 31 05/23/2014 0430   BUN 13.7 10/18/2014 1004   BUN 13 05/23/2014 0430   CREATININE 0.8 10/18/2014 1004   CREATININE 0.62 05/23/2014 0430      Component Value Date/Time   CALCIUM 9.3 10/18/2014 1004   CALCIUM 9.7 05/23/2014 0430   ALKPHOS 89 10/18/2014 1004   ALKPHOS 77 05/22/2014 0310   AST 14 10/18/2014 1004   AST 12 05/22/2014 0310   ALT 6 10/18/2014 1004   ALT 9 05/22/2014 0310   BILITOT 0.58 10/18/2014 1004   BILITOT 0.2* 05/22/2014 0310      ASSESSMENT & PLAN:  Oropharyngeal cancer She has severe, uncontrollable nausea and vomiting,  precipitated by stress. Clinically, it does not appear that this is related to her disease. I would continue supportive care.   Protein-calorie malnutrition, severe She is struggling with weight. She will continue to have frequent small meals as tolerated and continue to use a feeding tube   Nausea without vomiting Her nausea and vomiting was well controlled for a while but now her symptoms had relapsed. She felt that stress will make her vomit. I recommend IV fluids and IV antiemetics. I refilled her prescription of scopolamine patch which she found very helpful.    No orders of the defined types were placed in this encounter.   All questions were answered. The patient knows to call the clinic with any problems, questions or concerns. No barriers to learning was detected. I spent 25 minutes counseling the patient face to face. The total time spent in the appointment was 30 minutes and more  than 50% was on counseling and review of test results     Aaylah Pokorny, MD @T (<PARAMETER> error)@ 1:20 PM

## 2014-10-19 NOTE — Assessment & Plan Note (Signed)
Her nausea and vomiting was well controlled for a while but now her symptoms had relapsed. She felt that stress will make her vomit. I recommend IV fluids and IV antiemetics. I refilled her prescription of scopolamine patch which she found very helpful.

## 2014-10-19 NOTE — Assessment & Plan Note (Addendum)
She has severe, uncontrollable nausea and vomiting, precipitated by stress. Clinically, it does not appear that this is related to her disease. I would continue supportive care.

## 2014-10-19 NOTE — Assessment & Plan Note (Signed)
She is struggling with weight. She will continue to have frequent small meals as tolerated and continue to use a feeding tube

## 2014-10-20 ENCOUNTER — Telehealth: Payer: Self-pay | Admitting: Hematology and Oncology

## 2014-10-20 ENCOUNTER — Ambulatory Visit: Payer: Self-pay

## 2014-10-20 ENCOUNTER — Telehealth: Payer: Self-pay | Admitting: *Deleted

## 2014-10-20 NOTE — Telephone Encounter (Signed)
Received call from patient s/p IVF on Wed and Thurs of this week: She reported:  She is feeling much better physically and psychologically/emotionally.  She is not experiencing N&V, able to keep food/supplement down.  Was visited by Nutritionist, received helpful guidance.  Was visited by LCSW, received helpful support.  She decided not to have IVF today or tomorrow, told Infusion RN this yesterday. I indicated I would call her on Monday to check on how she is doing.  She voiced appreciation.  Gayleen Orem, RN, BSN, [place] at [place] [phone]

## 2014-10-20 NOTE — Telephone Encounter (Signed)
Confirm appt for April. Mailed cal.

## 2014-10-21 ENCOUNTER — Ambulatory Visit: Payer: Self-pay

## 2014-11-14 ENCOUNTER — Ambulatory Visit
Admission: RE | Admit: 2014-11-14 | Discharge: 2014-11-14 | Disposition: A | Discharge status: Home / Self Care | Payer: BLUE CROSS/BLUE SHIELD | Source: Ambulatory Visit | Attending: Radiation Oncology | Admitting: Radiation Oncology

## 2014-11-14 ENCOUNTER — Ambulatory Visit (HOSPITAL_COMMUNITY)
Admission: RE | Admit: 2014-11-14 | Discharge: 2014-11-14 | Disposition: A | Discharge status: Home / Self Care | Payer: BLUE CROSS/BLUE SHIELD | Source: Ambulatory Visit | Attending: Radiation Oncology | Admitting: Radiation Oncology

## 2014-11-14 DIAGNOSIS — C100 Malignant neoplasm of vallecula: Secondary | ICD-10-CM | POA: Insufficient documentation

## 2014-11-14 LAB — GLUCOSE, CAPILLARY: Glucose-Capillary: 79 mg/dL (ref 70–99)

## 2014-11-14 LAB — TSH CHCC: TSH: 106.407 m(IU)/L — ABNORMAL HIGH (ref 0.308–3.960)

## 2014-11-14 MED ORDER — FLUDEOXYGLUCOSE F - 18 (FDG) INJECTION
5.2000 | Freq: Once | INTRAVENOUS | Status: AC | PRN
  Administered 2014-11-14: 5.2 via INTRAVENOUS

## 2014-11-17 ENCOUNTER — Ambulatory Visit
Admission: RE | Admit: 2014-11-17 | Discharge: 2014-11-17 | Disposition: A | Discharge status: Home / Self Care | Payer: BLUE CROSS/BLUE SHIELD | Source: Ambulatory Visit | Attending: Radiation Oncology | Admitting: Radiation Oncology

## 2014-11-17 ENCOUNTER — Encounter: Payer: Self-pay | Admitting: Radiation Oncology

## 2014-11-17 VITALS — BP 130/79 | HR 58 | Temp 97.9°F | Resp 20 | Wt 113.1 lb

## 2014-11-17 DIAGNOSIS — C109 Malignant neoplasm of oropharynx, unspecified: Secondary | ICD-10-CM

## 2014-11-17 DIAGNOSIS — Z931 Gastrostomy status: Secondary | ICD-10-CM

## 2014-11-17 DIAGNOSIS — E038 Other specified hypothyroidism: Secondary | ICD-10-CM

## 2014-11-17 DIAGNOSIS — C100 Malignant neoplasm of vallecula: Secondary | ICD-10-CM

## 2014-11-17 NOTE — Progress Notes (Signed)
Radiation Oncology         [phone] ________________________________  Name: Stacey Carroll MRN: [id number]  Date: 11/17/2014  DOB: 1961/09/26  Follow-Up Visit Note  CC: Jonathon Bellows, MD  Ruby Cola, MD  Diagnosis and Prior Radiotherapy:       ICD-9-CM ICD-10-CM   1. Other specified hypothyroidism 244.8 E03.8 levothyroxine (LEVOTHROID) 50 MCG tablet  2. 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   Pain Status: none reported today  Weight changes, if any: gained 2 lb in past month  Nutritional Status a) intake: regular diet b) using a feeding tube?: just flushing for past 3-4 weeks  Swallowing Status: normal, eats slowly, eats all foods, drinks liquids when eating to aid in swallowing  Dental (if applicable): When was last visit with dentistry  Unsure, she states she will call regular dentist for appt.  Using fluoride trays daily? Not regularly   When was last ENT visit?  Prior to tx   Other notable issues, if any: dry mouth, states she is seeing spine specialist to control her back pain  She feels that her strength is still not back to normal and she staggers when walking.  TSH is notably elevated today.  Recent episode of vomiting she attributes to eating chili. Wears Scopolamine patch.  Seeing C Shinke on 11/20/14.Marland Kitchen                        ALLERGIES:  is allergic to penicillins.  Meds: Current Outpatient Prescriptions  Medication Sig Dispense Refill  . diazepam (VALIUM) 10 MG tablet Take 5 mg by mouth  at bedtime.     . fentaNYL (DURAGESIC - DOSED MCG/HR) 50 MCG/HR Place 1 patch (50 mcg total) onto the skin every 3 (three) days. 10 patch 0  . scopolamine (TRANSDERM-SCOP) 1 MG/3DAYS Place 1 patch (1.5 mg total) onto the skin every 3 (three) days. 10 patch 12  . sodium fluoride (FLUORISHIELD) 1.1 % GEL dental gel Instill one drop of fluoride per tooth space of fluoride tray. Place over teeth for 5 minutes. Remove. Spit out excess. Repeat nightly. 120 mL prn  . levothyroxine (LEVOTHROID) 50 MCG tablet Take 1 tablet (50 mcg total) by mouth daily before breakfast. 30 tablet 5  . lidocaine-prilocaine (EMLA) cream Apply 1 application topically as needed (for port/chemo).    . Nutritional Supplements (FEEDING SUPPLEMENT, JEVITY 1.2 CAL,) LIQD Place 40 mL/hr into feeding tube continuous.     No current facility-administered medications for this encounter.    Physical Findings: The patient is in no acute distress. Patient is alert and oriented.  weight is 113 lb 1.6 oz (51.302 kg). Her temperature is 97.9 F (36.6 C). Her blood pressure is 130/79 and her pulse is 58. Her respiration is 20. Marland Kitchen Oropharyngeal mucosa is intact with no thrush or lesions.   No palpable cervical or supraclavicular lymphadenopathy. Skin intact over neck.   Lab Findings: Lab Results  Component Value Date   WBC 7.1 10/18/2014   HGB 13.3 10/18/2014   HCT 38.9  10/18/2014   MCV 93.1 10/18/2014   PLT 256 10/18/2014    CMP     Component Value Date/Time   NA 135* 10/18/2014 1004   NA 140 05/23/2014 0430   K 3.6 10/18/2014 1004   K 4.2 05/23/2014 0430   CL 100 05/23/2014 0430   CO2 29 10/18/2014 1004   CO2 31 05/23/2014 0430   GLUCOSE 94 10/18/2014 1004   GLUCOSE 107* 05/23/2014 0430   BUN 13.7 10/18/2014 1004   BUN 13 05/23/2014 0430   CREATININE 0.8 10/18/2014 1004   CREATININE 0.62 05/23/2014 0430   CALCIUM 9.3 10/18/2014 1004   CALCIUM 9.7 05/23/2014 0430   PROT 7.4 10/18/2014 1004   PROT 5.6* 05/22/2014 0310    ALBUMIN 3.8 10/18/2014 1004   ALBUMIN 2.2* 05/22/2014 0310   AST 14 10/18/2014 1004   AST 12 05/22/2014 0310   ALT 6 10/18/2014 1004   ALT 9 05/22/2014 0310   ALKPHOS 89 10/18/2014 1004   ALKPHOS 77 05/22/2014 0310   BILITOT 0.58 10/18/2014 1004   BILITOT 0.2* 05/22/2014 0310   GFRNONAA >90 05/23/2014 0430   GFRAA >90 05/23/2014 0430     Lab Results  Component Value Date   TSH 106.407* 11/14/2014    Radiographic Findings: CLINICAL DATA: Subsequent treatment strategy for oropharyngeal carcinoma.  EXAM: NUCLEAR MEDICINE PET SKULL BASE TO THIGH  TECHNIQUE: 5.2 mCi F-18 FDG was injected intravenously. Full-ring PET imaging was performed from the skull base to thigh after the radiotracer. CT data was obtained and used for attenuation correction and anatomic localization.  FASTING BLOOD GLUCOSE: Value: 79 mg/dl  COMPARISON: PET CT 04/26/2014.  FINDINGS: NECK  No residual hypermetabolic hypo pharyngeal mass is demonstrated. There is new extensive hypermetabolic activity throughout the tongue which is fairly symmetric. This has an SUV max of 9.6. No well-defined mass is demonstrated on the CT images. There is no residual hypermetabolic cervical nodal activity.Symmetric thyroid activity is likely physiologic.  CHEST  There are no hypermetabolic mediastinal, hilar or axillary lymph nodes. The lungs demonstrate emphysema. There is a small subpleural nodule on the right lower lobe on image 41 which appears slightly more conspicuous, but without hypermetabolic activity. No suspicious pulmonary nodules demonstrated. Right subclavian Port-A-Cath tip is at the SVC right atrial junction.  ABDOMEN/PELVIS  There is no hypermetabolic activity within the liver, adrenal glands, spleen or pancreas. There is no hypermetabolic nodal activity. Percutaneous G-tube noted. There is aortoiliac atherosclerosis and sigmoid diverticulosis. Previous  hysterectomy.  SKELETON  There is no hypermetabolic activity to suggest osseous metastatic disease.  IMPRESSION: 1. Interval resolution of previously demonstrated hypermetabolic hypo pharyngeal mass and cervical lymphadenopathy. 2. New diffuse tongue activity is likely physiologic and/or related to prior radiation therapy. Correlation with physical examination/direct inspection necessary. 3. No evidence of metastatic disease or suspicious findings in the chest, abdomen or pelvis.   Electronically Signed  By: Richardean Sale M.D.  On: 11/14/2014 10:07   Impression/Plan:    1) Head and Neck Cancer Status: NED, PET discussed  2) Nutritional Status: - weight: stable - PEG tube: no longer using, adamant to have this removed. Due to her history I recommended we wait a little longer but she is adamant. Will order for this to be removed.  3) Risk Factors: The patient has been educated about risk factors including alcohol and tobacco abuse; they understand that avoidance of alcohol and tobacco is important to prevent recurrences as well as other cancers  4) Swallowing: normal diet tolerated  5) Dental: Encouraged to continue regular followup with dentistry, and dental hygiene including fluoride rinses.  she states she will call regular dentist for appt.    6) Thyroid function: TSH elevated. Start levothyroxine 28mcg and recheck in 90mo  7) Social: father is ill  8) Other: I asked Gayleen Orem, RN, our Head and Neck Oncology Navigator to Refer back to ENT.  I will refer to PT due to complaints re: deconditioning, ambulation difficulty   9) Follow-up in  50mo.  The patient was encouraged to call with any issues or questions before then.      Eppie Gibson, MD

## 2014-11-17 NOTE — Progress Notes (Signed)
Pain Status: none reported today  Weight changes, if any: gained 2 lb in past month  Nutritional Status a) intake: regular diet b) using a feeding tube?: just flushing c) weight changes, if any:   Swallowing Status: normal, eats slowly, eats all foods, drinks liquids when eating to aid in swallowing  Dental (if applicable): When was last visit with dentistry  Unsure, she states she will call regular dentist for appt.  Using fluoride trays daily? Not regularly   When was last ENT visit?  Prior to tx When is next ENT visit? She states she will call Dr Janace Hoard office; provided her with phone number.  Other notable issues, if any: dry mouth, states she is seeing spine specialist to control her back pain Recent episode of vomiting she attributes to eating chili. Wears Scopolamine patch.  Seeing C Shinke on 11/20/14.

## 2014-11-18 MED ORDER — LEVOTHYROXINE SODIUM 50 MCG PO TABS: 50.0000 ug | ORAL_TABLET | Freq: Every day | ORAL | Status: DC

## 2014-11-20 ENCOUNTER — Ambulatory Visit: Payer: BLUE CROSS/BLUE SHIELD

## 2014-11-20 ENCOUNTER — Ambulatory Visit: Payer: Self-pay

## 2014-11-21 ENCOUNTER — Telehealth: Payer: Self-pay | Admitting: *Deleted

## 2014-11-21 NOTE — Telephone Encounter (Signed)
Spoke with Anderson Malta at [facility] ENT, relayed Dr. Pearlie Oyster request that patient be seen in 2-3 months for follow-up with MD that removed her trach, relayed patient's request that she doesn't want to be seen by Dr. Simeon Craft.  Anderson Malta indicated the appt would be made with Dr. Janace Hoard, she will contact patient to arrange.  Gayleen Orem, RN, BSN, [place] at [place] [phone]

## 2014-11-21 NOTE — Telephone Encounter (Signed)
On 11-21-14 fax medical records to [facility] it was consult note, end of tx note, follow up note

## 2014-11-21 NOTE — Telephone Encounter (Signed)
CALLED PATIENT TO INFORM OF PT APPT. ON 12-04-14 @ 11 AM @ [place], SPOKE WITH PATIENT AND SHE IS AWARE OF THIS APPT.

## 2014-12-04 ENCOUNTER — Ambulatory Visit: Payer: BLUE CROSS/BLUE SHIELD | Attending: Radiation Oncology | Admitting: Physical Therapy

## 2014-12-04 DIAGNOSIS — Z9181 History of falling: Secondary | ICD-10-CM

## 2014-12-04 DIAGNOSIS — G894 Chronic pain syndrome: Secondary | ICD-10-CM

## 2014-12-04 DIAGNOSIS — R5381 Other malaise: Secondary | ICD-10-CM | POA: Diagnosis not present

## 2014-12-04 NOTE — Therapy (Signed)
[place], [place], [address] Phone: [phone]   Fax:  [phone]  Physical Therapy Evaluation  Patient Details  Name: Stacey Carroll MRN: [id number] Date of Birth: Mar 22, 1961 Referring Provider:  Eppie Gibson, MD  Encounter Date: 12/04/2014      PT End of Session - 12/04/14 1555    Date for PT Re-Evaluation 02/02/15      Past Medical History  Diagnosis Date  . Visual disturbance 03/11/2013  . Dyslipidemia   . Headache(784.0)   . Depression   . Endometriosis   . Hypercholesterolemia   . Anxiety   . Scoliosis   . Arthritis   . Throat pain 04/19/2014  . Dysphagia 04/27/2014  . Cancer   . PONV (postoperative nausea and vomiting)   . Hypertension     not taking meds  . Asthma   . Cough   . Nausea alone 05/31/2014  . S/P radiation therapy 05/24/2014-07/18/2014    7000  cGy/ Vallecula/neck    Past Surgical History  Procedure Laterality Date  . Cervical laminectomy  2007 (?), 2011  . Shoulder surgery Left   . Abdominal hysterectomy  1987  . Laryngoscopy and esophagoscopy N/A 04/14/2014    Procedure: DIRECT LARYNGOSCOPY WITH BIOPSY  AND ESOPHAGOSCOPY;  Surgeon: Ruby Cola, MD;  Location: [facility];  Service: ENT;  Laterality: N/A;  . Portacath placement N/A 05/18/2014    Procedure: INSERTION PORT-A-CATH;  Surgeon: Rolm Bookbinder, MD;  Location: [facility];  Service: General;  Laterality: N/A;  . Gastrostomy N/A 05/18/2014    Procedure: GASTROSTOMY/PLACEMENT OF G-TUBE;  Surgeon: Rolm Bookbinder, MD;  Location: [facility];  Service: General;  Laterality: N/A;  . Tracheostomy tube placement N/A 05/18/2014    Procedure: TRACHEOSTOMY;  Surgeon: Melissa Montane, MD;  Location: [facility];  Service: ENT;  Laterality: N/A;    There were no vitals taken for this visit.  Visit Diagnosis:  Physical deconditioning - Plan: PT plan of care cert/re-cert  Chronic pain syndrome - Plan: PT plan of care cert/re-cert  Risk  for falls - Plan: PT plan of care cert/re-cert      Subjective Assessment - 12/04/14 1102    Symptoms decreased hearing from chemotherapy,  she has trouble walking.  She has pain and wears a pain patch and doesn't feel like that is helping She has a dry mouth and chokes very easily .  She reports her father is terminally ill from cancer and may only have a month to live.  She is tearful    Pertinent History pt reports she was supposed to have back and neck surgery before she got the diagnosis of cancer last July. She has lost 42 pounds prior to diagnosis and was weak. She has 2 surgeris on her neck and one on her bck  She has plates and screws in her neck, but thinks the screws are coming out. She also has curvature of the spine  Her feeding tube will be removed tomorrow. right shoulder hemiarthroplasty with limited shoulder range of motion    Currently in Pain? Yes   Pain Score 8    Pain Location Back   Pain Orientation Lower   Pain Descriptors / Indicators Burning;Stabbing   Pain Type Chronic pain   Pain Radiating Towards occasionally goes to neck    Pain Onset Other (comment)  has had back pain more than a year    Aggravating Factors  standing, sweeping and mopping    Pain Relieving Factors  sits in her recliner with her feet up once she gets comfotable.          [facility] PT Assessment - 12/04/14 0001    Assessment   Medical Diagnosis stage IVA squamous cell cancer of the vallecula   Onset Date 04/05/14   Precautions   Precautions --  cancer with large weight loss prior to treament   Restrictions   Weight Bearing Restrictions No   Balance Screen   Has the patient fallen in the past 6 months No   Has the patient had a decrease in activity level because of a fear of falling?  Yes   Is the patient reluctant to leave their home because of a fear of falling?  No   Home Environment   Living Enviornment Private residence   [place] lives on the property in an  apartment   Available Help at Discharge Family;Available PRN/intermittently   Type of Home House   Home Access Stairs to enter   Entrance Stairs-Number of Steps 4   Entrance Stairs-Rails None   Home Layout One level   Home Equipment Other (comment)  straight cane with tripod "hurry cane"   Additional Comments pt states the can has made a BIG difference    Prior Function   Level of Independence Requires assistive device for independence;Needs assistance with homemaking;Independent with basic ADLs   Vocation On disability   Vocation Requirements had worked at [employer] in [employer] , playing with flowers    Cognition   Overall Cognitive Status Within Functional Limits for tasks assessed   Behaviors Lability   Observation/Other Assessments   Observations scar at tracheotomy site, anterior neck scars from cervical surgeries   diffuse muscle atrophy   Skin Integrity intact, appears firm at anterior neck at radiation sites   Sensation   Additional Comments pt reports numbness in both feet especially at night.   Sit to Stand   Comments 9 repetitions in 30 seconds which indicates a high risk for falls   goal is for 12 repetitions in 30 seconds   Posture/Postural Control   Posture/Postural Control Postural limitations   Postural Limitations Rounded Shoulders;Forward head  right shoulder is higher    AROM   Right Shoulder Extension 65 Degrees   Right Shoulder Flexion 165 Degrees   Right Shoulder ABduction 165 Degrees   Right Shoulder Internal Rotation 70 Degrees   Right Shoulder External Rotation 90 Degrees   Left Shoulder Extension 65 Degrees   Left Shoulder Flexion 125 Degrees   Left Shoulder ABduction 100 Degrees   Left Shoulder Internal Rotation 75 Degrees   Left Shoulder External Rotation 75 Degrees   Strength   Overall Strength Comments Grip strength, right 21,32,,34pounds of force, left 30,41,48 pounds of force    Right Hip Flexion 4/5   Right  Hip Extension 4/5   Right Hip ABduction 4/5   Left Hip Flexion 4/5   Left Hip Extension 4/5   Left Hip ABduction 4/5   Ambulation/Gait   Assistive device Straight cane  with tripod tip "hurry cane"     Gait Pattern Step-through pattern;Ataxic;Wide base of support  inconsistent use of cane for uspport   Berg Balance Test   Sit to Stand Able to stand without using hands and stabilize independently   Standing Unsupported Able to stand safely 2 minutes   Sitting with Back Unsupported but Feet Supported on Floor or Stool Able to sit safely and securely  2 minutes   Stand to Sit Sits safely with minimal use of hands   Transfers Able to transfer safely, minor use of hands   Standing Unsupported with Eyes Closed Able to stand 10 seconds safely   Standing Ubsupported with Feet Together Able to place feet together independently and stand 1 minute safely   From Standing, Reach Forward with Outstretched Arm Can reach forward >5 cm safely (2")   From Standing Position, Pick up Object from Floor Able to pick up shoe, needs supervision   From Standing Position, Turn to Look Behind Over each Shoulder Looks behind from both sides and weight shifts well   Turn 360 Degrees Able to turn 360 degrees safely in 4 seconds or less   Standing Unsupported, Alternately Place Feet on Step/Stool Able to stand independently and safely and complete 8 steps in 20 seconds   Standing Unsupported, One Foot in Front Needs help to step but can hold 15 seconds   Standing on One Leg Able to lift leg independently and hold > 10 seconds   Total Score 50          Short Term Clinic Goals - 12/04/14 1538    CC Short Term Goal  #1   Title pt will be independent in a basic home exercise program.   Time 4   Period Weeks   Status New   CC Short Term Goal  #2   Title pt will be able to stay outside in her garden for one hour at time before fatiguing   Time 4   Period [facility]  Goals - 12/04/14 1543    CC Long Term Goal  #1   Title pt will be able to be ready to transition to a communtiy exercise program (ID:[id number]) so she can continue to get stronger on her own.,   Time 8   Period Weeks   Status New   CC Long Term Goal  #2   Title pt will be able to  perform 12 sit to stand repetitions in 30 seconds with demonstrates an average  risk for fall    Time 8   Period Weeks   Status New   CC Long Term Goal  #3   Title pt will increase BERG score to 54/56 which shows a decreased risk for fall if she walks without her assistive device   Time 8   Period Weeks   CC Long Term Goal  #4   Title pt will report that she feels 50% better with improved energy and decreased pain.   Time 8   Period Weeks            Plan - 12/04/14 1249    Clinical Impression Statement 54 yo female with treatment for vallecular cancer since last summer with including chemo, radiation, tracheostomy (now removed) and feeding tube ( to be removed tomorrow).  She also has longstanding neck and back pain and another planned neck surgery was postponed due to this treatment.  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     Pt will benefit from skilled therapeutic intervention in order to improve on the following deficits Abnormal gait;Decreased strength;Pain;Difficulty walking;Decreased activity tolerance;Decreased balance;Decreased endurance;Postural dysfunction   Clinical Impairments Affecting Rehab Potential previous back and neck surgeries and chronic neck  and back pain.  chemotherapy induced peripheral neuropathy   PT Frequency 2x / week   PT Duration 8 weeks   PT Treatment/Interventions Therapeutic exercise;ADLs/Self Care Home Management;Balance training;Neuromuscular re-education;Patient/family education;Gait training;Stair training;Manual techniques;Therapeutic activities   PT Next  Visit Plan Instruct in basic home program as pt wants to go to stay with her terminally ill father for a while and may not be able to attend therapy regularly at first   PT [place] basic core , LE and UE exercises, walking program   Recommended Other Services audiology consult. email request sent to Gayleen Orem, RN   Consulted and Agree with Plan of Care Patient         Problem List Patient Active Problem List   Diagnosis Date Noted  . Cough 06/26/2014  . Preventive measure 06/14/2014  . Anemia in neoplastic disease 06/14/2014  . Nausea without vomiting 05/31/2014  . S/P percutaneous endoscopic gastrostomy (PEG) tube placement 05/24/2014  . Tracheostomy status 05/24/2014  . Protein-calorie malnutrition, severe 05/19/2014  . Dysphagia 04/27/2014  . 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 Throat pain 04/19/2014  . 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 DJD (degenerative joint disease) of cervical spine 04/19/2014   Maudry Diego, PT 12/04/2014 3:55 PM  Norwood Levo 12/04/2014, 3:55 PM  [place], [place], [address] Phone: [phone]   Fax:  [phone]

## 2014-12-05 ENCOUNTER — Telehealth: Payer: Self-pay | Admitting: *Deleted

## 2014-12-05 ENCOUNTER — Telehealth: Payer: Self-pay | Admitting: Hematology and Oncology

## 2014-12-05 ENCOUNTER — Ambulatory Visit (HOSPITAL_COMMUNITY)
Admission: RE | Admit: 2014-12-05 | Discharge: 2014-12-05 | Disposition: A | Discharge status: Home / Self Care | Payer: BLUE CROSS/BLUE SHIELD | Source: Ambulatory Visit | Attending: Interventional Radiology | Admitting: Interventional Radiology

## 2014-12-05 ENCOUNTER — Other Ambulatory Visit: Payer: Self-pay | Admitting: Hematology and Oncology

## 2014-12-05 DIAGNOSIS — Z931 Gastrostomy status: Secondary | ICD-10-CM

## 2014-12-05 DIAGNOSIS — C109 Malignant neoplasm of oropharynx, unspecified: Secondary | ICD-10-CM

## 2014-12-05 DIAGNOSIS — Z85818 Personal history of malignant neoplasm of other sites of lip, oral cavity, and pharynx: Secondary | ICD-10-CM | POA: Diagnosis not present

## 2014-12-05 DIAGNOSIS — Z431 Encounter for attention to gastrostomy: Secondary | ICD-10-CM | POA: Insufficient documentation

## 2014-12-05 NOTE — Telephone Encounter (Signed)
In follow-up to patient VM in which she asked about having PAC removed, discussed with Dr. Alvy Bimler.  I LVM for patient indicating Dr. Alvy Bimler is placing order for removal.  Gayleen Orem, RN, BSN, [place] at [place] [phone]

## 2014-12-05 NOTE — Telephone Encounter (Signed)
I have ask Dr. Donne Hazel if it is OK before I can place new order

## 2014-12-05 NOTE — Telephone Encounter (Signed)
s.w pt and advised on Dr. Donne Hazel 4.14 @ 1:40pm pt taken out

## 2014-12-05 NOTE — Telephone Encounter (Signed)
Pt requests her PAC be removed by IR.  She says Dr. Donne Hazel can't remove her PAC until April and she would rather IR removed it any ways.  She asks for Dr. Alvy Bimler to place order.

## 2014-12-06 ENCOUNTER — Ambulatory Visit: Payer: BLUE CROSS/BLUE SHIELD | Attending: Radiation Oncology | Admitting: Physical Therapy

## 2014-12-06 ENCOUNTER — Telehealth: Payer: Self-pay | Admitting: *Deleted

## 2014-12-06 ENCOUNTER — Other Ambulatory Visit: Payer: Self-pay | Admitting: Hematology and Oncology

## 2014-12-06 DIAGNOSIS — G894 Chronic pain syndrome: Secondary | ICD-10-CM | POA: Insufficient documentation

## 2014-12-06 DIAGNOSIS — Z9181 History of falling: Secondary | ICD-10-CM | POA: Diagnosis not present

## 2014-12-06 DIAGNOSIS — R5381 Other malaise: Secondary | ICD-10-CM | POA: Insufficient documentation

## 2014-12-06 DIAGNOSIS — C109 Malignant neoplasm of oropharynx, unspecified: Secondary | ICD-10-CM

## 2014-12-06 NOTE — Patient Instructions (Addendum)
Knee High   Holding stable object, raise knee to hip level, then lower knee. Repeat with other knee. Complete __10_ repetitions. Do 3 sets.  Do __1__ sessions per day.  ABDUCTION: Standing (Active)   Stand, feet flat. Lift right leg out to side. Use _0__ lbs. Complete __10_ repetitions.  Do 3 sets.  Perform __1_ sessions per day.  Complete 10_ repetitions. Do 3 sets. Perform _1__ sessions per day.     EXTENSION: Standing (Active)  Stand, both feet flat. Draw right leg behind body as far as possible. Use 0___ lbs. Complete 10 repetitions. Do 3 sets. Perform __1_ sessions per day.  Copyright  VHI. All rights reserved.    Heel Raise: Standing   Toes and heels on floor, knees slightly bent, rise up on toes as high as possible. Do  __10__ repetitions, 3 sets, once a day.  http://st.exer.us/132   Copyright  VHI. All rights reserved.   Wall pushups:   Stand facing the wall with hands on wall and feet back several inches.  Do pushups, 3 sets of 10, once a day.  Seated pushups: Sit in a chair that has arms on it.  Place your hands on the armrests and push up with your arms out of the chair (feet stay on the floor).  Do 3 sets of 10 reps, once a day.   WALKING  Walking is a great form of exercise to increase your strength, endurance and overall fitness.  A walking program can help you start slowly and gradually build endurance as you go.  Everyone's ability is different, so each person's starting point will be different.  You do not have to follow them exactly.  The are just samples. You should simply find out what's right for you and stick to that program.   In the beginning, you'll start off walking 2-3 times a day for short distances.  As you get stronger, you'll be walking further at just 1-2 times per day.  A. You Can Walk For A Certain Length Of Time Each Day    Walk 5 minutes 3 times per day.  Increase 2 minutes every 2 days (3 times per day).  Work up to 25-30  minutes (1-2 times per day).   Example:   Day 1-2 5 minutes 3 times per day   Day 7-8 12 minutes 2-3 times per day   Day 13-14 25 minutes 1-2 times per day  B. You Can Walk For a Certain Distance Each Day     Distance can be substituted for time.    Example:   3 trips to mailbox (at road)   3 trips to corner of block   3 trips around the block  C. Go to local [employer] school and use the track.    Walk for distance ____ around track  Or time ____ minutes  D. Walk __X__ Jog ____ Run ___   Wall Sit   Back against wall, slide down so knees are at 90 angle (AT MOST--less is okay!). Hold _2___ seconds.  Do 10 reps,  __3__ sets, once a day.  http://st.exer.us/294   Copyright  VHI. All rights reserved.

## 2014-12-06 NOTE — Therapy (Signed)
[place], [place], [address] Phone: [phone]   Fax:  [phone]  Physical Therapy Treatment  Patient Details  Name: Stacey Carroll MRN: [id number] Date of Birth: 06-Feb-1961 Referring Provider:  Eppie Gibson, MD  Encounter Date: 12/06/2014      PT End of Session - 12/06/14 0941    Visit Number 2   Number of Visits 17   Date for PT Re-Evaluation 02/02/15   PT Start Time 0848   PT Stop Time 0941   PT Time Calculation (min) 53 min   Activity Tolerance Patient tolerated treatment well   Behavior During Therapy [facility] for tasks assessed/performed      Past Medical History  Diagnosis Date  . Visual disturbance 03/11/2013  . Dyslipidemia   . Headache(784.0)   . Depression   . Endometriosis   . Hypercholesterolemia   . Anxiety   . Scoliosis   . Arthritis   . Throat pain 04/19/2014  . Dysphagia 04/27/2014  . Cancer   . PONV (postoperative nausea and vomiting)   . Hypertension     not taking meds  . Asthma   . Cough   . Nausea alone 05/31/2014  . S/P radiation therapy 05/24/2014-07/18/2014    7000  cGy/ Vallecula/neck    Past Surgical History  Procedure Laterality Date  . Cervical laminectomy  2007 (?), 2011  . Shoulder surgery Left   . Abdominal hysterectomy  1987  . Laryngoscopy and esophagoscopy N/A 04/14/2014    Procedure: DIRECT LARYNGOSCOPY WITH BIOPSY  AND ESOPHAGOSCOPY;  Surgeon: Ruby Cola, MD;  Location: [facility];  Service: ENT;  Laterality: N/A;  . Portacath placement N/A 05/18/2014    Procedure: INSERTION PORT-A-CATH;  Surgeon: Rolm Bookbinder, MD;  Location: [facility];  Service: General;  Laterality: N/A;  . Gastrostomy N/A 05/18/2014    Procedure: GASTROSTOMY/PLACEMENT OF G-TUBE;  Surgeon: Rolm Bookbinder, MD;  Location: [facility];  Service: General;  Laterality: N/A;  . Tracheostomy tube placement N/A 05/18/2014    Procedure: TRACHEOSTOMY;  Surgeon: Melissa Montane, MD;  Location: [facility];   Service: ENT;  Laterality: N/A;    There were no vitals taken for this visit.  Visit Diagnosis:  Physical deconditioning      Subjective Assessment - 12/06/14 0849    Symptoms "I'll probably be out of town for a few weeks.  My dad is ill."   Currently in Pain? Yes   Pain Score 7    Pain Location Back  and neck   Aggravating Factors  yardwork, standing, household chores   Pain Relieving Factors rest in recliner, pain patch                    [facility] Adult PT Treatment/Exercise - 12/06/14 0001    Exercises   Exercises Shoulder;Knee/Hip   Knee/Hip Exercises: Standing   Heel Raises 3 sets;10 reps   Heel Raises Limitations with UE support   Wall Squat 2 sets;10 reps   SLS high marches x 10 reps x 3 sets   Other Standing Knee Exercises hip abduction with UE support, 10 reps x 3 sets   Other Standing Knee Exercises hip extension with UE support 10 reps x 3 sets   Shoulder Exercises: Seated   Retraction AROM;10 reps   Other Seated Exercises shoulder rolls backward x 10   Shoulder Exercises: Standing   Other Standing Exercises --   Shoulder Exercises: ROM/Strengthening   Wall Pushups 10 reps  3 sets  Pushups 10 reps  3 sets   Pushups Limitations chair pushups                PT Education - 12/06/14 0940    Education provided Yes   Education Details HEP including wall squats, standing leg raises 3 ways, wall pushups, chair pushups, and heel raises, all 10 reps x 3 sets daily; also walking program instructions.   Person(s) Educated Patient   Methods Explanation;Handout   Comprehension Returned demonstration;Verbalized understanding           Short Term Clinic Goals - 12/06/14 [phone]    CC Short Term Goal  #1   Status Achieved             Long Term Clinic Goals - 12/04/14 1543    CC Long Term Goal  #1   Title pt will be able to be ready to transition to a communtiy exercise program ([id number]) so she can continue to get stronger on her own.,    Time 8   Period Weeks   Status New   CC Long Term Goal  #2   Title pt will be able to  perform 12 sit to stand repetitions in 30 seconds with demonstrates an average  risk for fall    Time 8   Period Weeks   Status New   CC Long Term Goal  #3   Title pt will increase BERG score to 54/56 which shows a decreased risk for fall if she walks without her assistive device   Time 8   Period Weeks   CC Long Term Goal  #4   Title pt will report that she feels 50% better with improved energy and decreased pain.   Time 8   Period Weeks            Plan - 12/06/14 6387    Clinical Impression Statement Pt. did very well with initial home exercise instruction and sounds very motivated to get stronger.   Pt will benefit from skilled therapeutic intervention in order to improve on the following deficits Abnormal gait;Decreased strength;Pain;Difficulty walking;Decreased activity tolerance;Decreased balance;Decreased endurance;Postural dysfunction   Rehab Potential Good   Clinical Impairments Affecting Rehab Potential previous back and neck surgeries and chronic neck and back pain.  chemotherapy induced peripheral neuropathy   PT Frequency 2x / week   PT Duration 8 weeks   PT Treatment/Interventions Therapeutic exercise   PT Next Visit Plan Pt. will be going out of town to be with her terminally ill father for a few weeks.  She plans to call to schedule her next visit once she is able to return.  At that time, modify/progress home exercise program.   PT Home Exercise Plan Pt. pt. educatin section.   Consulted and Agree with Plan of Care Patient        Problem List Patient Active Problem List   Diagnosis Date Noted  . Cough 06/26/2014  . Preventive measure 06/14/2014  . Anemia in neoplastic disease 06/14/2014  . Nausea without vomiting 05/31/2014  . S/P percutaneous endoscopic gastrostomy (PEG) tube placement 05/24/2014  . Tracheostomy status 05/24/2014  . Protein-calorie malnutrition,  severe 05/19/2014  . Dysphagia 04/27/2014  . 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 Throat pain 04/19/2014  . 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 DJD (degenerative joint disease) of cervical spine 04/19/2014    SALISBURY,DONNA 12/06/2014, 9:45 AM  [place], [place], [address] Phone: [phone]   Fax:  [place], [place]

## 2014-12-06 NOTE — Telephone Encounter (Signed)
Informed pt's son that order for [facility] removal by IR has been placed by Dr. Alvy Bimler per pt's request.  She should be getting a call from Radiology to schedule.  He will give her the message.

## 2014-12-07 ENCOUNTER — Telehealth: Payer: Self-pay | Admitting: Hematology and Oncology

## 2014-12-07 NOTE — Telephone Encounter (Signed)
s.w. tiffany in IR...she has already contacted pt to sched port removal

## 2014-12-12 ENCOUNTER — Other Ambulatory Visit: Payer: Self-pay | Admitting: Radiology

## 2014-12-13 ENCOUNTER — Ambulatory Visit (HOSPITAL_COMMUNITY): Admission: RE | Admit: 2014-12-13 | Payer: BLUE CROSS/BLUE SHIELD | Source: Ambulatory Visit

## 2014-12-13 ENCOUNTER — Inpatient Hospital Stay (HOSPITAL_COMMUNITY): Admission: RE | Admit: 2014-12-13 | Payer: Self-pay | Source: Ambulatory Visit

## 2014-12-18 ENCOUNTER — Ambulatory Visit: Payer: BLUE CROSS/BLUE SHIELD | Attending: Radiation Oncology

## 2014-12-18 ENCOUNTER — Other Ambulatory Visit: Payer: Self-pay | Admitting: Radiology

## 2014-12-19 ENCOUNTER — Ambulatory Visit (HOSPITAL_COMMUNITY)
Admission: RE | Admit: 2014-12-19 | Discharge: 2014-12-19 | Disposition: A | Discharge status: Home / Self Care | Payer: BLUE CROSS/BLUE SHIELD | Source: Ambulatory Visit | Attending: Interventional Radiology | Admitting: Interventional Radiology

## 2014-12-19 ENCOUNTER — Ambulatory Visit (HOSPITAL_COMMUNITY)
Admission: RE | Admit: 2014-12-19 | Discharge: 2014-12-19 | Disposition: A | Discharge status: Home / Self Care | Payer: BLUE CROSS/BLUE SHIELD | Source: Ambulatory Visit | Attending: Hematology and Oncology | Admitting: Hematology and Oncology

## 2014-12-19 ENCOUNTER — Encounter (HOSPITAL_COMMUNITY): Payer: Self-pay

## 2014-12-19 DIAGNOSIS — Z452 Encounter for adjustment and management of vascular access device: Secondary | ICD-10-CM | POA: Diagnosis present

## 2014-12-19 DIAGNOSIS — C109 Malignant neoplasm of oropharynx, unspecified: Secondary | ICD-10-CM

## 2014-12-19 DIAGNOSIS — Z85819 Personal history of malignant neoplasm of unspecified site of lip, oral cavity, and pharynx: Secondary | ICD-10-CM | POA: Diagnosis not present

## 2014-12-19 MED ORDER — SODIUM CHLORIDE 0.9 % IV SOLN
INTRAVENOUS | Status: DC
  Administered 2014-12-19: 11:00:00 via INTRAVENOUS

## 2014-12-19 MED ORDER — VANCOMYCIN HCL IN DEXTROSE 1-5 GM/200ML-% IV SOLN
1000.0000 mg | INTRAVENOUS | Status: AC
  Administered 2014-12-19: 1000 mg via INTRAVENOUS
  Filled 2014-12-19: qty 200

## 2014-12-19 MED ORDER — DIPHENHYDRAMINE HCL 50 MG/ML IJ SOLN
50.0000 mg | Freq: Once | INTRAMUSCULAR | Status: AC
  Administered 2014-12-19: 50 mg via INTRAVENOUS
  Filled 2014-12-19: qty 1

## 2014-12-19 NOTE — Progress Notes (Signed)
Enters room to check on Alaris pump beeping and pt is sitting up in bed scratching head back neck and says" this itching just started seconds ago". Vancomycin infusion has just completed pump.  Disconnected tubing and flushed with saline and reconnected to .No rash noted and no shortness of breath. Eyes are slightly swollen. Radiology notified and Dr Kathlene Cote came to see patient. Bendaryl 50 mg IV has been ordered.Stacey Carroll

## 2014-12-19 NOTE — Procedures (Signed)
Procedure:  Port removal Findings:  Right chest port, attached catheter and retention sutures removed without difficulty.  Wound irrigated and closed with 3-0 subcu Vicryl and 4-0 subcuticular Vicryl. No complications. EBL<52mL

## 2014-12-19 NOTE — Discharge Instructions (Signed)
Incision Care ° An incision is a surgical cut to open your skin. You need to take care of your incision. This helps you to not get an infection. °HOME CARE °· Only take medicine as told by your doctor. °· Do not take off your bandage (dressing) or get your incision wet until your doctor approves. Change the bandage and call your doctor if the bandage gets wet, dirty, or starts to smell. °· Take showers. Do not take baths, swim, or do anything that may soak your incision until it heals. °· Return to your normal diet and activities as told or allowed by your doctor. °· Avoid lifting any weight until your doctor approves. °· Put medicine that helps lessen itching on your incision as told by your doctor. Do not pick or scratch at your incision. °· Keep your doctor visit to have your stitches (sutures) or staples removed. °· Drink enough fluids to keep your pee (urine) clear or pale yellow. °GET HELP RIGHT AWAY IF: °· You have a fever. °· You have a rash. °· You are dizzy, or you pass out (faint) while standing. °· You have trouble breathing. °· You have a reaction or side effects to medicine given to you. °· You have redness, puffiness (swelling), or more pain in the incision and medicine does not help. °· You have fluid, blood, or yellowish-white fluid (pus) coming from the incision lasting over 1 day. °· You have muscle aches, chills, or you feel sick. °· You have a bad smell coming from the incision or bandage. °· Your incision opens up after stitches, staples, or sticky strips have been removed. °· You keep feeling sick to your stomach (nauseous) or keep throwing up (vomiting). °MAKE SURE YOU:  °· Understand these instructions. °· Will watch your condition. °· Will get help right away if you are not doing well or get worse. °Document Released: 12/15/2011 Document Reviewed: 11/16/2013 °ExitCare® Patient Information ©2015 [employer]. This information is not intended to replace advice given to you by your health  care provider. Make sure you discuss any questions you have with your health care provider. ° ° ° ° °

## 2015-01-02 ENCOUNTER — Other Ambulatory Visit: Payer: Self-pay | Admitting: Hematology and Oncology

## 2015-01-17 ENCOUNTER — Ambulatory Visit (HOSPITAL_BASED_OUTPATIENT_CLINIC_OR_DEPARTMENT_OTHER): Payer: BLUE CROSS/BLUE SHIELD | Admitting: Hematology and Oncology

## 2015-01-17 VITALS — BP 138/88 | HR 66 | Temp 97.5°F | Resp 18 | Ht 65.0 in | Wt 103.5 lb

## 2015-01-17 DIAGNOSIS — E43 Unspecified severe protein-calorie malnutrition: Secondary | ICD-10-CM | POA: Diagnosis not present

## 2015-01-17 DIAGNOSIS — C109 Malignant neoplasm of oropharynx, unspecified: Secondary | ICD-10-CM

## 2015-01-17 DIAGNOSIS — H9193 Unspecified hearing loss, bilateral: Secondary | ICD-10-CM | POA: Diagnosis not present

## 2015-01-17 DIAGNOSIS — R1310 Dysphagia, unspecified: Secondary | ICD-10-CM

## 2015-01-17 NOTE — Progress Notes (Signed)
[facility] OFFICE PROGRESS NOTE  Patient Care Team: Maurice Small, MD as PCP - General (Family Medicine) Heather [place], [place] as Consulting Physician (Optometry) Leota Sauers, RN as Oncology Nurse Navigator (Oncology) Heath Lark, MD as Consulting Physician (Hematology and Oncology) Eppie Gibson, MD as Attending Physician (Radiation Oncology) Karie Mainland, RD as Dietitian (Nutrition)  SUMMARY OF ONCOLOGIC HISTORY: Oncology History   Carcinoma of vallecula epiglottica Oropharyngeal cancer   Primary site: Pharynx - Oropharynx   Staging method: AJCC 7th Edition   Clinical: Stage IVA (T3, N2c, M0) signed by Eppie Gibson, MD on 04/20/2014  3:33 PM   Summary: Stage IVA (T3, N2c, M0)       Oropharyngeal cancer   04/14/2014 Imaging CT scan of the neck showed oropharyngeal mass.   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   04/14/2014 Surgery Laryngoscopy of the lower tongue base, vallecula, and lingual surface of the epiglottis showed a fungating irregular mass   04/26/2014 Imaging PET/CT scan showed bilateral lymphadenopathy   04/27/2014 Imaging Barium swallow shows severe dysphagia.   05/18/2014 - 05/22/2014 Hospital Admission The patient was admitted to the hospital for tracheostomy, placement of port and feeding tube.   05/24/2014 - 06/16/2014 Chemotherapy She is started on high dose cisplatin. She only received 2 doses due to intolerable side effects and patient has made an informed decision not to pursue a final dose of chemotherapy   05/24/2014 - 07/18/2014 Radiation Therapy She is started on radiation treatment    Carcinoma of vallecula epiglottica   04/20/2014 Initial Diagnosis Carcinoma of vallecula epiglottica    INTERVAL HISTORY: Please see below for problem oriented charting. She returns for further supportive care visit. Her feeding tube and port were recently removed. She is  attempting to gain weight by eating blended food. She is still able to swallow her pills. She is working with physical therapy for strengthening exercises. She is emotionally exhausted due to recent death of her father. Overall, she thinks she is slightly improving. Her neck pain is stable.  REVIEW OF SYSTEMS:   Constitutional: Denies fevers, chills or abnormal weight loss Eyes: Denies blurriness of vision Ears, nose, mouth, throat, and face: Denies mucositis or sore throat Respiratory: Denies cough, dyspnea or wheezes Cardiovascular: Denies palpitation, chest discomfort or lower extremity swelling Gastrointestinal:  Denies nausea, heartburn or change in bowel habits Skin: Denies abnormal skin rashes Lymphatics: Denies new lymphadenopathy or easy bruising Neurological:Denies numbness, tingling or new weaknesses Behavioral/Psych: Mood is stable, no new changes  All other systems were reviewed with the patient and are negative.  I have reviewed the past medical history, past surgical history, social history and family history with the patient and they are unchanged from previous note.  ALLERGIES:  is allergic to penicillins and vancomycin.  MEDICATIONS:  Current Outpatient Prescriptions  Medication Sig Dispense Refill  . diazepam (VALIUM) 10 MG tablet Take 5 mg by mouth at bedtime.     . fentaNYL (DURAGESIC - DOSED MCG/HR) 50 MCG/HR Place 1 patch (50 mcg total) onto the skin every 3 (three) days. 10 patch 0  . levothyroxine (LEVOTHROID) 50 MCG tablet Take 1 tablet (50 mcg total) by mouth daily before breakfast. 30 tablet 5  . oxyCODONE-acetaminophen (PERCOCET/ROXICET) 5-325 MG per tablet Take 2 tablets by mouth every 8 (eight) hours as needed for moderate pain or severe pain.     Marland Kitchen scopolamine (TRANSDERM-SCOP) 1 MG/3DAYS Place 1 patch (  1.5 mg total) onto the skin every 3 (three) days. 10 patch 12  . sodium fluoride (FLUORISHIELD) 1.1 % GEL dental gel Instill one drop of fluoride per tooth  space of fluoride tray. Place over teeth for 5 minutes. Remove. Spit out excess. Repeat nightly. 120 mL prn   No current facility-administered medications for this visit.    PHYSICAL EXAMINATION: ECOG PERFORMANCE STATUS: 1 - Symptomatic but completely ambulatory  Filed Vitals:   01/17/15 1110  BP: 138/88  Pulse: 66  Temp: 97.5 F (36.4 C)  Resp: 18   Filed Weights   01/17/15 1110  Weight: 103 lb 8 oz (46.947 kg)    GENERAL:alert, no distress and comfortable. She looks thin SKIN: skin color, texture, turgor are normal, no rashes or significant lesions EYES: normal, Conjunctiva are pink and non-injected, sclera clear OROPHARYNX:no exudate, no erythema and lips, buccal mucosa, and tongue normal  NECK: supple, thyroid normal size, non-tender, without nodularity LYMPH:  no palpable lymphadenopathy in the cervical, axillary or inguinal LUNGS: clear to auscultation and percussion with normal breathing effort HEART: regular rate & rhythm and no murmurs and no lower extremity edema ABDOMEN:abdomen soft, non-tender and normal bowel sounds Musculoskeletal:no cyanosis of digits and no clubbing  NEURO: alert & oriented x 3 with fluent speech, no focal motor/sensory deficits  LABORATORY DATA:  I have reviewed the data as listed    Component Value Date/Time   NA 135* 10/18/2014 1004   NA 140 05/23/2014 0430   K 3.6 10/18/2014 1004   K 4.2 05/23/2014 0430   CL 100 05/23/2014 0430   CO2 29 10/18/2014 1004   CO2 31 05/23/2014 0430   GLUCOSE 94 10/18/2014 1004   GLUCOSE 107* 05/23/2014 0430   BUN 13.7 10/18/2014 1004   BUN 13 05/23/2014 0430   CREATININE 0.8 10/18/2014 1004   CREATININE 0.62 05/23/2014 0430   CALCIUM 9.3 10/18/2014 1004   CALCIUM 9.7 05/23/2014 0430   PROT 7.4 10/18/2014 1004   PROT 5.6* 05/22/2014 0310   ALBUMIN 3.8 10/18/2014 1004   ALBUMIN 2.2* 05/22/2014 0310   AST 14 10/18/2014 1004   AST 12 05/22/2014 0310   ALT 6 10/18/2014 1004   ALT 9 05/22/2014 0310    ALKPHOS 89 10/18/2014 1004   ALKPHOS 77 05/22/2014 0310   BILITOT 0.58 10/18/2014 1004   BILITOT 0.2* 05/22/2014 0310   GFRNONAA >90 05/23/2014 0430   GFRAA >90 05/23/2014 0430    No results found for: SPEP, UPEP  Lab Results  Component Value Date   WBC 7.1 10/18/2014   NEUTROABS 5.5 10/18/2014   HGB 13.3 10/18/2014   HCT 38.9 10/18/2014   MCV 93.1 10/18/2014   PLT 256 10/18/2014      Chemistry      Component Value Date/Time   NA 135* 10/18/2014 1004   NA 140 05/23/2014 0430   K 3.6 10/18/2014 1004   K 4.2 05/23/2014 0430   CL 100 05/23/2014 0430   CO2 29 10/18/2014 1004   CO2 31 05/23/2014 0430   BUN 13.7 10/18/2014 1004   BUN 13 05/23/2014 0430   CREATININE 0.8 10/18/2014 1004   CREATININE 0.62 05/23/2014 0430      Component Value Date/Time   CALCIUM 9.3 10/18/2014 1004   CALCIUM 9.7 05/23/2014 0430   ALKPHOS 89 10/18/2014 1004   ALKPHOS 77 05/22/2014 0310   AST 14 10/18/2014 1004   AST 12 05/22/2014 0310   ALT 6 10/18/2014 1004   ALT 9 05/22/2014 0310  BILITOT 0.58 10/18/2014 1004   BILITOT 0.2* 05/22/2014 0310      ASSESSMENT & PLAN:  Oropharyngeal cancer She had recent ENT evaluation with no signs of disease recurrence. I recommend future follow-up with radiation oncologist and ENT only.   Bilateral hearing loss She has significant bilateral hearing loss. She has baseline hearing test performed recently with possibility of prescription haring aids soon.   Protein-calorie malnutrition, severe The feeding tube is removed and she has lost a little bit overweight. I recommend she increase oral fluid intake as tolerated. She is motivated.   Dysphagia She has moderate dysphagia related to mild esophageal stricture. She has a recent discussion with ENT for possibility of esophageal dilatation to help with her swallowing.    No orders of the defined types were placed in this encounter.   All questions were answered. The patient knows to call  the clinic with any problems, questions or concerns. No barriers to learning was detected. I spent 15 minutes counseling the patient face to face. The total time spent in the appointment was 20 minutes and more than 50% was on counseling and review of test results     [facility], [place], MD 01/17/2015 12:11 PM

## 2015-01-17 NOTE — Assessment & Plan Note (Signed)
She had recent ENT evaluation with no signs of disease recurrence. I recommend future follow-up with radiation oncologist and ENT only.

## 2015-01-17 NOTE — Assessment & Plan Note (Signed)
She has moderate dysphagia related to mild esophageal stricture. She has a recent discussion with ENT for possibility of esophageal dilatation to help with her swallowing.

## 2015-01-17 NOTE — Assessment & Plan Note (Signed)
She has significant bilateral hearing loss. She has baseline hearing test performed recently with possibility of prescription haring aids soon.

## 2015-01-17 NOTE — Assessment & Plan Note (Signed)
The feeding tube is removed and she has lost a little bit overweight. I recommend she increase oral fluid intake as tolerated. She is motivated.

## 2015-05-16 NOTE — Progress Notes (Addendum)
Pain Status: 6/10 in her upper back/neck area and lower back.  She is using a 50 mcg fentanyl patch and takes percocet about twice a day.  She reports getting injections in her back about 4 weeks ago.  Orthostatic Standing Vital Signs:103/70, hr 81, sitting 93/72 87  Nutritional Status a) intake: PO Diet:  b) using a feeding tube?: No-removed 12/05/14 c) weight changes, if any: down 3 lbs from 01/17/15 Wt Readings from Last 3 Encounters:  01/17/15 103 lb 8 oz (46.947 kg)  12/19/14 108 lb (48.988 kg)  11/17/14 113 lb 1.6 oz (51.302 kg)    Swallowing Status: Pt reports trouble swallowing breads and meats.  She reports she does not eat a lot.  She tries to drink 5 cans of ensure per day.  Reports she gets "choked" a lot.  Smoking or chewing tobacco? no  Dental (if applicable): When was last visit with dentistry n/a Using fluoride trays daily? Uses fluoride toothpaste.   When was last ENT visit? She thinks it was in February or March  When is next ENT visit? Will call for an appointment  Summary of last medical oncology visit (if applicable) is 90/24/09.   Next med/onc visit is on she has been released by Dr. Alvy Bimler..  Imaging done in the last month (if applicable) revealed: n/a  Other notable issues, if any: Father died within the last year. Bilateral hearing loss.  Reports having a dry mouth and drinks a lot of water and uses biotene mouth spray.  She reports a poor energy level due to her back and neck pain.  She reports her feeding tube site has been draining yellow drainage.  On inspection, the area is red with clear drainage.  BP 93/72 mmHg  Pulse 87  Temp(Src) 97.5 F (36.4 C) (Oral)  Resp 16  Ht 5\' 5"  (1.651 m)  Wt 100 lb 1.6 oz (45.405 kg)  BMI 16.66 kg/m2  SpO2 98%

## 2015-05-18 ENCOUNTER — Ambulatory Visit
Admission: RE | Admit: 2015-05-18 | Discharge: 2015-05-18 | Disposition: A | Discharge status: Home / Self Care | Payer: BLUE CROSS/BLUE SHIELD | Source: Ambulatory Visit | Attending: Radiation Oncology | Admitting: Radiation Oncology

## 2015-05-18 VITALS — BP 93/72 | HR 87 | Temp 97.5°F | Resp 16 | Ht 65.0 in | Wt 100.1 lb

## 2015-05-18 DIAGNOSIS — R21 Rash and other nonspecific skin eruption: Secondary | ICD-10-CM

## 2015-05-18 DIAGNOSIS — R634 Abnormal weight loss: Secondary | ICD-10-CM

## 2015-05-18 DIAGNOSIS — C100 Malignant neoplasm of vallecula: Secondary | ICD-10-CM

## 2015-05-18 MED ORDER — MICONAZOLE NITRATE 2 % EX CREA: TOPICAL_CREAM | CUTANEOUS | Status: DC

## 2015-05-18 NOTE — Progress Notes (Addendum)
Radiation Oncology         [phone] ________________________________  Name: Stacey Carroll MRN: [id number]  Date: 05/18/2015  DOB: 27-Jan-1961  Follow-Up Visit Note  CC: Jonathon Bellows, MD  Ruby Cola, MD  Diagnosis and Prior Radiotherapy:       ICD-9-CM ICD-10-CM   1. 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     Pain Status: 6/10 in her upper back/neck area and lower back. She is using a 50 mcg fentanyl patch and takes percocet about twice a day. She reports getting injections in her back about 4 weeks ago. This is a chronic condition - she has hardward in her spine. No new throat soreness.  Orthostatic Standing Vital Signs:103/70, hr 81, sitting 93/72 hr 87  Nutritional Status  a) intake: PO Diet:  b) using a feeding tube?: No-removed 12/05/14  c) weight changes, if any: down 3 lbs from 01/17/15  Wt Readings from Last 3 Encounters:   01/17/15  103 lb 8 oz (46.947 kg)   12/19/14  108 lb (48.988 kg)   11/17/14  113 lb 1.6 oz (51.302 kg)    Swallowing Status: Pt reports trouble swallowing breads and meats. She reports she has no appetite. She tries to drink 5 cans of ensure per day. Reports she gets "choked" a lot - not a new issue. The pt admits not doing her swallowing exercises because she does not believe that they are of any benefit.  Smoking or chewing tobacco? Occasional cigarette  Dental (if applicable): When was last visit with dentistry n/a Using fluoride trays daily? Uses fluoride toothpaste.   When was last ENT  visit? She thinks it was in February or March When is next ENT visit? Will call for an appointment   Summary of last medical oncology visit (if applicable) is 62/69/48. Next med/onc visit is on she has been released by Dr. Alvy Bimler..   Imaging done in the last month (if applicable) revealed: n/a   Other notable issues, if any: Father died within the last year. Bilateral hearing loss. Reports having a dry mouth and drinks a lot of water and uses biotene mouth spray. She reports a poor energy level due to her back and neck pain. Pt reports that she feels depressed and has not been seeing her therapist. The pt complains of the location of where her PEG tube was located "oozing." She reports putting her fentanyl patch over it. The pt went back to smoking, attributing it to stress.                      ALLERGIES:  is allergic to penicillins and vancomycin.  Meds: Current Outpatient Prescriptions  Medication Sig Dispense Refill  . antiseptic oral rinse (BIOTENE) LIQD 15 mLs by Mouth Rinse route as needed for dry mouth.    Marland Kitchen buPROPion (WELLBUTRIN SR) 150 MG 12 hr tablet Take 150 mg by mouth daily.    . diazepam (VALIUM) 10 MG tablet Take 5 mg by mouth at bedtime. Can take 1 tablet bid prn    .  fentaNYL (DURAGESIC - DOSED MCG/HR) 50 MCG/HR Place 1 patch (50 mcg total) onto the skin every 3 (three) days. 10 patch 0  . fexofenadine (ALLEGRA) 180 MG tablet Take 180 mg by mouth daily.    Marland Kitchen levothyroxine (LEVOTHROID) 50 MCG tablet Take 1 tablet (50 mcg total) by mouth daily before breakfast. (Patient taking differently: Take 75 mcg by mouth daily before breakfast. ) 30 tablet 5  . oxyCODONE-acetaminophen (PERCOCET/ROXICET) 5-325 MG per tablet Take 2 tablets by mouth every 8 (eight) hours as needed for moderate pain or severe pain.     Marland Kitchen PARoxetine (PAXIL) 40 MG tablet Take 40 mg by mouth every morning.    . traZODone (DESYREL) 50 MG tablet Take 50 mg by mouth at bedtime. Take 1-2 tablets at bedtime    .  scopolamine (TRANSDERM-SCOP) 1 MG/3DAYS Place 1 patch (1.5 mg total) onto the skin every 3 (three) days. (Patient not taking: Reported on 05/18/2015) 10 patch 12  . sodium fluoride (FLUORISHIELD) 1.1 % GEL dental gel Instill one drop of fluoride per tooth space of fluoride tray. Place over teeth for 5 minutes. Remove. Spit out excess. Repeat nightly. (Patient not taking: Reported on 05/18/2015) 120 mL prn   No current facility-administered medications for this encounter.    Physical Findings: The patient is in   distress. Patient is alert and oriented.  height is 5\' 5"  (1.651 m) and weight is 100 lb 1.6 oz (45.405 kg). Her oral temperature is 97.5 F (36.4 C). Her blood pressure is 93/72 and her pulse is 87. Her respiration is 16 and oxygen saturation is 98%.  General: Alert and oriented, in no acute distress HEENT: Head is normocephalic. Extraocular movements are intact. Oropharynx has no thrush. Frothy sputum.  Neck: Neck is supple, no palpable cervical or supraclavicular lymphadenopathy. Heart: Regular in rate and rhythm with no murmurs, rubs, or gallops. Chest: Clear to auscultation bilaterally, with no rhonchi, wheezes, or rales. Coarse breath sounds bilaterally Abdomen: Soft, nontender, nondistended, with no rigidity or guarding. In her abdominal wall, the prior stoma from her PEG site demonstrates some moistness over the skin, but no obvious patency. The skin has an erythematous rash that appears yeast like. Extremities: No cyanosis or edema. Lymphatics: see Neck Exam Skin: No concerning lesions. Musculoskeletal: symmetric strength and muscle tone throughout. Neurologic: Cranial nerves II through XII are grossly intact. No obvious focalities. Speech is fluent. Coordination is intact. Psychiatric: Judgment and insight are intact. Affect is appropriate.   Lab Findings: Lab Results  Component Value Date   WBC 7.1 10/18/2014   HGB 13.3 10/18/2014   HCT 38.9 10/18/2014   MCV 93.1  10/18/2014   PLT 256 10/18/2014    CMP     Component Value Date/Time   NA 135* 10/18/2014 1004   NA 140 05/23/2014 0430   K 3.6 10/18/2014 1004   K 4.2 05/23/2014 0430   CL 100 05/23/2014 0430   CO2 29 10/18/2014 1004   CO2 31 05/23/2014 0430   GLUCOSE 94 10/18/2014 1004   GLUCOSE 107* 05/23/2014 0430   BUN 13.7 10/18/2014 1004   BUN 13 05/23/2014 0430   CREATININE 0.8 10/18/2014 1004   CREATININE 0.62 05/23/2014 0430   CALCIUM 9.3 10/18/2014 1004   CALCIUM 9.7 05/23/2014 0430   PROT 7.4 10/18/2014 1004   PROT 5.6* 05/22/2014 0310   ALBUMIN 3.8 10/18/2014 1004   ALBUMIN 2.2* 05/22/2014 0310   AST 14 10/18/2014 1004   AST 12 05/22/2014 0310   ALT  6 10/18/2014 1004   ALT 9 05/22/2014 0310   ALKPHOS 89 10/18/2014 1004   ALKPHOS 77 05/22/2014 0310   BILITOT 0.58 10/18/2014 1004   BILITOT 0.2* 05/22/2014 0310   GFRNONAA >90 05/23/2014 0430   GFRAA >90 05/23/2014 0430    Lab Results  Component Value Date   TSH 106.407* 11/14/2014    Impression/Plan:    1) Head and Neck Cancer Status: concerning weight loss and multiple issues:  2) Nutritional Status: refer to [facility] [place] - weight: down 3 lbs from April. - PEG tube: Removed  3) Risk Factors: The patient has been educated about risk factors including alcohol and tobacco abuse; she understands that avoidance of alcohol and tobacco is important to prevent recurrences as well as other cancers. The patient continues to use tobacco. The patient was counseled to stop using tobacco and was offered pharmacotherapy and further counseling to help with this. The patient declined pharmacotherapy and further counseling at this time.  4) Swallowing: Difficulty swallowing. Declines SLP at present.  5) Dental: Encouraged to continue regular followup with dentistry, and dental hygiene including fluoride rinses.  6) Thyroid function: Dr. Justin Mend increased the pt's Levothyroxine from 44mcg to 34mcg. Dr. Justin Mend is managing her thyroid levels  and medication.  7) Social: Father passed within the last year. Pt reports that she feels depressed. Her depression is being managed by her PCP and she has not been seeing her therapist recently. Pt went back to smoking and attributes this to stress with me advising her to quit smoking. Refer to social work.  7a) has chronic pain, weakness, deconditioning. Refer to PT  8) overall plan: I will refer the pt back to nutrition, physical therapy, and social work. I will order a CT scan to be performed in 1 week. I advised the pt to apply gauze where the PEG tube was located and not to put her fentanyl patch there. Rx for miconazole cream for skin rash over abdomen.  Gayleen Orem, RN, our Head and Neck Oncology Navigator will have the pt refered her back to ENT for laryngoscopy.   Ordering labs   9) Follow-up in 1 week after the CT scan. The patient was encouraged to call with any issues or questions before then.   This document serves as a record of services personally performed by Eppie Gibson, MD. It was created on her behalf by Darcus Austin, a trained medical scribe. The creation of this record is based on the scribe's personal observations and the provider's statements to them. This document has been checked and approved by the attending provider.       Eppie Gibson, MD

## 2015-05-21 ENCOUNTER — Encounter: Payer: Self-pay | Admitting: Nutrition

## 2015-05-21 ENCOUNTER — Telehealth: Payer: Self-pay | Admitting: *Deleted

## 2015-05-21 ENCOUNTER — Telehealth: Payer: Self-pay | Admitting: Radiation Oncology

## 2015-05-21 NOTE — Telephone Encounter (Signed)
Phoned patient making her aware a prescription cream to apply to the rash on her abdomen has been sent to [facility]. Patient verbalized understanding and expressed appreciation for the call.

## 2015-05-21 NOTE — Telephone Encounter (Signed)
CALLED PATIENT TO INFORM OF PT APPT., NUTRITION APPT. AND CT APPTS. AND FU APPT., SPOKE WITH PATIENT AND SHE IS AWARE OF THESE APPTS.

## 2015-05-21 NOTE — Telephone Encounter (Signed)
-----   Message from Eppie Gibson, MD sent at 05/18/2015  5:55 PM EDT ----- All, Please let pt know I called in Rx for cream to apply to rash over abdomen. Can pick it up at :   [id number] - [place], [place]

## 2015-05-21 NOTE — Telephone Encounter (Signed)
  Oncology Nurse Navigator Documentation   Navigator Encounter Type: Telephone (05/21/15 1004)      Per Dr. Pearlie Oyster guidance, called [facility].  Spoke with  Michel Bickers, requested that patient be contacted and appt be arranged to see Dr. Simeon Craft in 3 months.  She verbalized understanding.  Gayleen Orem, RN, BSN, [facility] [place] [phone]

## 2015-05-23 ENCOUNTER — Ambulatory Visit
Admission: RE | Admit: 2015-05-23 | Discharge: 2015-05-23 | Disposition: A | Discharge status: Home / Self Care | Payer: BLUE CROSS/BLUE SHIELD | Source: Ambulatory Visit | Attending: Radiation Oncology | Admitting: Radiation Oncology

## 2015-05-23 ENCOUNTER — Ambulatory Visit: Payer: BLUE CROSS/BLUE SHIELD | Admitting: Physical Therapy

## 2015-05-23 ENCOUNTER — Encounter: Payer: Self-pay | Admitting: *Deleted

## 2015-05-23 ENCOUNTER — Ambulatory Visit: Payer: BLUE CROSS/BLUE SHIELD | Attending: Radiation Oncology | Admitting: Physical Therapy

## 2015-05-23 ENCOUNTER — Ambulatory Visit: Payer: BLUE CROSS/BLUE SHIELD | Admitting: Nutrition

## 2015-05-23 DIAGNOSIS — G894 Chronic pain syndrome: Secondary | ICD-10-CM

## 2015-05-23 DIAGNOSIS — C109 Malignant neoplasm of oropharynx, unspecified: Secondary | ICD-10-CM

## 2015-05-23 DIAGNOSIS — E038 Other specified hypothyroidism: Secondary | ICD-10-CM

## 2015-05-23 DIAGNOSIS — C100 Malignant neoplasm of vallecula: Secondary | ICD-10-CM | POA: Diagnosis not present

## 2015-05-23 DIAGNOSIS — R5381 Other malaise: Secondary | ICD-10-CM | POA: Insufficient documentation

## 2015-05-23 DIAGNOSIS — R634 Abnormal weight loss: Secondary | ICD-10-CM

## 2015-05-23 DIAGNOSIS — Z9181 History of falling: Secondary | ICD-10-CM

## 2015-05-23 LAB — BASIC METABOLIC PANEL (CC13)
Anion Gap: 10 mEq/L (ref 3–11)
BUN: 20.4 mg/dL (ref 7.0–26.0)
CALCIUM: 9.6 mg/dL (ref 8.4–10.4)
CHLORIDE: 101 meq/L (ref 98–109)
CO2: 30 mEq/L — ABNORMAL HIGH (ref 22–29)
Creatinine: 0.8 mg/dL (ref 0.6–1.1)
EGFR: 78 mL/min/{1.73_m2} — AB (ref >90)
Glucose: 83 mg/dl (ref 70–140)
Potassium: 4.2 mEq/L (ref 3.5–5.1)
SODIUM: 141 meq/L (ref 136–145)

## 2015-05-23 LAB — CBC WITH DIFFERENTIAL/PLATELET
BASO%: 0.8 % (ref 0.0–2.0)
Basophils Absolute: 0 10*3/uL (ref 0.0–0.1)
EOS%: 2.1 % (ref 0.0–7.0)
Eosinophils Absolute: 0.1 10*3/uL (ref 0.0–0.5)
HCT: 39.6 % (ref 34.8–46.6)
HEMOGLOBIN: 13.3 g/dL (ref 11.6–15.9)
LYMPH%: 19.4 % (ref 14.0–49.7)
MCH: 31.2 pg (ref 25.1–34.0)
MCHC: 33.5 g/dL (ref 31.5–36.0)
MCV: 93.3 fL (ref 79.5–101.0)
MONO#: 0.7 10*3/uL (ref 0.1–0.9)
MONO%: 12.3 % (ref 0.0–14.0)
NEUT#: 3.5 10*3/uL (ref 1.5–6.5)
NEUT%: 65.4 % (ref 38.4–76.8)
Platelets: 253 10*3/uL (ref 145–400)
RBC: 4.25 10*6/uL (ref 3.70–5.45)
RDW: 15.2 % — AB (ref 11.2–14.5)
WBC: 5.3 10*3/uL (ref 3.9–10.3)
lymph#: 1 10*3/uL (ref 0.9–3.3)

## 2015-05-23 NOTE — Therapy (Addendum)
[place], [place], [address] Phone: [phone]   Fax:  [phone]  Physical Therapy Evaluation  Patient Details  Name: Stacey Carroll MRN: [id number] Date of Birth: Nov 05, 1960 Referring Provider:  Eppie Gibson, MD  Encounter Date: 05/23/2015      PT End of Session - 05/23/15 1243    Visit Number 1   Number of Visits 17   Date for PT Re-Evaluation 07/22/15   PT Start Time 0935   PT Stop Time 1020   PT Time Calculation (min) 45 min   Activity Tolerance Patient limited by fatigue   Behavior During Therapy [facility] for tasks assessed/performed  became tearful during eval talking about deaths in her family (both parents in the last two years)      Past Medical History  Diagnosis Date  . Visual disturbance 03/11/2013  . Dyslipidemia   . Headache(784.0)   . Depression   . Endometriosis   . Hypercholesterolemia   . Anxiety   . Scoliosis   . Arthritis   . Throat pain 04/19/2014  . Dysphagia 04/27/2014  . Cancer   . PONV (postoperative nausea and vomiting)   . Hypertension     not taking meds  . Asthma   . Cough   . Nausea alone 05/31/2014  . S/P radiation therapy 05/24/2014-07/18/2014    7000  cGy/ Vallecula/neck    Past Surgical History  Procedure Laterality Date  . Cervical laminectomy  2007 (?), 2011  . Shoulder surgery Left   . Abdominal hysterectomy  1987  . Laryngoscopy and esophagoscopy N/A 04/14/2014    Procedure: DIRECT LARYNGOSCOPY WITH BIOPSY  AND ESOPHAGOSCOPY;  Surgeon: Ruby Cola, MD;  Location: [place];  Service: ENT;  Laterality: N/A;  . Portacath placement N/A 05/18/2014    Procedure: INSERTION PORT-A-CATH;  Surgeon: Rolm Bookbinder, MD;  Location: [place];  Service: General;  Laterality: N/A;  . Gastrostomy N/A 05/18/2014    Procedure: GASTROSTOMY/PLACEMENT OF G-TUBE;  Surgeon: Rolm Bookbinder, MD;  Location: [place];  Service: General;  Laterality: N/A;  . Tracheostomy tube  placement N/A 05/18/2014    Procedure: TRACHEOSTOMY;  Surgeon: Melissa Montane, MD;  Location: [place];  Service: ENT;  Laterality: N/A;    There were no vitals filed for this visit.  Visit Diagnosis:  Physical deconditioning - Plan: PT plan of care cert/re-cert  Chronic pain syndrome - Plan: PT plan of care cert/re-cert  Risk for falls - Plan: PT plan of care cert/re-cert  Personal history of fall - Plan: PT plan of care cert/re-cert      Subjective Assessment - 05/23/15 0946    Subjective Neck and back pain; weakness from 60 lb. weight loss related to treatment.   Pertinent History Diagnosed with throat cancer 04/10/14; treated with chemoradiation, which was completed in October.  Reports she was supposed to have back and neck surgery before she got the diagnosis of cancer last July. She has lost 60 pounds since prior to diagnosis and is weak. She has had 2 surgeries on her neck and one on her back She has plates and screws in her neck, but thinks the screws are coming out. She has three herniated discs in low back for which she has been receiving injections, and those haven't helped. Her feeding tube was removed. Left shoulder hemiarthroplasty with limited shoulder range of motion.  How long can you sit comfortably? sits on a pillow but it's uncomfortable due to her thin frame   How long can you stand comfortably? not long enough to do dishes; maybe  minutes.   Patient Stated Goals Get to where I can walk better without falling and get stronger so I can walk around and get up and do things; wants to get stronger to be able to do back and neck surgery.   Currently in Pain? Yes   Pain Score 6    Pain Location Back   Pain Orientation Lower   Pain Descriptors / Indicators Stabbing;Burning   Pain Type Chronic pain   Aggravating Factors  being up on her feet   Pain Relieving Factors changing positions in in sitting;  pain medicine, including pain patch; standing up to stretch   Effect of Pain on Daily Activities can't do dishes; sleeps in a recliner            [facility] PT Assessment - 05/23/15 0001    Assessment   Medical Diagnosis s/p chemoradiation for throat cancer   Precautions   Precautions Fall;Other (comment)   Precaution Comments 10 lb. lifting restriction with left arm; HOH, cancer precautions   Balance Screen   Has the patient fallen in the past 6 months Yes   How many times? 2  blacked out at back deck; fell getting out of shower   Has the patient had a decrease in activity level because of a fear of falling?  Yes   Is the patient reluctant to leave their home because of a fear of falling?  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   Comments can't focus well enough to drive   Cognition   Overall Cognitive Status Within Functional Limits for tasks assessed   Observation/Other Assessments   Observations very thin, frail woman who moves tentatively and has apparently poor balance   Quick DASH  59.09   ROM / Strength   AROM / PROM / Strength AROM;Strength   AROM   Overall AROM Comments LEs grossly WFL except left knee extension less than full against gravity   AROM Assessment Site Shoulder;Cervical;Lumbar   Right/Left Shoulder Right;Left   Right Shoulder Flexion 145 Degrees   Right Shoulder ABduction 138 Degrees   Left Shoulder Flexion 108 Degrees  note: left shoulder hemiarthroplasty   Left Shoulder ABduction 87 Degrees   Cervical Flexion 10% loss   Cervical Extension 75% loss   Cervical - Right Side Bend 25% loss   Cervical - Left Side Bend 25% loss   Cervical - Right Rotation 15% loss   Cervical -  Left Rotation 25% loss   Strength   Overall Strength Comments Right LE grossly 4+/5; left grossly 4/5 with some c/o left hip pain   Ambulation/Gait   Ambulation/Gait Yes   Ambulation/Gait Assistance 6: Modified independent (Device/Increase time)  walks slowly, tentatively, unsteadily   Standardized Balance Assessment   Standardized Balance Assessment Timed Up and Go Test   Timed Up and Go Test   Normal TUG (seconds) 20              Quick Dash - 05/23/15 0001  Open a tight or new jar Unable   Do heavy household chores (wash walls, wash floors) Severe difficulty   Carry a shopping bag or briefcase Moderate difficulty   Wash your back Moderate difficulty   Use a knife to cut food Mild difficulty   Recreational activities in which you take some force or impact through your arm, shoulder, or hand (golf, hammering, tennis) Moderate difficulty   During the past week, to what extent has your arm, shoulder or hand problem interfered with your normal social activities with family, friends, neighbors, or groups? Quite a bit   During the past week, to what extent has your arm, shoulder or hand problem limited your work or other regular daily activities Modererately   Arm, shoulder, or hand pain. Moderate   Tingling (pins and needles) in your arm, shoulder, or hand Severe   Difficulty Sleeping Moderate difficulty   DASH Score 59.09 %                        Short Term Clinic Goals - 05/23/15 1252    CC Short Term Goal  #1   Title pt will be independent in a basic home exercise program.   Time 4   Period Weeks   Status New   CC Short Term Goal  #2   Title pt will be able to stay outside in her garden for one half hour at time before fatiguing   Time 4   Period [facility] Goals - 05/23/15 1253    CC Long Term Goal  #1   Title pt will be able to be ready to transition to a communtiy exercise program ([id number]) so she  can continue to get stronger on her own.,   Time 8   Period Weeks   Status New   CC Long Term Goal  #2   Title Reduce TUG score to 13 seconds or less, indicating decreased risk for falls.   Time 8   Period Weeks   Status New   CC Long Term Goal  #3   Title Patient will report at least 50% improved endurance and tolerance for everyday activities like washing dishes.   Time 8   Period Weeks   Status New   CC Long Term Goal  #4   Title Patient will be able to walk for 15 minutes without needing to stop to sit down.   Time 8   Period Weeks   Status New            Plan - 05/23/15 1245    Clinical Impression Statement 54 year-old woman status post chemoradiation for throat cancer who has lost 60 lbs. and has lost strength with it.  She is unsteady when she walks and has fallen twice recently, perhaps due to having some lightheadedness when she stands from sitting.  She has mild weakness left > right leg, unsteadiness with standing and walking, and limited endurance, reporting fatigue after doing just AROM and manual muscle test during eval.  She has concerns about continued weight loss even months after treatment was completed, and she has a CT scan tomorrow to check for any residual disease, about which she is quite worried.  She was tearful during her session today when talking about losing both her parents in the past two years.  She was seen here twice about six  months ago but postponed further treatment at that time because her father was ill.    Pt will benefit from skilled therapeutic intervention in order to improve on the following deficits Decreased activity tolerance;Decreased endurance;Decreased balance;Decreased strength;Difficulty walking   Rehab Potential Good   Clinical Impairments Affecting Rehab Potential Continuing weight loss   PT Frequency 2x / week   PT Duration 8 weeks   PT Treatment/Interventions Therapeutic exercise;Patient/family education;ADLs/Self Care Home  Management;Balance training   PT Next Visit Plan Attempt two minute walk test.  Begin endurance training, strengthening exercise, and balance training.  Begin HEP instruction.   Consulted and Agree with Plan of Care Patient         Problem List Patient Active Problem List   Diagnosis Date Noted  . Bilateral hearing loss 01/17/2015  . Cough 06/26/2014  . Preventive measure 06/14/2014  . Anemia in neoplastic disease 06/14/2014  . Nausea without vomiting 05/31/2014  . S/P percutaneous endoscopic gastrostomy (PEG) tube placement 05/24/2014  . Tracheostomy status 05/24/2014  . Protein-calorie malnutrition, severe 05/19/2014  . Dysphagia 04/27/2014  . 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 Throat pain 04/19/2014  . 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 DJD (degenerative joint disease) of cervical spine 04/19/2014    Aldean Suddeth 05/23/2015, 10:27 PM  [place], [place], [address] Phone: [phone]   Fax:  [place], PT 05/23/2015 10:27 PM

## 2015-05-23 NOTE — Progress Notes (Signed)
Pain Status: Denies any pain upon swallowing: C/o low back pain as a level 8/10.   Using 50 mcg fentanyl patch and takes percocet bid.   Vital Signs: Orthostatic Standing Vital Signs: BP: P: Pox: BP 99/63 mmHg  Pulse 68  Temp(Src) 97.7 F (36.5 C)  Ht 5\' 5"  (1.651 m)  Wt 98 lb 8 oz (44.679 kg)  BMI 16.39 kg/m2 Sitting BP 99/68 mmHg  Pulse 73  Temp(Src) 97.7 F (36.5 C)  Ht 5\' 5"  (1.651 m)  Wt 98 lb 8 oz (44.679 kg)  BMI 16.39 kg/m2- Standing  Nutritional Status a) intake: PO Diet: Soft foods and Ensure 4-5 times daily b) using a feeding tube?: no- removed 12/05/14 c) weight changes, if any   BP 99/63 mmHg  Pulse 68  Temp(Src) 97.7 F (36.5 C)  Ht 5\' 5"  (1.651 m)  Wt 98 lb 8 oz (44.679 kg)  BMI 16.39 kg/m2 - sitting  Wt Readings from Last 3 Encounters:  05/25/15 98 lb 8 oz (44.679 kg)  05/23/15 97 lb 12.8 oz (44.362 kg)  05/18/15 100 lb 1.6 oz (45.405 kg)    Swallowing Status: Pt has had dysphagia for solids-breads and meats. Drinking 5 cans of Ensure daily  Smoking or chewing tobacco? no  Dental (if applicable): When was last visit with dentistry-NA  Using fluoride trays daily? NO, but encouraged use Using fluoride toothpaste: Yes   When was last ENT visit? Feb/March 2016  When is next ENT visit? August 2016- No appt. Date at this time  Summary of last medical oncology visit (if applicable) is 43/56/86 with report of Dyshagia Protein Calorie Nutrition and Bilateral hearing loss.   Next med/onc visit released by Dr. Alvy Bimler in April 2016.  Imaging done in the last month (if applicable) revealed: N/A  Other notable issues, if any: Father died within the last year. Bilateral hearing loss.  Reports having a dry mouth and drinks a lot of water and uses biotene mouth spray.  She reports a poor energy level due to her back and neck pain.  She reports her feeding tube site has been draining yellow drainage.  On inspection, the area is red with clear drainage

## 2015-05-23 NOTE — Progress Notes (Signed)
[place] [facility]  Clinical Social Work was referred by Pension scheme manager for assessment of psychosocial needs.  Patient reported "feeling depressed" and not currently seeing therapist during radiation oncology visit.  Clinical Social Worker met with patient after dietitian to offer support and assess for needs.  Stacey Carroll was accompanied by her son.  She shared she was seeing Eino Farber, PA-C at [facility], but was not seeing a therapist.  She's currently receiving anti depressants prescribed by her primary care physician.  Stacey Carroll reported she has been experiencing memory loss, feels "down", and gets tearful very easily.  The patient shared recent losses/challenges including the passing of her mother and father and dealing with her cancer diagnosis.  CSW strongly reccomended patient re-establish care with [facility] and develop a relationship with a therapist to begin to process her emotions and develop positive coping skills.  Patient's son was eager to complete appointment today, CSW will follow up with patient by phone.   Polo Riley, MSW, LCSW, OSW-C Clinical Social Worker [facility] [phone]

## 2015-05-23 NOTE — Progress Notes (Signed)
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  Patient referred to nutrition secondary to progressive weight loss. Patient had her feeding tube removed in March 2016. Weight documented at 113.1 pounds November 17, 2014. Weight today 97.8 pounds. Labs pending.  Estimated nutrition needs:  1800 - 2000 calories, 60-75 grams protein.  Patient reports poor appetite and taste alterations. She is drinking 4-5 cans of a generic oral nutrition supplement which has 350 calories each. Patient's dietary recall reveals patient is consuming cereal and milk, 4 puddings, 4 Plus supplements and several ice cream sandwiches daily.  She also tolerates biscuits and gravy and pot pie. Patient appears to consume average of 2000 calories and 75 grams protein daily based on dietary recall.  Nutrition diagnosis: Unintended weight loss continues.  Intervention:  Educated patient to continue oral nutrition supplements 4-5 cans daily.   Reviewed moist, easy to swallow protein foods that are high in calories for patient to expand her oral intake. Provided multiple fact sheets on ways to add calories and protein and a list of high-calorie, high-protein foods. Provided samples of Ensure Plus and additional coupons if patient can't afford to purchase.  It is fine for patient to continue generic oral nutrition supplements. Questions were answered.  Teach back method used.  Monitoring, evaluation, goals: Patient will continue increased calories and protein to promote weight gain.  Next visit: To be scheduled as needed. Patient will work to incorporate additional foods based on textures and taste.  **Disclaimer: This note was dictated with voice recognition software. Similar sounding words can inadvertently be transcribed and this note may contain transcription errors which may not have been corrected upon publication of note.**

## 2015-05-24 ENCOUNTER — Encounter (HOSPITAL_COMMUNITY): Payer: Self-pay

## 2015-05-24 ENCOUNTER — Ambulatory Visit (HOSPITAL_COMMUNITY): Payer: BLUE CROSS/BLUE SHIELD

## 2015-05-24 ENCOUNTER — Ambulatory Visit (HOSPITAL_COMMUNITY)
Admission: RE | Admit: 2015-05-24 | Discharge: 2015-05-24 | Disposition: A | Discharge status: Home / Self Care | Payer: BLUE CROSS/BLUE SHIELD | Source: Ambulatory Visit | Attending: Radiation Oncology | Admitting: Radiation Oncology

## 2015-05-24 DIAGNOSIS — I700 Atherosclerosis of aorta: Secondary | ICD-10-CM | POA: Diagnosis not present

## 2015-05-24 DIAGNOSIS — I6529 Occlusion and stenosis of unspecified carotid artery: Secondary | ICD-10-CM | POA: Insufficient documentation

## 2015-05-24 DIAGNOSIS — C100 Malignant neoplasm of vallecula: Secondary | ICD-10-CM | POA: Diagnosis present

## 2015-05-24 DIAGNOSIS — K838 Other specified diseases of biliary tract: Secondary | ICD-10-CM | POA: Insufficient documentation

## 2015-05-24 LAB — TSH CHCC: TSH: 6.341 m[IU]/L — AB (ref 0.308–3.960)

## 2015-05-24 MED ORDER — IOHEXOL 300 MG/ML  SOLN
75.0000 mL | Freq: Once | INTRAMUSCULAR | Status: AC | PRN
  Administered 2015-05-24: 75 mL via INTRAVENOUS

## 2015-05-25 ENCOUNTER — Ambulatory Visit (HOSPITAL_BASED_OUTPATIENT_CLINIC_OR_DEPARTMENT_OTHER)
Admission: RE | Admit: 2015-05-25 | Discharge: 2015-05-25 | Disposition: A | Discharge status: Home / Self Care | Payer: BLUE CROSS/BLUE SHIELD | Source: Ambulatory Visit | Attending: Radiation Oncology | Admitting: Radiation Oncology

## 2015-05-25 ENCOUNTER — Encounter: Payer: Self-pay | Admitting: *Deleted

## 2015-05-25 ENCOUNTER — Ambulatory Visit
Admission: RE | Admit: 2015-05-25 | Discharge: 2015-05-25 | Disposition: A | Discharge status: Home / Self Care | Payer: BLUE CROSS/BLUE SHIELD | Source: Ambulatory Visit | Attending: Radiation Oncology | Admitting: Radiation Oncology

## 2015-05-25 ENCOUNTER — Encounter: Payer: Self-pay | Admitting: Radiation Oncology

## 2015-05-25 VITALS — BP 99/68 | HR 73 | Temp 97.7°F | Ht 65.0 in | Wt 98.5 lb

## 2015-05-25 DIAGNOSIS — E038 Other specified hypothyroidism: Secondary | ICD-10-CM

## 2015-05-25 DIAGNOSIS — C109 Malignant neoplasm of oropharynx, unspecified: Secondary | ICD-10-CM

## 2015-05-25 DIAGNOSIS — C100 Malignant neoplasm of vallecula: Secondary | ICD-10-CM

## 2015-05-25 DIAGNOSIS — R1010 Upper abdominal pain, unspecified: Secondary | ICD-10-CM

## 2015-05-25 LAB — COMPREHENSIVE METABOLIC PANEL (CC13)
ALT: 23 U/L (ref 0–55)
AST: 21 U/L (ref 5–34)
Albumin: 3.8 g/dL (ref 3.5–5.0)
Alkaline Phosphatase: 102 U/L (ref 40–150)
Anion Gap: 8 mEq/L (ref 3–11)
BUN: 18.6 mg/dL (ref 7.0–26.0)
CHLORIDE: 102 meq/L (ref 98–109)
CO2: 31 mEq/L — ABNORMAL HIGH (ref 22–29)
Calcium: 9.8 mg/dL (ref 8.4–10.4)
Creatinine: 0.9 mg/dL (ref 0.6–1.1)
EGFR: 75 mL/min/{1.73_m2} — ABNORMAL LOW (ref >90)
GLUCOSE: 70 mg/dL (ref 70–140)
Potassium: 5 mEq/L (ref 3.5–5.1)
Sodium: 140 mEq/L (ref 136–145)
Total Bilirubin: <0.2 mg/dL (ref 0.20–1.20)
Total Protein: 6.9 g/dL (ref 6.4–8.3)

## 2015-05-25 NOTE — Progress Notes (Signed)
Radiation Oncology         [phone] ________________________________  Name: Stacey Carroll MRN: [id number]  Date: 05/25/2015  DOB: 05/18/1961  Follow-Up Visit Note  CC: Jonathon Bellows, MD  Ruby Cola, MD  Diagnosis and Prior Radiotherapy:       ICD-9-CM ICD-10-CM   1. 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 Oropharyngeal cancer 146.9 C10.9   3. 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   Earlier this week she had a CT scan, saw nutrition, social work, and physical therapy as recommended by me. CT showed NED. Her labs are relatively unremarkable. Her TSH has improved from 106 to 6.3 over the past 6 months. As noted earlier, she recently had her levothyroxine increased to 75 mcg by her PCP. The pt had a reaction when she ingested the barium during the CT scan. The pt reports that it made her itch and develop a body rash.  She has lost 2 lb in 1 week.     ALLERGIES:  is allergic to penicillins; vancomycin; and barium sulfate.  Meds: Current Outpatient Prescriptions  Medication Sig Dispense Refill  . antiseptic oral rinse (BIOTENE) LIQD 15 mLs by Mouth Rinse route as needed for dry mouth.    Marland Kitchen buPROPion (WELLBUTRIN SR) 150 MG 12 hr tablet Take 150 mg by mouth daily.    . diazepam (VALIUM) 10 MG tablet Take 5 mg by mouth at bedtime. Can take 1 tablet bid prn    . fentaNYL (DURAGESIC - DOSED MCG/HR) 50 MCG/HR Place 1 patch (50 mcg total) onto the skin every 3 (three) days. 10 patch 0  . fexofenadine (ALLEGRA) 180 MG tablet  Take 180 mg by mouth daily.    Marland Kitchen levothyroxine (LEVOTHROID) 50 MCG tablet Take 1 tablet (50 mcg total) by mouth daily before breakfast. (Patient taking differently: Take 75 mcg by mouth daily before breakfast. ) 30 tablet 5  . miconazole (MICATIN) 2 % cream Apply TID to rash over abdomen for 2 weeks. 28 g 2  . oxyCODONE-acetaminophen (PERCOCET/ROXICET) 5-325 MG per tablet Take 2 tablets by mouth every 8 (eight) hours as needed for moderate pain or severe pain.     Marland Kitchen PARoxetine (PAXIL) 40 MG tablet Take 40 mg by mouth every morning.    . sodium fluoride (FLUORISHIELD) 1.1 % GEL dental gel Instill one drop of fluoride per tooth space of fluoride tray. Place over teeth for 5 minutes. Remove. Spit out excess. Repeat nightly. 120 mL prn  . traZODone (DESYREL) 50 MG tablet Take 50 mg by mouth at bedtime. Take 1-2 tablets at bedtime     No current facility-administered medications for this encounter.    Physical Findings: The patient is in no acute distress. Patient is alert and oriented.  height is 5\' 5"  (1.651 m) and weight is 98 lb 8 oz (44.679 kg). Her temperature is 97.7 F (36.5 C). Her blood pressure is 99/68 and her pulse is 73.  General: Alert and oriented, in no acute distress Skin: Abdominal rash has largely improved. Psychiatric: Judgment and insight are  intact. Affect is blunted.  Lab Findings: Lab Results  Component Value Date   WBC 5.3 05/23/2015   HGB 13.3 05/23/2015   HCT 39.6 05/23/2015   MCV 93.3 05/23/2015   PLT 253 05/23/2015    CMP     Component Value Date/Time   NA 141 05/23/2015 1522   NA 140 05/23/2014 0430   K 4.2 05/23/2015 1522   K 4.2 05/23/2014 0430   CL 100 05/23/2014 0430   CO2 30* 05/23/2015 1522   CO2 31 05/23/2014 0430   GLUCOSE 83 05/23/2015 1522   GLUCOSE 107* 05/23/2014 0430   BUN 20.4 05/23/2015 1522   BUN 13 05/23/2014 0430   CREATININE 0.8 05/23/2015 1522   CREATININE 0.62 05/23/2014 0430   CALCIUM 9.6 05/23/2015 1522   CALCIUM 9.7  05/23/2014 0430   PROT 7.4 10/18/2014 1004   PROT 5.6* 05/22/2014 0310   ALBUMIN 3.8 10/18/2014 1004   ALBUMIN 2.2* 05/22/2014 0310   AST 14 10/18/2014 1004   AST 12 05/22/2014 0310   ALT 6 10/18/2014 1004   ALT 9 05/22/2014 0310   ALKPHOS 89 10/18/2014 1004   ALKPHOS 77 05/22/2014 0310   BILITOT 0.58 10/18/2014 1004   BILITOT 0.2* 05/22/2014 0310   GFRNONAA >90 05/23/2014 0430   GFRAA >90 05/23/2014 0430    Lab Results  Component Value Date   TSH 6.341* 05/23/2015   Ct Soft Tissue Neck W Contrast  05/24/2015   CLINICAL DATA:  54 year old female with squamous cell carcinoma of the vallecula status post chemotherapy and radiation. Radiation completed October 2015. Weight loss. Subsequent encounter.  EXAM: CT NECK WITH CONTRAST  TECHNIQUE: Multidetector CT imaging of the neck was performed using the standard protocol following the bolus administration of intravenous contrast.  CONTRAST:  53mL OMNIPAQUE IOHEXOL 300 MG/ML SOLN in conjunction with contrast enhanced imaging of the chest and abdomen reported separately.  COMPARISON:  PET-CT 11/14/2014.  Neck CT 04/14/2014.  FINDINGS: Pharynx and larynx: Diffuse mild pharyngeal and supraglottic laryngeal mucosal space soft tissue thickening compatible with sequelae of radiation. No residual vallecula mass. Negative parapharyngeal and retropharyngeal spaces.  Salivary glands: Negative submandibular and parotid glands. Negative sublingual space.  Thyroid: Diminutive, negative.  Lymph nodes: Regressed bilateral level 2 lymph nodes since July 2015. No cervical lymphadenopathy.  Vascular: Major vascular structures in the neck and at the skullbase are patent. There is chronic soft and calcified carotid bifurcation atherosclerosis. The vertebrobasilar system is diminutive.  Limited intracranial: Negative.  Visualized orbits: Negative.  Mastoids and visualized paranasal sinuses: Mild bubbly opacity in the right sphenoid sinus. Other Visualized paranasal  sinuses and mastoids are clear.  Skeleton: Congenital incomplete segmentation of C2-C3. Chronic C4 to T1 level ACDF. Solid interbody arthrodesis suspected except at C7-T1.  Upper chest: Reported separately.  IMPRESSION: 1. Satisfactory post treatment appearance of the neck. No residual vallecula tumor or cervical lymphadenopathy. 2. Chest and abdomen CT findings today reported separately. 3. Carotid atherosclerosis. Previous ACDF with possible pseudarthrosis at C7-T1.   Electronically Signed   By: Genevie Ann M.D.   On: 05/24/2015 16:04   Ct Chest W Contrast  05/24/2015   CLINICAL DATA:  Vallecular carcinoma. Diagnosis July 1,015. Radiation therapy October 2015  EXAM: CT CHEST, ABDOMEN, AND PELVIS WITH CONTRAST  TECHNIQUE: Multidetector CT imaging of the chest, abdomen and pelvis was performed following the standard protocol during bolus administration of intravenous contrast.  CONTRAST:  44mL OMNIPAQUE IOHEXOL 300 MG/ML  SOLN  COMPARISON:  PET-CT scan 11/14/2014  FINDINGS: CT CHEST FINDINGS  Mediastinum/Nodes: Small 5 mm short axis axial lymph nodes are noted on the LEFT and RIGHT. No supraclavicular adenopathy. No mediastinal adenopathy. No hilar adenopathy. No central pulmonary embolism. Esophagus normal. No pericardial fluid.  Lungs/Pleura: No suspicious pulmonary nodules. No pleural fluid. Airways are normal  CT ABDOMEN AND PELVIS FINDINGS  Hepatobiliary: No focal hepatic lesion. No biliary duct dilatation. Gallbladder is normal. Common bile duct is dilated 8 mm is increased from comparison exams. No mass is evident in the pancreatic head. No pancreatic duct dilatation.  Pancreas: Pancreas is normal. No ductal dilatation. No pancreatic inflammation.  Spleen: Normal spleen  Adrenals/urinary tract: Adrenal glands and kidneys are normal.  Stomach/Bowel: Stomach and limited view of the small bowel colon are unremarkable.  Vascular/Lymphatic: Abdominal aorta is normal caliber with atherosclerotic calcification. There  is no retroperitoneal or periportal lymphadenopathy. No pelvic lymphadenopathy.  Musculoskeletal: No aggressive osseous lesion.  IMPRESSION: Chest Impression:  1. No evidence of thoracic metastasis.  Abdomen / Pelvis Impression:  1. Interval increase in common bile duct dilatation without obstructing lesion identified is likely a benign finding but recommend correlation with bilirubin levels. 2. Atherosclerotic calcification of aorta.   Electronically Signed   By: Suzy Bouchard M.D.   On: 05/24/2015 17:00   Ct Abdomen W Contrast  05/24/2015   CLINICAL DATA:  Vallecular carcinoma. Diagnosis July 1,015. Radiation therapy October 2015  EXAM: CT CHEST, ABDOMEN, AND PELVIS WITH CONTRAST  TECHNIQUE: Multidetector CT imaging of the chest, abdomen and pelvis was performed following the standard protocol during bolus administration of intravenous contrast.  CONTRAST:  31mL OMNIPAQUE IOHEXOL 300 MG/ML  SOLN  COMPARISON:  PET-CT scan 11/14/2014  FINDINGS: CT CHEST FINDINGS  Mediastinum/Nodes: Small 5 mm short axis axial lymph nodes are noted on the LEFT and RIGHT. No supraclavicular adenopathy. No mediastinal adenopathy. No hilar adenopathy. No central pulmonary embolism. Esophagus normal. No pericardial fluid.  Lungs/Pleura: No suspicious pulmonary nodules. No pleural fluid. Airways are normal  CT ABDOMEN AND PELVIS FINDINGS  Hepatobiliary: No focal hepatic lesion. No biliary duct dilatation. Gallbladder is normal. Common bile duct is dilated 8 mm is increased from comparison exams. No mass is evident in the pancreatic head. No pancreatic duct dilatation.  Pancreas: Pancreas is normal. No ductal dilatation. No pancreatic inflammation.  Spleen: Normal spleen  Adrenals/urinary tract: Adrenal glands and kidneys are normal.  Stomach/Bowel: Stomach and limited view of the small bowel colon are unremarkable.  Vascular/Lymphatic: Abdominal aorta is normal caliber with atherosclerotic calcification. There is no retroperitoneal  or periportal lymphadenopathy. No pelvic lymphadenopathy.  Musculoskeletal: No aggressive osseous lesion.  IMPRESSION: Chest Impression:  1. No evidence of thoracic metastasis.  Abdomen / Pelvis Impression:  1. Interval increase in common bile duct dilatation without obstructing lesion identified is likely a benign finding but recommend correlation with bilirubin levels. 2. Atherosclerotic calcification of aorta.   Electronically Signed   By: Suzy Bouchard M.D.   On: 05/24/2015 17:00   Impression/Plan:    1) Head and Neck Cancer Status: concerning weight loss and multiple issues:  2) Nutritional Status: Have been referred to nutrition on 8/17. - weight: down 2 lbs from 2 weeks ago - PEG tube: Removed  3) Risk Factors: The patient has been educated about risk factors including alcohol and tobacco abuse; she understands that avoidance of alcohol and tobacco is important to prevent recurrences as well as other cancers. The patient continues to use tobacco. The patient was counseled to stop using tobacco  and was offered pharmacotherapy and further counseling to help with this. The patient declined pharmacotherapy and further counseling at this time.  4) Swallowing: Difficulty swallowing, but is able to swallow soft food. Declines SLP at present.  5) Dental: Encouraged to continue regular followup with dentistry, and dental hygiene including fluoride rinses.  6) Thyroid function: Dr. Justin Mend increased the pt's Levothyroxine from 52mcg to 62mcg. Dr. Justin Mend is managing her thyroid levels and medication. TSH has improved from 106 to 6.3 over the past 6 months  Lab Results  Component Value Date   TSH 6.341* 05/23/2015    7) Social: Father passed within the last year. Pt reports that she feels depressed. Her depression is being managed by her PCP and she has not been seeing her therapist recently. She discussed a plan for psychiatric therapy with our social worker earlier this week.  7a) has chronic  pain, weakness, deconditioning. Has been referred to PT and plans to continue to seeing PT twice a week.  8) Overall plan: I will order additional lab to check her bilirubin in light of benign appearing bile duct dilatation on her scan. This will be scheduled today. Gayleen Orem, RN, our Head and Neck Oncology Navigator will have the pt refered her back to ENT for laryngoscopy in 1-2 months. I gave the pt a card and she should follow up with me in 3 months. The patient was encouraged to call with any issues or questions before then.   This document serves as a record of services personally performed by Eppie Gibson, MD. It was created on her behalf by Darcus Austin, a trained medical scribe. The creation of this record is based on the scribe's personal observations and the provider's statements to them. This document has been checked and approved by the attending provider.   Eppie Gibson, MD

## 2015-05-27 NOTE — Progress Notes (Signed)
  Oncology Nurse Navigator Documentation   Navigator Encounter Type: Clinic/MDC (05/27/15 0900)     To provide support and encouragement, care continuity and to assess for needs, met with patient during f/u appt with Dr. Isidore Moos.  She was accompanied by her son [name]. I provided further encouragement to attend upcoming H&N FYNN. I will arrange f/u ENT appt with preferred [facility]. She denied any navigator needs, I encouraged her to contact me if that changes before I see her next, she verbalized understanding.  Gayleen Orem, RN, BSN, [place] at [place] [phone]

## 2015-05-28 ENCOUNTER — Telehealth: Payer: Self-pay | Admitting: *Deleted

## 2015-05-28 NOTE — Telephone Encounter (Signed)
  Oncology Nurse Navigator Documentation   Navigator Encounter Type: Telephone (05/28/15 1154)      Called patient, LVM, requesting name of preferred ENT, date of PEG removal.  Asked for return call.  Gayleen Orem, RN, BSN, [place] [facility] [place] [phone]

## 2015-05-30 ENCOUNTER — Ambulatory Visit: Payer: BLUE CROSS/BLUE SHIELD

## 2015-05-30 DIAGNOSIS — G894 Chronic pain syndrome: Secondary | ICD-10-CM

## 2015-05-30 DIAGNOSIS — R5381 Other malaise: Secondary | ICD-10-CM | POA: Diagnosis not present

## 2015-05-30 DIAGNOSIS — Z9181 History of falling: Secondary | ICD-10-CM

## 2015-05-30 NOTE — Therapy (Signed)
[place], [place], [address] Phone: [phone]   Fax:  [phone]  Physical Therapy Treatment  Patient Details  Name: Stacey Carroll MRN: [id number] Date of Birth: [birth year] Referring Provider:  Maurice Small, MD  Encounter Date: 05/30/2015      PT End of Session - 05/30/15 1707    Visit Number 2   Number of Visits 17   Date for PT Re-Evaluation 07/22/15   PT Start Time 2633   PT Stop Time 1521   PT Time Calculation (min) 48 min   Activity Tolerance Patient tolerated treatment well  Pt did have her usual LBP that increased with some exercises but we were able to complete all to her tolerance.   Behavior During Therapy [facility] for tasks assessed/performed      Past Medical History  Diagnosis Date  . Visual disturbance 03/11/2013  . Dyslipidemia   . Headache(784.0)   . Depression   . Endometriosis   . Hypercholesterolemia   . Anxiety   . Scoliosis   . Arthritis   . Throat pain 04/19/2014  . Dysphagia 04/27/2014  . Cancer   . PONV (postoperative nausea and vomiting)   . Hypertension     not taking meds  . Asthma   . Cough   . Nausea alone 05/31/2014  . S/P radiation therapy 05/24/2014-07/18/2014    7000  cGy/ Vallecula/neck    Past Surgical History  Procedure Laterality Date  . Cervical laminectomy  2007 (?), 2011  . Shoulder surgery Left   . Abdominal hysterectomy  1987  . Laryngoscopy and esophagoscopy N/A 04/14/2014    Procedure: DIRECT LARYNGOSCOPY WITH BIOPSY  AND ESOPHAGOSCOPY;  Surgeon: Ruby Cola, MD;  Location: [facility];  Service: ENT;  Laterality: N/A;  . Portacath placement N/A 05/18/2014    Procedure: INSERTION PORT-A-CATH;  Surgeon: Rolm Bookbinder, MD;  Location: [facility];  Service: General;  Laterality: N/A;  . Gastrostomy N/A 05/18/2014    Procedure: GASTROSTOMY/PLACEMENT OF G-TUBE;  Surgeon: Rolm Bookbinder, MD;  Location: [facility];  Service: General;  Laterality: N/A;  .  Tracheostomy tube placement N/A 05/18/2014    Procedure: TRACHEOSTOMY;  Surgeon: Melissa Montane, MD;  Location: [facility];  Service: ENT;  Laterality: N/A;    There were no vitals filed for this visit.  Visit Diagnosis:  Physical deconditioning  Chronic pain syndrome  Risk for falls  Personal history of fall      Subjective Assessment - 05/30/15 1437    Subjective I had 6 shots done to my back, 3 on each side, to numb me and they said if that helped they were going to go in and numb those nervers for 6 months. I felt like it helped.    Currently in Pain? Yes   Pain Score 5    Pain Location Back   Pain Orientation Lower   Pain Descriptors / Indicators Burning   Pain Type Chronic pain   Aggravating Factors  being up, housework   Pain Relieving Factors pain meds, the shots I recently got helped temporarily            [facility] PT Assessment - 05/30/15 0001    6 minute walk test results    Aerobic Endurance Distance Walked 272   Endurance additional comments 2 minute walk test done and pt tolerated very well with no increase SOB and reported not feeling increase exertion after.  [facility] Adult PT Treatment/Exercise - 05/30/15 0001    Lumbar Exercises: Stretches   Passive Hamstring Stretch 3 reps;10 seconds  used towel at ball of foot   Lower Trunk Rotation 3 reps;10 seconds   Piriformis Stretch 3 reps;10 seconds  Used a towel behind knee   Lumbar Exercises: Supine   Straight Leg Raise 10 reps  Bil   Straight Leg Raises Limitations Some Rt hip pain towards the end of reps   Other Supine Lumbar Exercises Meeks Decmpression Exercises Series all 5 reps, 5 seconds.   Knee/Hip Exercises: Standing   Hip Flexion Stengthening;Both  8 reps each   Hip Abduction Stengthening;Both  8 reps each   Hip Extension Stengthening;Both  8 reps   Shoulder Exercises: Supine   Horizontal ABduction AROM;5 reps  2 sets                PT Education - 05/30/15  1524    Education provided Yes   Education Details Lumbar flexibility and hip strength   Person(s) Educated Patient   Methods Explanation;Demonstration;Handout   Comprehension Verbalized understanding;Returned demonstration;Need further instruction           Short Term Clinic Goals - 05/30/15 1712    CC Short Term Goal  #1   Title pt will be independent in a basic home exercise program.  Issued initial HEP today 05/30/15   Status Partially Met   CC Short Term Goal  #2   Title pt will be able to stay outside in her garden for one half hour at time before fatiguing   Status On-going             [place] - 05/23/15 1253    CC Long Term Goal  #1   Title pt will be able to be ready to transition to a communtiy exercise program ([id number]) so she can continue to get stronger on her own.,   Time 8   Period Weeks   Status New   CC Long Term Goal  #2   Title Reduce TUG score to 13 seconds or less, indicating decreased risk for falls.   Time 8   Period Weeks   Status New   CC Long Term Goal  #3   Title Patient will report at least 50% improved endurance and tolerance for everyday activities like washing dishes.   Time 8   Period Weeks   Status New   CC Long Term Goal  #4   Title Patient will be able to walk for 15 minutes without needing to stop to sit down.   Time 8   Period Weeks   Status New            Plan - 05/30/15 1709    Clinical Impression Statement Ms. Stacey Carroll did very well with her first treatment. She tolerated all exercises well with LBP/Rt hip pain increasing a little with standing activities but was able to complete them. She was able to ambulate 272' for 2 min walk test and did not have increase LBP or fatigue, in fact reporting she felt better after.    Pt will benefit from skilled therapeutic intervention in order to improve on the following deficits Decreased activity tolerance;Decreased endurance;Decreased balance;Decreased  strength;Difficulty walking   Rehab Potential Good   Clinical Impairments Affecting Rehab Potential Continuing weight loss   PT Frequency 2x / week   PT Duration 8 weeks   PT Treatment/Interventions Therapeutic exercise;Patient/family education;ADLs/Self Care Home Management;Balance training  PT Next Visit Plan Continue endurance training, strengthening exercise, and balance training.  Review HEP instruction.   PT Home Exercise Plan Lumbar flexibility/hip strength and pt to begin walking routine in her home for now.    Consulted and Agree with Plan of Care Patient        Problem List Patient Active Problem List   Diagnosis Date Noted  . Bilateral hearing loss 01/17/2015  . Cough 06/26/2014  . Preventive measure 06/14/2014  . Anemia in neoplastic disease 06/14/2014  . Nausea without vomiting 05/31/2014  . S/P percutaneous endoscopic gastrostomy (PEG) tube placement 05/24/2014  . Tracheostomy status 05/24/2014  . Protein-calorie malnutrition, severe 05/19/2014  . Dysphagia 04/27/2014  . 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 Throat pain 04/19/2014  . 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 DJD (degenerative joint disease) of cervical spine 04/19/2014    Otelia Limes, PTA 05/30/2015, 5:13 PM  [place], [place], [address] Phone: [phone]   Fax:  [phone]

## 2015-05-30 NOTE — Patient Instructions (Addendum)
Lower Trunk Rotation   Bend both knees. Rotate from side to side, keeping knees together and tummy tight when pulling legs back up. . Repeat __3__ times per set holding 10-20 seconds.. Do __2-3__ sessions per day.  http://orth.exer.us/151   Copyright  VHI. All rights reserved.   Piriformis Stretch, Supine   Lie supine, one ankle crossed onto opposite knee. Holding bottom leg behind knee, gently pull legs toward chest until stretch is felt in buttock of top leg. Hold ___ seconds. For deeper stretch gently push top knee away from body.  Repeat _3__ times per session, hold 10-20 seconds. Do _2-3__ sessions per day.  Copyright  VHI. All rights reserved.   Hamstring Step 3   Keep left leg completely streaight. Warning: Intense stretch. Stay within tolerance. Hold _10-20__ seconds.  Repeat _3__ times then repeat with other leg.   Copyright  VHI. All rights reserved.    Standing at counter for 3 way raises:    Stand and support self while swinging uninvolved leg and hip forward __8-10_ times. Repeat with other leg. Then do the same swinging leg backwards keeping chest up. Lastly swing leg out to the side keeping toes pointing forward.  Do _3__ times per day.  Copyright  VHI. All rights reserved.

## 2015-06-04 ENCOUNTER — Ambulatory Visit: Payer: BLUE CROSS/BLUE SHIELD | Admitting: Physical Therapy

## 2015-06-04 DIAGNOSIS — G894 Chronic pain syndrome: Secondary | ICD-10-CM

## 2015-06-04 DIAGNOSIS — R5381 Other malaise: Secondary | ICD-10-CM

## 2015-06-04 DIAGNOSIS — Z9181 History of falling: Secondary | ICD-10-CM

## 2015-06-04 NOTE — Therapy (Signed)
[place], [place], [address] Phone: [phone]   Fax:  [phone]  Physical Therapy Treatment  Patient Details  Name: Stacey Carroll MRN: [id number] Date of Birth: 02-Jul-1961 Referring Provider:  Maurice Small, MD  Encounter Date: 06/04/2015      PT End of Session - 06/04/15 1557    Visit Number 3   Number of Visits 17   Date for PT Re-Evaluation 07/22/15   PT Start Time 6629   PT Stop Time 1602   PT Time Calculation (min) 47 min   Activity Tolerance Patient tolerated treatment well;Patient limited by fatigue   Behavior During Therapy [facility] for tasks assessed/performed      Past Medical History  Diagnosis Date  . Visual disturbance 03/11/2013  . Dyslipidemia   . Headache(784.0)   . Depression   . Endometriosis   . Hypercholesterolemia   . Anxiety   . Scoliosis   . Arthritis   . Throat pain 04/19/2014  . Dysphagia 04/27/2014  . Cancer   . PONV (postoperative nausea and vomiting)   . Hypertension     not taking meds  . Asthma   . Cough   . Nausea alone 05/31/2014  . S/P radiation therapy 05/24/2014-07/18/2014    7000  cGy/ Vallecula/neck    Past Surgical History  Procedure Laterality Date  . Cervical laminectomy  2007 (?), 2011  . Shoulder surgery Left   . Abdominal hysterectomy  1987  . Laryngoscopy and esophagoscopy N/A 04/14/2014    Procedure: DIRECT LARYNGOSCOPY WITH BIOPSY  AND ESOPHAGOSCOPY;  Surgeon: Ruby Cola, MD;  Location: [place];  Service: ENT;  Laterality: N/A;  . Portacath placement N/A 05/18/2014    Procedure: INSERTION PORT-A-CATH;  Surgeon: Rolm Bookbinder, MD;  Location: [place];  Service: General;  Laterality: N/A;  . Gastrostomy N/A 05/18/2014    Procedure: GASTROSTOMY/PLACEMENT OF G-TUBE;  Surgeon: Rolm Bookbinder, MD;  Location: [place];  Service: General;  Laterality: N/A;  . Tracheostomy tube placement N/A 05/18/2014    Procedure: TRACHEOSTOMY;  Surgeon: Melissa Montane, MD;  Location: [place];  Service: ENT;  Laterality: N/A;    There were no vitals filed for this visit.  Visit Diagnosis:  Physical deconditioning  Chronic pain syndrome  Risk for falls  Personal history of fall      Subjective Assessment - 06/04/15 1524    Subjective I felt good after the last time.  I had a long ride home and I did really well.  I put a new pain patch on today and it takes a while to work.   Pertinent History Diagnosed with throat cancer 04/10/14; treated with chemoradiation, which was completed in October.  Reports she was supposed to have back and neck surgery before she got the diagnosis of cancer last July. She has lost 60 pounds since prior to diagnosis and is weak. She has had 2 surgeries on her neck and one on her back She has plates and screws in her neck, but thinks the screws are coming out. She has three herniated discs in low back for which she has been receiving injections, and those haven't helped. Her feeding tube was removed. Left shoulder hemiarthroplasty with limited shoulder range of motion.  Patient Stated Goals Get to where I can walk better without falling and get stronger so I can walk around and get up and do things; wants to get stronger to be able to do back and neck surgery.   Currently in Pain? Yes   Pain Score 6    Pain Location Back   Pain Orientation Lower   Pain Descriptors / Indicators Burning   Pain Type Chronic pain   Pain Onset More than a month ago   Pain Frequency Constant                         [facility] Adult PT Treatment/Exercise - 06/04/15 0001    Lumbar Exercises: Supine   Clam 10 reps  with manual resistance   Bridge 10 reps   Straight Leg Raise 10 reps  x10 on each leg   Straight Leg Raises Limitations Today some c/o left hip pain   Other Supine Lumbar Exercises Short arc quad x10 BLE with 3 second holds and verbal  cues for proper technique   Other Supine Lumbar Exercises S/L hip abduction x10 on left and then in supine x10 on right (unable ot lie on right side)   Knee/Hip Exercises: Aerobic   Stationary Bike x5 minutes with close monitoring of breathing and fatigue   Knee/Hip Exercises: Standing   Other Standing Knee Exercises Sit to stand with BUE x10   Knee/Hip Exercises: Seated   Long Arc Quad AROM;Both;10 reps   Ball Squeeze x10 with 3 second holds and verbal cues for proper technique   Clamshell with TheraBand Red  2x10 with verbal cues to work in ROM that does not increase    Marching AAROM;Both                   Short Term Clinic Goals - 05/30/15 1712    CC Short Term Goal  #1   Title pt will be independent in a basic home exercise program.  Issued initial HEP today 05/30/15   Status Partially Met   CC Short Term Goal  #2   Title pt will be able to stay outside in her garden for one half hour at time before fatiguing   Status On-going             [place] - 05/23/15 1253    CC Long Term Goal  #1   Title pt will be able to be ready to transition to a communtiy exercise program (HW:[id number]) so she can continue to get stronger on her own.,   Time 8   Period Weeks   Status New   CC Long Term Goal  #2   Title Reduce TUG score to 13 seconds or less, indicating decreased risk for falls.   Time 8   Period Weeks   Status New   CC Long Term Goal  #3   Title Patient will report at least 50% improved endurance and tolerance for everyday activities like washing dishes.   Time 8   Period Weeks   Status New   CC Long Term Goal  #4   Title Patient will be able to walk for 15 minutes without needing to stop to sit down.   Time 8   Period Weeks   Status New            Plan - 06/04/15 1558    Clinical Impression Statement She did well with exercises today but had visible shakiness  and fatigue with exercise but she was reluctant to complain as she is  determined to get better.  She will continue to benefit from PT to regain her strength.   Pt will benefit from skilled therapeutic intervention in order to improve on the following deficits Decreased activity tolerance;Decreased endurance;Decreased balance;Decreased strength;Difficulty walking   Rehab Potential Good   Clinical Impairments Affecting Rehab Potential Continuing weight loss   PT Frequency 2x / week   PT Treatment/Interventions Therapeutic exercise;Patient/family education;ADLs/Self Care Home Management;Balance training   PT Next Visit Plan Continue endurance training, strengthening exercise, and balance training.  Review HEP instruction.   PT Home Exercise Plan Encouraged her to increase her walking time and she verbalized understanding.   Consulted and Agree with Plan of Care Patient        Problem List Patient Active Problem List   Diagnosis Date Noted  . Bilateral hearing loss 01/17/2015  . Cough 06/26/2014  . Preventive measure 06/14/2014  . Anemia in neoplastic disease 06/14/2014  . Nausea without vomiting 05/31/2014  . S/P percutaneous endoscopic gastrostomy (PEG) tube placement 05/24/2014  . Tracheostomy status 05/24/2014  . Protein-calorie malnutrition, severe 05/19/2014  . Dysphagia 04/27/2014  . 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 Throat pain 04/19/2014  . 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 DJD (degenerative joint disease) of cervical spine 04/19/2014   Annia Friendly, PT 06/04/2015 4:01 PM  [place], [place], [address] Phone: [phone]   Fax:  [phone]

## 2015-06-06 ENCOUNTER — Ambulatory Visit: Payer: BLUE CROSS/BLUE SHIELD

## 2015-06-06 DIAGNOSIS — R5381 Other malaise: Secondary | ICD-10-CM

## 2015-06-06 DIAGNOSIS — Z9181 History of falling: Secondary | ICD-10-CM

## 2015-06-06 DIAGNOSIS — G894 Chronic pain syndrome: Secondary | ICD-10-CM

## 2015-06-06 NOTE — Therapy (Addendum)
[place], [place], [address] Phone: [phone]   Fax:  [phone]  Physical Therapy Treatment  Patient Details  Name: Stacey Carroll MRN: [id number] Date of Birth: [birth year] Referring Provider:  Eppie Gibson, MD  Encounter Date: 06/06/2015      PT End of Session - 06/06/15 1424    Visit Number 4   Number of Visits 17   Date for PT Re-Evaluation 07/22/15   PT Start Time 1350   PT Stop Time 1432   PT Time Calculation (min) 42 min   Activity Tolerance Patient tolerated treatment well;Patient limited by fatigue   Behavior During Therapy [facility] for tasks assessed/performed      Past Medical History  Diagnosis Date  . Visual disturbance 03/11/2013  . Dyslipidemia   . Headache(784.0)   . Depression   . Endometriosis   . Hypercholesterolemia   . Anxiety   . Scoliosis   . Arthritis   . Throat pain 04/19/2014  . Dysphagia 04/27/2014  . Cancer   . PONV (postoperative nausea and vomiting)   . Hypertension     not taking meds  . Asthma   . Cough   . Nausea alone 05/31/2014  . S/P radiation therapy 05/24/2014-07/18/2014    7000  cGy/ Vallecula/neck    Past Surgical History  Procedure Laterality Date  . Cervical laminectomy  2007 (?), 2011  . Shoulder surgery Left   . Abdominal hysterectomy  1987  . Laryngoscopy and esophagoscopy N/A 04/14/2014    Procedure: DIRECT LARYNGOSCOPY WITH BIOPSY  AND ESOPHAGOSCOPY;  Surgeon: Ruby Cola, MD;  Location: [place];  Service: ENT;  Laterality: N/A;  . Portacath placement N/A 05/18/2014    Procedure: INSERTION PORT-A-CATH;  Surgeon: Rolm Bookbinder, MD;  Location: [place];  Service: General;  Laterality: N/A;  . Gastrostomy N/A 05/18/2014    Procedure: GASTROSTOMY/PLACEMENT OF G-TUBE;  Surgeon: Rolm Bookbinder, MD;  Location: [place];  Service: General;  Laterality: N/A;  . Tracheostomy tube placement N/A 05/18/2014    Procedure: TRACHEOSTOMY;  Surgeon: Melissa Montane, MD;  Location: [place];  Service: ENT;  Laterality: N/A;    There were no vitals filed for this visit.  Visit Diagnosis:  Physical deconditioning  Chronic pain syndrome  Risk for falls  Personal history of fall      Subjective Assessment - 06/06/15 1354    Subjective I feel real wobbly today, very shakiy, that's why I brought my cane with me. Woke up last night at least 6x with neck and LBP. Been doing that for about 2 weeks, and that's with my sleeping pill.   Currently in Pain? Yes   Pain Score 8    Pain Location Back   Pain Orientation Lower   Pain Descriptors / Indicators Stabbing;Aching   Pain Type Chronic pain   Aggravating Factors  nothing really   Pain Relieving Factors leaning against something   Multiple Pain Sites Yes   Pain Score 7   Pain Location Neck   Pain Descriptors / Indicators Stabbing;Aching;Burning   Aggravating Factors  sitting in my chair to relax my body   Pain Relieving Factors standing, walking around                         [facility] Adult PT Treatment/Exercise - 06/06/15 0001    Lumbar Exercises: Stretches   Single Knee to Chest Stretch 3 reps;10 seconds   Lumbar Exercises: Supine  Bridge 10 reps   Straight Leg Raise 10 reps;3 seconds  1 lb each ankle   Straight Leg Raises Limitations Pt reports very minimal Lt hip pain with this today.    Other Supine Lumbar Exercises Short arc quad 2 sets, x10 BLE with 3 second holds and verbal cues for proper technique; then bolster squeeze 20 reps  1 lb each ankle   Other Supine Lumbar Exercises S/L hip abduction and then clamshell x10 each on left and then in supine x10 each on right (unable ot lie on right side)   Knee/Hip Exercises: Aerobic   Stationary Bike Level 1 x5 minutes with close monitoring of breathing and fatigue   Knee/Hip Exercises: Standing   Hip Flexion Stengthening;15 reps;1 set;Both  1 lb each ankle   Hip Flexion Limitations Pt very unsteady today with standing all  activities.   Hip Abduction Stengthening;Both;10 reps  1 lb each ankle   Knee/Hip Exercises: Seated   Long Arc Quad AROM;Strengthening;Both;2 sets;10 reps;Weights   Long Arc Quad Weight 1 lbs.   Marching AROM;Strengthening;Both;2 sets;15 reps;Weights   Marching Limitations Just cuing to decrease pace   Marching Weights 1 lbs.                   Short Term Clinic Goals - 06/06/15 1432    CC Short Term Goal  #1   Title pt will be independent in a basic home exercise program.   Status On-going             Long Term Clinic Goals - 05/23/15 1253    CC Long Term Goal  #1   Title pt will be able to be ready to transition to a communtiy exercise program (GY:[id number]) so she can continue to get stronger on her own.,   Time 8   Period Weeks   Status New   CC Long Term Goal  #2   Title Reduce TUG score to 13 seconds or less, indicating decreased risk for falls.   Time 8   Period Weeks   Status New   CC Long Term Goal  #3   Title Patient will report at least 50% improved endurance and tolerance for everyday activities like washing dishes.   Time 8   Period Weeks   Status New   CC Long Term Goal  #4   Title Patient will be able to walk for 15 minutes without needing to stop to sit down.   Time 8   Period Weeks   Status New            Plan - 06/06/15 1425    Clinical Impression Statement Pt continues to do well with exercises though she continues to present with visibile shakiness and fatigue. She reports her neck and back pain have really been interupting her sleep and thinks this may be contributing.    Pt will benefit from skilled therapeutic intervention in order to improve on the following deficits Decreased activity tolerance;Decreased endurance;Decreased balance;Decreased strength;Difficulty walking   Rehab Potential Good   Clinical Impairments Affecting Rehab Potential Continuing weight loss   PT Frequency 2x / week   PT Duration 8 weeks   PT  Treatment/Interventions Therapeutic exercise;Patient/family education;ADLs/Self Care Home Management;Balance training   PT Next Visit Plan Continue endurance training, strengthening exercise, and balance training.  Review HEP instruction.   Consulted and Agree with Plan of Care Patient        Problem List Patient Active Problem List   Diagnosis Date  Noted  . Bilateral hearing loss 01/17/2015  . Cough 06/26/2014  . Preventive measure 06/14/2014  . Anemia in neoplastic disease 06/14/2014  . Nausea without vomiting 05/31/2014  . S/P percutaneous endoscopic gastrostomy (PEG) tube placement 05/24/2014  . Tracheostomy status 05/24/2014  . Protein-calorie malnutrition, severe 05/19/2014  . Dysphagia 04/27/2014  . 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 Throat pain 04/19/2014  . 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 DJD (degenerative joint disease) of cervical spine 04/19/2014    Otelia Limes, PTA 06/06/2015, 2:34 PM  [place], [place], [address] Phone: [phone]   Fax:  [phone]     PHYSICAL THERAPY DISCHARGE SUMMARY  Visits from Start of Care: 4  Current functional level related to goals / functional outcomes: See above.  She did not return after her 4th visit for unknown reasons.   Remaining deficits: Unknown.   Education / Equipment: none  Plan: Patient agrees to discharge.  Patient goals were not met. Patient is being discharged due to not returning since the last visit.  ?????        Annia Friendly, [place] 11/05/2015 10:02 AM

## 2015-06-07 ENCOUNTER — Encounter: Payer: Self-pay | Admitting: *Deleted

## 2015-06-07 NOTE — Progress Notes (Signed)
[place] Work  Clinical Social Work contacted patient at home to follow up from previous visit with CSW.  Stacey Carroll reported she was "doing better" regarding coping with anxiety and depression.  She stated she has not scheduled visit with Eino Farber, PA at [facility] (she saw her over a year ago prior to cancer treatment).  CSW emphasized the importance of psychiatric care for  Medication management and counseling to cope with anxiety/depression.  The patient shared she "has no memory" and "keeps forgetting to call them".  She stated she gets overwhelmed with multiple appointments.  CSW contact [facility] on patient's behalf to schedule appointment.  The next available appointment for psychiatry is mid-October and they require $150 up front at time of first visit.  CSW provided necessary information and requested [facility] Office contact patient to schedule appointment.  Polo Riley, MSW, LCSW, OSW-C Clinical Social Worker [facility] [phone]

## 2015-06-08 ENCOUNTER — Telehealth: Payer: Self-pay | Admitting: *Deleted

## 2015-06-08 NOTE — Telephone Encounter (Signed)
  Oncology Nurse Navigator Documentation    Navigator Encounter Type: Telephone (06/08/15 1102)         Interventions: Coordination of Care (06/08/15 1102)     Per Dr. Pearlie Oyster 8/19 f/u appt with pt, called [facility] ENT.  Spoke with Janett Billow, requested that patient be contacted and appt be arranged to see Dr. Janace Hoard in 1-2 months from 8/19, appt to include laryngoscopy.  She verbalized understanding.  Gayleen Orem, RN, BSN, [place] [facility] [place] [phone]            Time Spent with Patient: 15 (06/08/15 1102)

## 2015-06-08 NOTE — Telephone Encounter (Signed)
Entry error

## 2015-06-12 ENCOUNTER — Ambulatory Visit: Payer: BLUE CROSS/BLUE SHIELD | Attending: Radiation Oncology | Admitting: Physical Therapy

## 2015-06-14 ENCOUNTER — Ambulatory Visit: Payer: BLUE CROSS/BLUE SHIELD | Admitting: Physical Therapy

## 2015-06-18 ENCOUNTER — Ambulatory Visit: Payer: BLUE CROSS/BLUE SHIELD | Admitting: Physical Therapy

## 2015-06-20 ENCOUNTER — Encounter: Payer: Self-pay | Admitting: Physical Therapy

## 2015-08-06 ENCOUNTER — Other Ambulatory Visit: Payer: Self-pay | Admitting: Orthopaedic Surgery

## 2015-08-06 DIAGNOSIS — G894 Chronic pain syndrome: Secondary | ICD-10-CM

## 2015-08-09 ENCOUNTER — Ambulatory Visit
Admission: RE | Admit: 2015-08-09 | Discharge: 2015-08-09 | Disposition: A | Discharge status: Home / Self Care | Payer: BLUE CROSS/BLUE SHIELD | Source: Ambulatory Visit | Attending: Orthopaedic Surgery | Admitting: Orthopaedic Surgery

## 2015-08-09 DIAGNOSIS — G894 Chronic pain syndrome: Secondary | ICD-10-CM

## 2015-08-29 ENCOUNTER — Ambulatory Visit
Admission: RE | Admit: 2015-08-29 | Discharge: 2015-08-29 | Disposition: A | Discharge status: Home / Self Care | Payer: BLUE CROSS/BLUE SHIELD | Source: Ambulatory Visit | Attending: Radiation Oncology | Admitting: Radiation Oncology

## 2015-08-29 ENCOUNTER — Ambulatory Visit: Payer: BLUE CROSS/BLUE SHIELD | Admitting: Radiation Oncology

## 2015-09-07 ENCOUNTER — Ambulatory Visit
Admission: RE | Admit: 2015-09-07 | Discharge: 2015-09-07 | Disposition: A | Discharge status: Home / Self Care | Payer: BLUE CROSS/BLUE SHIELD | Source: Ambulatory Visit | Attending: Radiation Oncology | Admitting: Radiation Oncology

## 2015-09-07 ENCOUNTER — Encounter: Payer: Self-pay | Admitting: Adult Health

## 2015-09-07 ENCOUNTER — Encounter: Payer: Self-pay | Admitting: Radiation Oncology

## 2015-09-07 VITALS — BP 134/79 | HR 66 | Temp 97.9°F | Resp 18 | Ht 65.0 in | Wt 104.1 lb

## 2015-09-07 DIAGNOSIS — C100 Malignant neoplasm of vallecula: Secondary | ICD-10-CM | POA: Insufficient documentation

## 2015-09-07 MED ORDER — LARYNGOSCOPY SOLUTION RAD-ONC
15.0000 mL | Freq: Once | TOPICAL | Status: AC
  Administered 2015-09-07: 15 mL via TOPICAL
  Filled 2015-09-07: qty 15

## 2015-09-07 NOTE — Progress Notes (Signed)
Pain issues, if any: Has chronic back pain - using a fentanyl patch - reports new sharp pain in her left side that comes and goes.  She said that started 3 days ago.  She said it is unbearable at night. Using a feeding tube?: no Weight changes, if any: Has gained 6 lbs since August.  Reports she does not feel like eating.  She tries to eat a soft taco or biscuit with gravy plus shakes (generic ensure) during the day. Swallowing issues, if any: no Smoking or chewing tobacco? no Using fluoride trays daily? No - reports having pain in her teeth. Last ENT visit was on: no Other notable issues, if any: Reports having a dry mouth.  Her oral mucosa appears dry.  Her feeding tube site is intact.  She reports it has stopped draining.  She reports having a poor energy level due to back and neck pain.  BP 134/79 mmHg  Pulse 66  Temp(Src) 97.9 F (36.6 C) (Oral)  Resp 18  Ht 5\' 5"  (1.651 m)  Wt 104 lb 1.6 oz (47.219 kg)  BMI 17.32 kg/m2  SpO2 99%   Wt Readings from Last 3 Encounters:  09/07/15 104 lb 1.6 oz (47.219 kg)  05/25/15 98 lb 8 oz (44.679 kg)  05/23/15 97 lb 12.8 oz (44.362 kg)

## 2015-09-07 NOTE — Progress Notes (Signed)
Radiation Oncology         [phone] ________________________________  Name: Stacey Carroll MRN: [id number]  Date: 09/07/2015  DOB: Aug 07, 1961  Follow-Up Visit Note  CC: Jonathon Bellows, MD  Ruby Cola, MD  Diagnosis and Prior Radiotherapy:       ICD-9-CM ICD-10-CM   1. 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 The patient had a CT of the spine on 08/09/15 showing no signs of metastasis.  Pain issues, if any: Has chronic back pain (which proceeded her cancer diagnosis) - using a fentanyl patch. Reports new sharp pain in her left side that comes and goes. She said that started 3 days ago and it is unbearable at night. Using a feeding tube?: Removed Weight changes, if any: Has gained 6 lbs since August. Reports she has a lack of appetite. She tries to eat a soft taco or biscuit with gravy plus shakes (generic ensure) during the day. Wt Readings from Last 3 Encounters:   09/07/15  104 lb 1.6 oz (47.219 kg)   05/25/15  98 lb 8 oz (44.679 kg)   05/23/15  97 lb 12.8 oz (44.362 kg)   Swallowing issues, if any: Sticks to soft food. Smoking or chewing tobacco? She still smokes a "couple" of cigarettes a day. Approximates 4-6 a day. Using fluoride trays daily? No - reports having pain in her teeth.  Hasn't gone to dentist recently but has insurance  Last ENT visit was on: She reports not hearing about having any appointments. Other notable issues, if any: Reports having a dry  mouth. She reports her feeding tube site has stopped draining. She reports having a poor energy level due to back and neck pain. She reports seeing her PCP earlier this morning and had a lab draw/urine test to check for infection since the patient has left flank pain.  ALLERGIES:  is allergic to penicillins; vancomycin; and barium sulfate.  Meds: Current Outpatient Prescriptions  Medication Sig Dispense Refill  . diazepam (VALIUM) 10 MG tablet Take 5 mg by mouth at bedtime. Can take 1 tablet bid prn    . fentaNYL (DURAGESIC - DOSED MCG/HR) 50 MCG/HR Place 1 patch (50 mcg total) onto the skin every 3 (three) days. 10 patch 0  . fexofenadine (ALLEGRA) 180 MG tablet Take 180 mg by mouth daily.    Marland Kitchen levothyroxine (LEVOTHROID) 50 MCG tablet Take 1 tablet (50 mcg total) by mouth daily before breakfast. (Patient taking differently: Take 75 mcg by mouth daily before breakfast. ) 30 tablet 5  . PARoxetine (PAXIL) 40 MG tablet Take 40 mg by mouth every morning.    . traZODone (DESYREL) 50 MG tablet Take 50 mg by mouth at bedtime. Take 1-2 tablets at bedtime    . antiseptic oral rinse (BIOTENE) LIQD 15 mLs by Mouth Rinse route as needed for dry mouth.    Marland Kitchen buPROPion (WELLBUTRIN SR) 150 MG 12 hr tablet Take 150 mg by mouth daily.    . miconazole (MICATIN) 2 % cream Apply TID  to rash over abdomen for 2 weeks. (Patient not taking: Reported on 09/07/2015) 28 g 2  . oxyCODONE-acetaminophen (PERCOCET/ROXICET) 5-325 MG per tablet Take 2 tablets by mouth every 8 (eight) hours as needed for moderate pain or severe pain.     . sodium fluoride (FLUORISHIELD) 1.1 % GEL dental gel Instill one drop of fluoride per tooth space of fluoride tray. Place over teeth for 5 minutes. Remove. Spit out excess. Repeat nightly. (Patient not taking: Reported on 09/07/2015) 120 mL prn   No current facility-administered medications for this encounter.    Physical Findings: The patient is in no acute distress. Patient is alert and  oriented.  height is 5\' 5"  (1.651 m) and weight is 104 lb 1.6 oz (47.219 kg). Her oral temperature is 97.9 F (36.6 C). Her blood pressure is 134/79 and her pulse is 66. Her respiration is 18 and oxygen saturation is 99%.  Abdomen: Skin of the abdomen is without any rashes. Some tenderness to palpation in the left upper quadrant. Some lower left flank tenderness to palpation. Psychiatric: Judgment and insight are intact. Affect is blunted. Neck: No palpable cervical or supraclavicular adenopathy. Chest: Heart has regular rate and rhythm. Lungs are clear to auscultation bilaterally. Extremities: No edema.  PROCEDURE NOTE: After anesthetizing the nasal cavity with topical lidocaine and phenylephrine, the flexible endoscope was introduced and passed through the nasal cavity. Left base of the epiglottis appears to have a tissue deficit/irregularity, but no obvious evidence of tumor. No lesions appreciated in the vallecula . No other lesions appreciated in the larynx. I decided to not navigate the laryngoscope more distally, as the positioning of the epiglottis  made this difficult.    Lab Findings: Lab Results  Component Value Date   WBC 5.3 05/23/2015   HGB 13.3 05/23/2015   HCT 39.6 05/23/2015   MCV 93.3 05/23/2015   PLT 253 05/23/2015    CMP     Component Value Date/Time   NA 140 05/25/2015 1457   NA 140 05/23/2014 0430   K 5.0 05/25/2015 1457   K 4.2 05/23/2014 0430   CL 100 05/23/2014 0430   CO2 31* 05/25/2015 1457   CO2 31 05/23/2014 0430   GLUCOSE 70 05/25/2015 1457   GLUCOSE 107* 05/23/2014 0430   BUN 18.6 05/25/2015 1457   BUN 13 05/23/2014 0430   CREATININE 0.9 05/25/2015 1457   CREATININE 0.62 05/23/2014 0430   CALCIUM 9.8 05/25/2015 1457   CALCIUM 9.7 05/23/2014 0430   PROT 6.9 05/25/2015 1457   PROT 5.6* 05/22/2014 0310   ALBUMIN 3.8 05/25/2015 1457   ALBUMIN 2.2* 05/22/2014 0310   AST 21 05/25/2015 1457   AST 12 05/22/2014 0310   ALT 23 05/25/2015 1457   ALT  9 05/22/2014 0310   ALKPHOS 102 05/25/2015 1457   ALKPHOS 77 05/22/2014 0310   BILITOT <0.20 05/25/2015 1457   BILITOT 0.2* 05/22/2014 0310   GFRNONAA >90 05/23/2014 0430   GFRAA >90 05/23/2014 0430    Lab Results  Component Value Date   TSH 6.341* 05/23/2015   No results found. Impression/Plan:    1) Head and Neck Cancer Status: NED. I suspect the tissue irregularity on laryngoscopy is due to tissue damage from the tumor that has regressed.   2) Nutritional Status:  weight:up 6 pounds since August 2016. Still has poor taste in mouth.  Push nutritional shakes  3) Risk Factors: The patient has been educated about risk factors including alcohol and tobacco abuse; she understands that  avoidance of alcohol and tobacco is important to prevent recurrences as well as other cancers. The patient continues to use tobacco. The patient was counseled to stop using tobacco and was offered pharmacotherapy and further counseling to help with this.  4) Swallowing: Eats soft food.   5) Dental: Encouraged to continue regular followup with dentistry, and dental hygiene including fluoride rinses.  6) Thyroid function: Dr. Justin Mend increased the pt's Levothyroxine from 41mcg to 60mcg. Dr. Justin Mend is managing her thyroid levels and medication. TSH has improved from 106 to 6.3 between February and August 2016. Lab Results  Component Value Date   TSH 6.341* 05/23/2015   7) Social: Father passed within the last year. Pt reports that she feels depressed. Her depression is being managed by her PCP and a therapist.  7a) Has chronic pain, weakness, deconditioning. Has been referred to PT and plans to continue to seeing PT twice a week. PCP managing abdominal/flank pain with tests.  8) Overall plan: The patient was given a "Life After Cancer for Every Survivor" booklet and our Survivorship Navigator, Mike Craze, NP, gave the patient her contact information. The patient will be seen by Mike Craze, NP, in 4  months and by myself 4-6 months after that. Pt was advised to schedule a f/u with ENT in 2 months; she's not seen ENT despite my recommendations.    Eppie Gibson, MD  This document serves as a record of services personally performed by Eppie Gibson, MD. It was created on her behalf by Darcus Austin, a trained medical scribe. The creation of this record is based on the scribe's personal observations and the provider's statements to them. This document has been checked and approved by the attending provider.

## 2015-09-07 NOTE — Progress Notes (Signed)
I briefly met with Stacey Carroll and her son during her routine follow-up with Dr. Isidore Moos.  She will RTC to see me for survivorship in 01/2016, likely with laryngoscopy (if she does not see her ENT MD in 2 months as recommended by Dr. Isidore Moos).  Her hypothyroidism is being managed by her PCP, so I will not order labs at that visit.    We will consider ordering lung cancer screening (low-dose CT), as patient is an active smoker and has a 30 pack year history.  She and her son were given information on smoking cessation today.    Her survivorship appointment was made today and a reminder card was given to her. I also gave her a copy of my business card and encouraged her to call me with any questions or concerns and I would be happy to see her before 4 months, if needed.  She expressed verbal understanding of this plan.  I look forward to participating in her care.   Mike Craze, NP [place] [phone]

## 2016-01-11 ENCOUNTER — Encounter: Payer: Self-pay | Admitting: Adult Health

## 2016-01-11 ENCOUNTER — Telehealth: Payer: Self-pay | Admitting: Adult Health

## 2016-01-11 NOTE — Telephone Encounter (Signed)
I attempted to reach Stacey Carroll re: her no show appt today.  I left a voicemail asking her to return my call so that we could reschedule her appt.  I gave her my direct office number to call me back when she was able.  Awaiting return call.   Mike Craze, NP [employer] [phone]

## 2016-01-24 IMAGING — PT NM PET TUM IMG INITIAL (PI) SKULL BASE T - THIGH
8 series · 25 of 25 positions shown · non-contrast
Comparison: CT neck 04/14/2014.

CLINICAL DATA: Initial treatment strategy for oropharyngeal cancer.

EXAM:
NUCLEAR MEDICINE PET SKULL BASE TO THIGH
TECHNIQUE: 8.0 mCi F-18 FDG was injected intravenously. Full-ring PET imaging
was performed from the skull base to thigh after the radiotracer. CT
data was obtained and used for attenuation correction and anatomic
localization.
FASTING BLOOD GLUCOSE:  Value: 96 mg/dl

[Series 3: pet hn_sk_thigh ac · axial · 5.0mm · 4.07mm/px · z∈[-911,-7]mm · 4 of 227 slices shown]
[im 1/227]
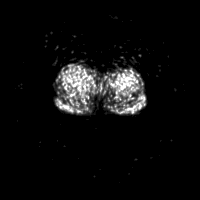
[im 76/227]
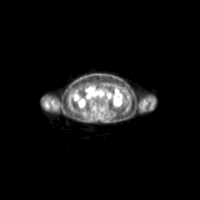
[im 151/227]
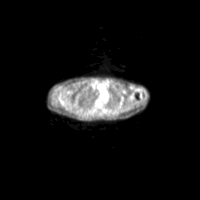
[im 227/227]
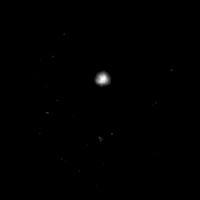

[Series 4: ct hn_sk_th 5.0 b31f · axial · 5.0mm · 0.81mm/px · z∈[-911,-7]mm · 5 of 227 slices shown]
[im 1/227]
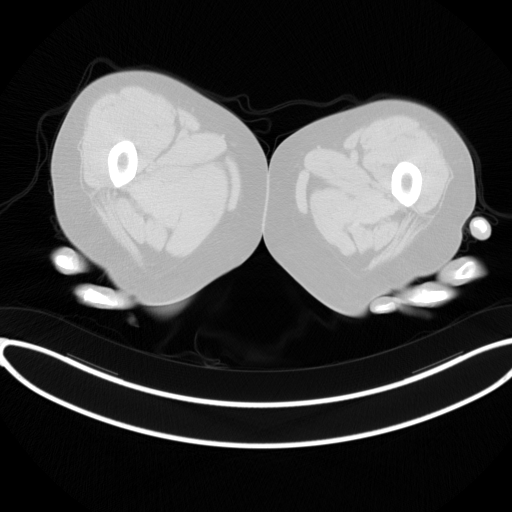
[im 57/227]
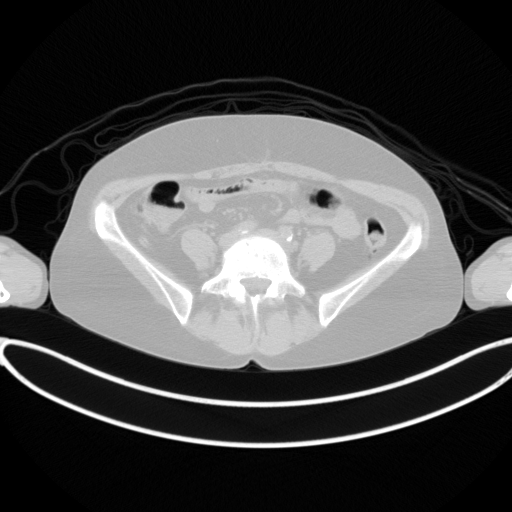
[im 114/227]
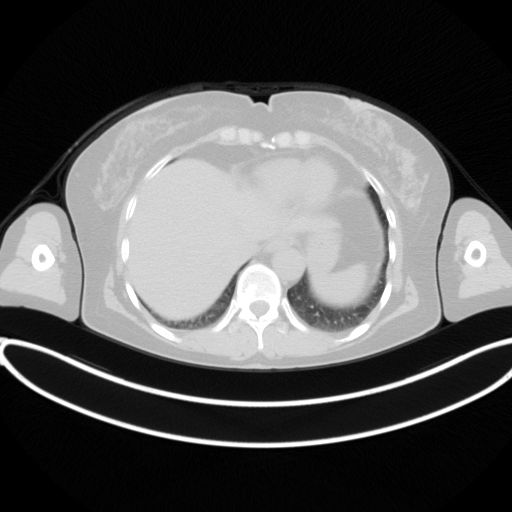
[im 170/227]
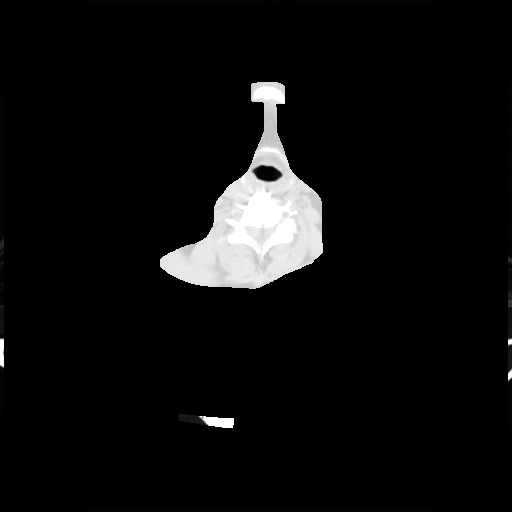
[im 227/227]
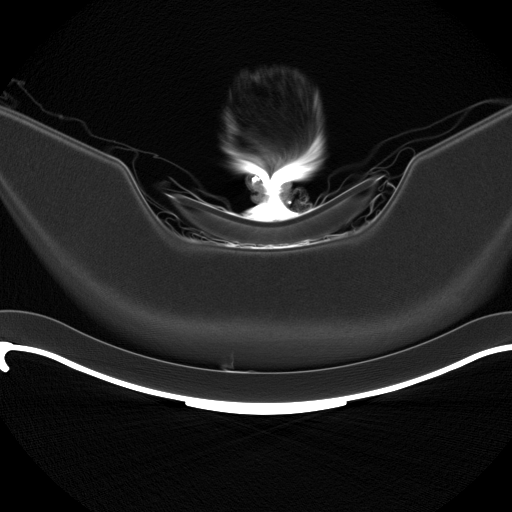

[Series 7: pet hn_sk_thigh nac · axial · 5.0mm · 4.07mm/px · z∈[-911,-7]mm · 5 of 227 slices shown]
[im 1/227]
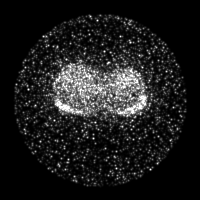
[im 57/227]
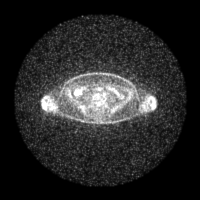
[im 114/227]
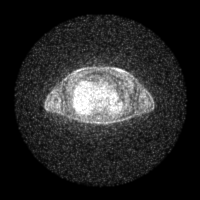
[im 170/227]
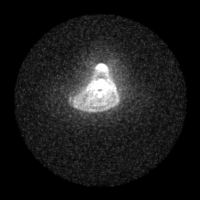
[im 227/227]
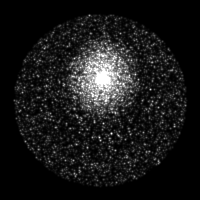

[Series 8: ct hn_sk_th 5.0 q b70f lung_bone · axial · 5.0mm · 0.54mm/px · z∈[-525,-237]mm · 2 of 73 slices shown]
[im 1/73  bone]
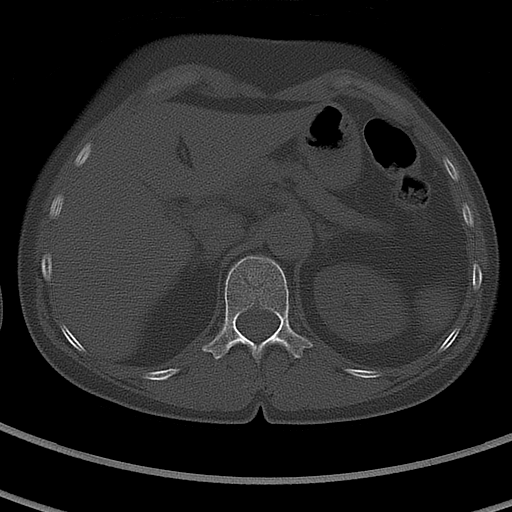
[im 73/73  bone]
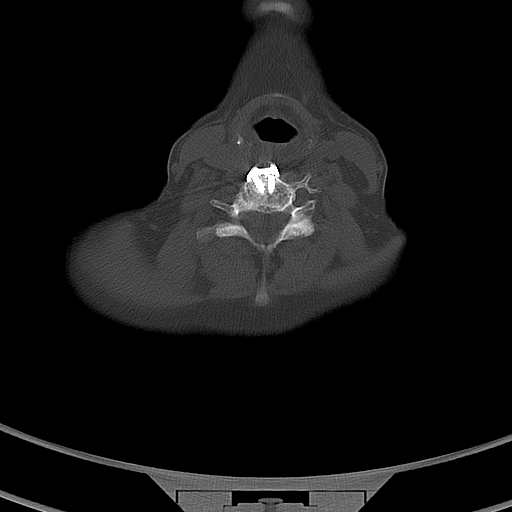

[Series 603: mip collection<mip range> · coronal · 1.88mm/px · 1 of 32 slices shown]
[im 1/32]
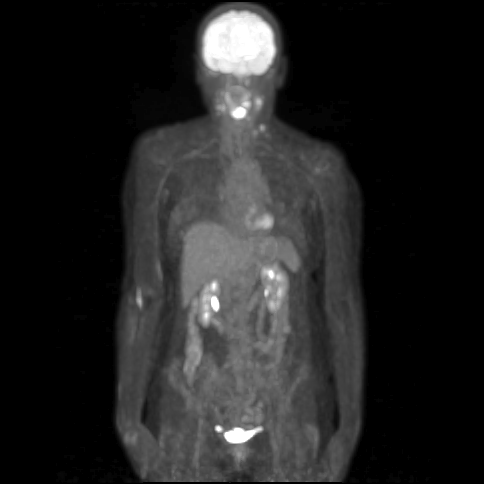

[Series 604: range-ct hn_sk_th 5.0 (id)<alpha range> · 2 of 65 slices shown (1 of 2)]
[im 1/65]
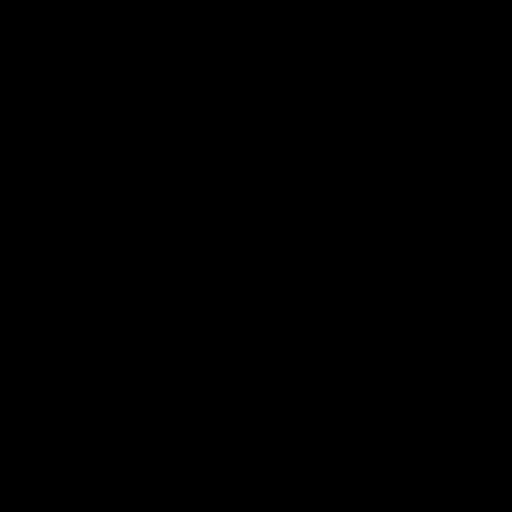
[im 65/65]
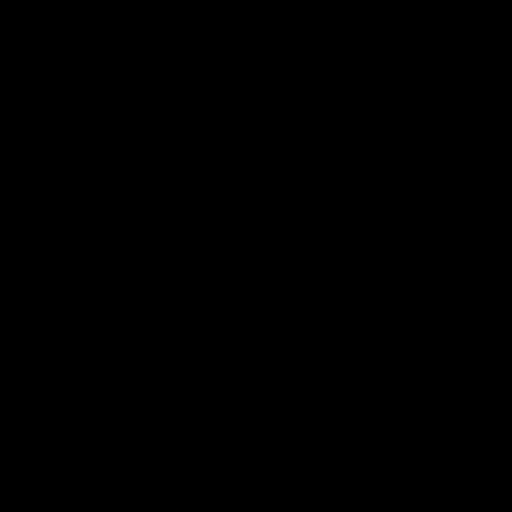

[Series 605: range-ct hn_sk_th 5.0 (id)<alpha range> · 5 of 210 slices shown (2 of 2)]
[im 1/210]
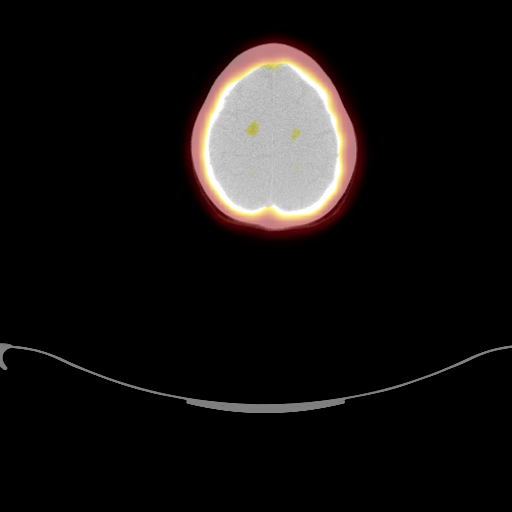
[im 53/210]
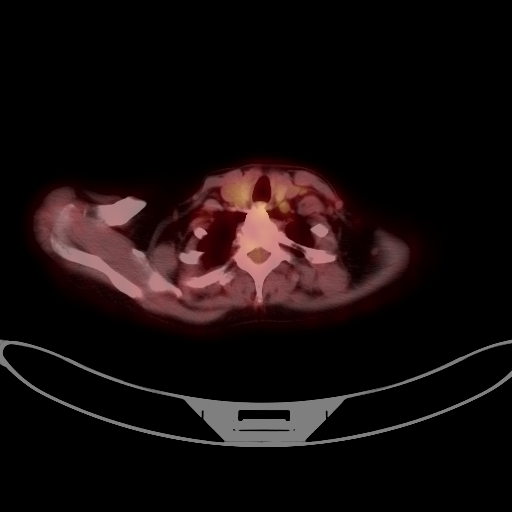
[im 105/210]
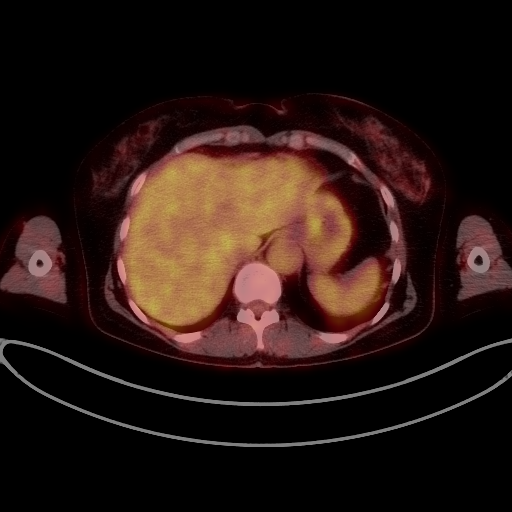
[im 157/210]
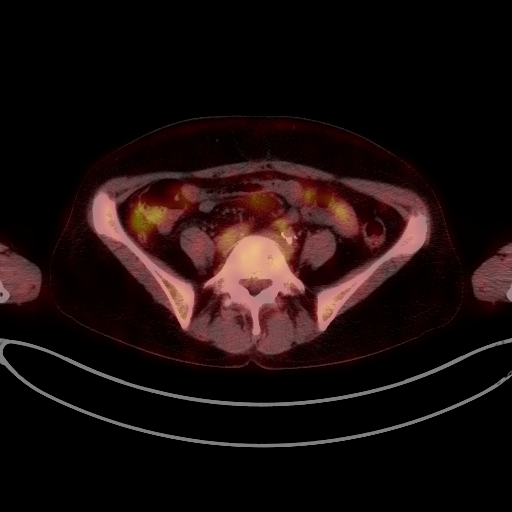
[im 210/210]
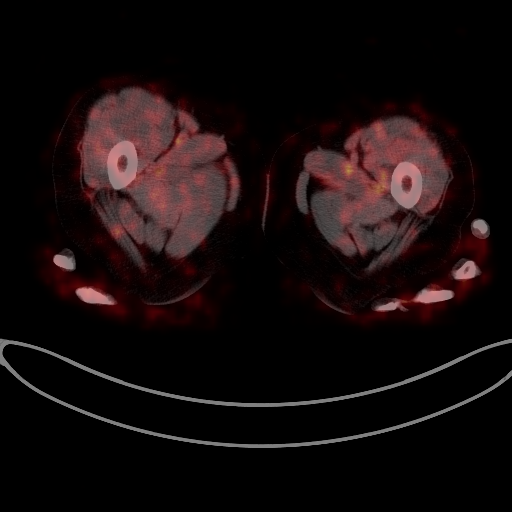

[Series 1032: results mm oncology reading · 0.79mm/px · 1 of 5 slices shown]
[im 1/5]
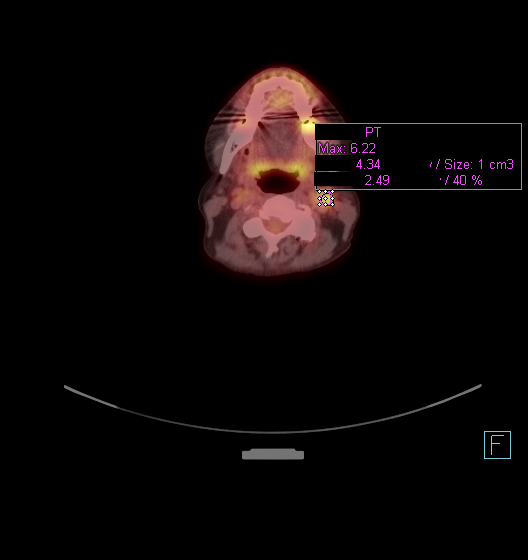

[25 of 25 positions shown; findings below may reference images not displayed]

FINDINGS: NECK

A hypopharyngeal mass involves the epiglottis and is better seen and
measured on diagnostic examination performed 04/14/2014. It has a
corresponding SUV max of 21.6. There are bilateral hypermetabolic
level 2 lymph nodes with index lymph node on the right measuring 8
mm in short axis (CT image 51) with a corresponding SUV max of 5.3.
Left level 3 lymph nodes measure up to 7 mm in short axis (image 61)
with an SUV max of 4.6.

CT images show no acute findings. Paranasal sinuses and mastoid air
cells are clear.

CHEST

No hypermetabolic mediastinal, hilar or axillary lymph nodes. No
hypermetabolic pulmonary nodules.

CT images show normal heart size. No pericardial or pleural
effusion.

ABDOMEN/PELVIS

No abnormal hypermetabolism in the liver, adrenal glands, spleen or
pancreas. No hypermetabolic lymph nodes.

CT images to the liver, gallbladder, adrenal glands, kidneys,
spleen, pancreas, stomach and bowel to be grossly unremarkable.
Atherosclerotic calcification of the arterial vasculature without
abdominal aortic aneurysm. No free fluid.

SKELETON

No abnormal osseous hypermetabolism.
IMPRESSION: Hypermetabolic hypopharyngeal mass with hypermetabolic lymph nodes
in the neck bilaterally. No evidence of distant metastatic disease.

## 2016-01-30 ENCOUNTER — Telehealth: Payer: Self-pay | Admitting: Adult Health

## 2016-01-30 NOTE — Telephone Encounter (Signed)
I was able to speak with Stacey Carroll today and offered to reschedule her survivorship appt, which she did not come in for on 01/11/16.  She tells me that she did not know anything the appt.  She tells me that she is scheduled to see her ENT physician, Stacey Carroll, in early 02/2016.  She had recent neck surgery for disc issues/malfunctioning hardware from previous surgeries.  This surgery was done at [facility] in 11/2015.   She is continuing to struggle with depression, which she is being seen by someone in the community for. She is continuing to struggle with eating breads and is frustrated by how long it takes her to eat.  She has talked with Stacey Carroll in the past and does not feel that she needs additional evaluation from dietitian at this time.    Since she is seeing [name] in 02/2016, I will make arrangements for her to see Stacey Carroll in 05/2016 and forgo her appt with me.  I will make Dr. Isidore Carroll aware and I am happy to see her at an appropriate interval after her subsequent f/u with Stacey Carroll and/or [name].   Mike Craze, NP [place] [phone]

## 2016-02-01 ENCOUNTER — Telehealth: Payer: Self-pay | Admitting: *Deleted

## 2016-02-01 NOTE — Telephone Encounter (Signed)
MAILED FU APPT. CARD

## 2016-02-28 ENCOUNTER — Other Ambulatory Visit: Payer: Self-pay | Admitting: Otolaryngology

## 2016-02-28 DIAGNOSIS — R1310 Dysphagia, unspecified: Secondary | ICD-10-CM

## 2016-03-05 ENCOUNTER — Ambulatory Visit
Admission: RE | Admit: 2016-03-05 | Discharge: 2016-03-05 | Disposition: A | Discharge status: Home / Self Care | Payer: BLUE CROSS/BLUE SHIELD | Source: Ambulatory Visit | Attending: Otolaryngology | Admitting: Otolaryngology

## 2016-03-05 DIAGNOSIS — R1310 Dysphagia, unspecified: Secondary | ICD-10-CM

## 2016-05-06 ENCOUNTER — Encounter: Payer: Self-pay | Admitting: Radiation Oncology

## 2016-05-06 NOTE — Progress Notes (Signed)
Ms. Partridge is here for follow up of radiation completed 07/18/14 to her Vallecula, epiglottis, and bilateral neck.    Pain issues, if any:  Using a feeding tube?:  Weight changes, if any:  Swallowing issues, if any:  Smoking or chewing tobacco?  Using fluoride trays daily?  Last ENT visit was on:  Dr. Janace Hoard 04/09/16 Other notable issues, if any:

## 2016-05-13 ENCOUNTER — Ambulatory Visit
Admission: RE | Admit: 2016-05-13 | Discharge: 2016-05-13 | Disposition: A | Discharge status: Home / Self Care | Payer: BLUE CROSS/BLUE SHIELD | Source: Ambulatory Visit | Attending: Radiation Oncology | Admitting: Radiation Oncology

## 2016-05-13 HISTORY — DX: Reserved for concepts with insufficient information to code with codable children: IMO0002

## 2016-05-13 HISTORY — DX: Reserved for inherently not codable concepts without codable children: IMO0001

## 2016-05-21 ENCOUNTER — Inpatient Hospital Stay
Admission: RE | Admit: 2016-05-21 | Payer: BLUE CROSS/BLUE SHIELD | Source: Ambulatory Visit | Admitting: Radiation Oncology

## 2016-05-23 ENCOUNTER — Ambulatory Visit: Payer: BLUE CROSS/BLUE SHIELD | Admitting: Radiation Oncology

## 2016-06-05 NOTE — Progress Notes (Signed)
  Stacey Carroll is here for follow up of radiation completed 07/18/14 to her Vallecula, epiglottis, and bilateral neck.  Pain issues, if any: She denies  Using a feeding tube?: No Weight changes, if any:  Wt Readings from Last 3 Encounters:  06/11/16 124 lb 1.6 oz (56.3 kg)  09/07/15 104 lb 1.6 oz (47.2 kg)  05/25/15 98 lb 8 oz (44.7 kg)   Swallowing issues, if any: She is unable to eat meats, bread. She is eating softer foods and chewing her foods well. She tells me she is mostly eating ice cream and chocolate milk. I encouraged her to eat foods that have more nutrients in it, like yogurt, banana, ensure.  Smoking or chewing tobacco? She is smoking "once in a while". She tells me she started back because of her depression.  Using fluoride trays daily? No Last ENT visit was on: Dr. Janace Hoard 04/09/16. She has another apt with Dr. Amedeo Plenty Sept 9 to have her esophagus stretched.  Other notable issues, if any:  She reports improvement in her chronic neck pain after having surgery this year.   BP 124/87   Pulse (!) 112   Temp 97.8 F (36.6 C)   Ht 5\' 5"  (1.651 m)   Wt 124 lb 1.6 oz (56.3 kg)   SpO2 95% Comment: room air  BMI 20.65 kg/m

## 2016-06-11 ENCOUNTER — Encounter: Payer: Self-pay | Admitting: Radiation Oncology

## 2016-06-11 ENCOUNTER — Ambulatory Visit
Admission: RE | Admit: 2016-06-11 | Discharge: 2016-06-11 | Disposition: A | Discharge status: Home / Self Care | Payer: BLUE CROSS/BLUE SHIELD | Source: Ambulatory Visit | Attending: Radiation Oncology | Admitting: Radiation Oncology

## 2016-06-11 VITALS — BP 124/87 | HR 112 | Temp 97.8°F | Ht 65.0 in | Wt 124.1 lb

## 2016-06-11 DIAGNOSIS — C100 Malignant neoplasm of vallecula: Secondary | ICD-10-CM | POA: Insufficient documentation

## 2016-06-11 DIAGNOSIS — C109 Malignant neoplasm of oropharynx, unspecified: Secondary | ICD-10-CM

## 2016-06-11 MED ORDER — LARYNGOSCOPY SOLUTION RAD-ONC
15.0000 mL | Freq: Once | TOPICAL | Status: AC
  Administered 2016-06-11: 15 mL via TOPICAL
  Filled 2016-06-11: qty 15

## 2016-06-11 NOTE — Progress Notes (Signed)
Radiation Oncology         [phone] ________________________________  Name: Stacey Carroll MRN: [id number]  Date: 06/11/2016  DOB: 1961-07-12  Follow-Up Visit Note  CC: Jonathon Bellows, MD  Ruby Cola, MD  Diagnosis and Prior Radiotherapy:       ICD-9-CM ICD-10-CM   1. 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 The patient had a CT of the spine on 08/09/15 showing no signs of metastasis.   Swallowing issues: she is unable to eat meats, bread. She is eating softer foods, and chewing her foods well. She tells me she is mostyly eating ice cream and chocolate milk. She was encouraged to eat foods with more nutrients in it like banana, yogurt, and ensure Smoking or chewing tobacco? "once in a while due to depression" Using fluoride trays daily? No.  She also reports she does not see a dentist. Other notable issues, if any: she reports improvement in her chronic neck pain after having surgery this year She reports her last ENT visit was with Dr. Janace Hoard on 04/09/16. She has another apt with Dr. Amedeo Plenty on Sept 9th to have her esophagus stretched She reports improvement in her chronic neck pain after having surgery this year.  ALLERGIES:  is  allergic to penicillins; vancomycin; and barium sulfate.  Meds: Current Outpatient Prescriptions  Medication Sig Dispense Refill  . antiseptic oral rinse (BIOTENE) LIQD 15 mLs by Mouth Rinse route as needed for dry mouth.    Marland Kitchen buPROPion (WELLBUTRIN SR) 150 MG 12 hr tablet Take 150 mg by mouth daily.    . diazepam (VALIUM) 10 MG tablet Take 5 mg by mouth at bedtime. Can take 1 tablet bid prn    . fexofenadine (ALLEGRA) 180 MG tablet Take 180 mg by mouth daily.    Marland Kitchen levothyroxine (LEVOTHROID) 50 MCG tablet Take 1 tablet (50 mcg total) by mouth daily before breakfast. (Patient taking differently: Take 75 mcg by mouth daily before breakfast. ) 30 tablet 5  . PARoxetine (PAXIL) 40 MG tablet Take 40 mg by mouth every morning.    . fentaNYL (DURAGESIC - DOSED MCG/HR) 50 MCG/HR Place 1 patch (50 mcg total) onto the skin every 3 (three) days. (Patient not taking: Reported on 06/11/2016) 10 patch 0  . fluticasone (FLONASE) 50 MCG/ACT nasal spray 1 spray by Each Nare route two (2) times a day as needed for rhinitis.    . fluticasone (FLOVENT HFA) 44 MCG/ACT inhaler Inhale into the lungs.    . miconazole (MICATIN) 2 % cream Apply TID to rash over abdomen for 2 weeks. (Patient not taking: Reported on 09/07/2015) 28 g 2  . sodium fluoride (FLUORISHIELD) 1.1 % GEL dental gel Instill one drop of fluoride per  tooth space of fluoride tray. Place over teeth for 5 minutes. Remove. Spit out excess. Repeat nightly. (Patient not taking: Reported on 09/07/2015) 120 mL prn  . traZODone (DESYREL) 50 MG tablet Take 50 mg by mouth at bedtime. Take 1-2 tablets at bedtime     No current facility-administered medications for this encounter.     Physical Findings: The patient is in no acute distress. Patient is alert and oriented.  height is 5\' 5"  (1.651 m) and weight is 124 lb 1.6 oz (56.3 kg). Her temperature is 97.8 F (36.6 C). Her blood pressure is 124/87 and her pulse is 112 (abnormal). Her oxygen saturation is 95%.    HEENT: No lesions in oral cavity or oropharynx Neck : no masses Heart: regular in rate and rhythm, no murmurs Chest: clear to auscultation bilaterally  PROCEDURE NOTE: After obtaining verbal consent and anesthetizing the nasal cavity with topical lidocaine and phenylephrine, the flexible endoscope was introduced and passed through the nasal cavity. Left base of the epiglottis appears to have a tissue deficit/irregularity, but no obvious evidence of tumor. No other  lesions appreciated in the pharynx/vallecula . No other lesions appreciated in the upper larynx. I did not navigate the laryngoscope more distally, as the positioning of the epiglottis  made this difficult. Exam is consistent with previous visit.   Lab Findings: Lab Results  Component Value Date   WBC 5.3 05/23/2015   HGB 13.3 05/23/2015   HCT 39.6 05/23/2015   MCV 93.3 05/23/2015   PLT 253 05/23/2015    CMP     Component Value Date/Time   NA 140 05/25/2015 1457   K 5.0 05/25/2015 1457   CL 100 05/23/2014 0430   CO2 31 (H) 05/25/2015 1457   GLUCOSE 70 05/25/2015 1457   BUN 18.6 05/25/2015 1457   CREATININE 0.9 05/25/2015 1457   CALCIUM 9.8 05/25/2015 1457   PROT 6.9 05/25/2015 1457   ALBUMIN 3.8 05/25/2015 1457   AST 21 05/25/2015 1457   ALT 23 05/25/2015 1457   ALKPHOS 102 05/25/2015 1457   BILITOT <0.20 05/25/2015 1457   GFRNONAA >90 05/23/2014 0430   GFRAA >90 05/23/2014 0430    Lab Results  Component Value Date   TSH 6.341 (H) 05/23/2015   No results found.   Impression/Plan:   NED  I will arrange for her to see Mike Craze of survivorship in May 2018. She can continue to see her on a yearly basis and see me back prn. I will arrange for her to be seen in [facility] for nutritional support (doing much better), SLP for dysphagia (she has epiglottic damage from her prior tumor and also possible esophageal stenosis), social work for emotional needs, and physical therapy for rehabilitation and chronic  pain.  She has no evidence of disease.   I advised her to call Dr. Enrique Sack if she needs refills on fluoride and advised for regular follow ups with dentistry.   The patient has resumed smoking, I once again counseled her on cessation.   TSH has been followed by PCP; continue per PCP.    Eppie Gibson, MD  This document serves as a record of services personally performed by Eppie Gibson, MD. It was created on her behalf by Bethann Humble, a trained medical scribe. The creation of this record is based on the scribe's personal observations and the provider's statements to them. This document has been checked and approved by the attending provider.

## 2016-07-08 ENCOUNTER — Telehealth: Payer: Self-pay | Admitting: *Deleted

## 2016-07-08 NOTE — Telephone Encounter (Signed)
Oncology Nurse Navigator Documentation  Spoke with [name], confirmed her availability to attend next Tuesday morning's H&N MDC to see Nutrition, SLP, PT and SW.  I explained registration procedure, asked her to arrive to Radiation Waiting by 0800.  She voiced understanding.  Stacey Orem, RN, BSN, Stacey Carroll at [place] [phone]

## 2016-07-13 ENCOUNTER — Encounter (HOSPITAL_COMMUNITY): Payer: Self-pay | Admitting: *Deleted

## 2016-07-13 ENCOUNTER — Emergency Department (HOSPITAL_COMMUNITY): Payer: BLUE CROSS/BLUE SHIELD

## 2016-07-13 ENCOUNTER — Inpatient Hospital Stay (HOSPITAL_COMMUNITY)
Admission: EM | Admit: 2016-07-13 | Discharge: 2016-07-16 | DRG: 156 | Disposition: A | Discharge status: Home / Self Care | Payer: BLUE CROSS/BLUE SHIELD | Attending: Internal Medicine | Admitting: Internal Medicine

## 2016-07-13 DIAGNOSIS — J392 Other diseases of pharynx: Secondary | ICD-10-CM | POA: Diagnosis present

## 2016-07-13 DIAGNOSIS — J384 Edema of larynx: Secondary | ICD-10-CM | POA: Diagnosis present

## 2016-07-13 DIAGNOSIS — Z8249 Family history of ischemic heart disease and other diseases of the circulatory system: Secondary | ICD-10-CM

## 2016-07-13 DIAGNOSIS — Z880 Allergy status to penicillin: Secondary | ICD-10-CM | POA: Diagnosis not present

## 2016-07-13 DIAGNOSIS — R1314 Dysphagia, pharyngoesophageal phase: Secondary | ICD-10-CM | POA: Diagnosis not present

## 2016-07-13 DIAGNOSIS — Z825 Family history of asthma and other chronic lower respiratory diseases: Secondary | ICD-10-CM

## 2016-07-13 DIAGNOSIS — T380X5A Adverse effect of glucocorticoids and synthetic analogues, initial encounter: Secondary | ICD-10-CM | POA: Diagnosis present

## 2016-07-13 DIAGNOSIS — Z923 Personal history of irradiation: Secondary | ICD-10-CM

## 2016-07-13 DIAGNOSIS — F329 Major depressive disorder, single episode, unspecified: Secondary | ICD-10-CM | POA: Diagnosis present

## 2016-07-13 DIAGNOSIS — Z8521 Personal history of malignant neoplasm of larynx: Secondary | ICD-10-CM | POA: Diagnosis not present

## 2016-07-13 DIAGNOSIS — Z9221 Personal history of antineoplastic chemotherapy: Secondary | ICD-10-CM

## 2016-07-13 DIAGNOSIS — E039 Hypothyroidism, unspecified: Secondary | ICD-10-CM | POA: Diagnosis present

## 2016-07-13 DIAGNOSIS — K219 Gastro-esophageal reflux disease without esophagitis: Secondary | ICD-10-CM | POA: Diagnosis present

## 2016-07-13 DIAGNOSIS — Z79899 Other long term (current) drug therapy: Secondary | ICD-10-CM

## 2016-07-13 DIAGNOSIS — Z808 Family history of malignant neoplasm of other organs or systems: Secondary | ICD-10-CM

## 2016-07-13 DIAGNOSIS — D72829 Elevated white blood cell count, unspecified: Secondary | ICD-10-CM | POA: Diagnosis present

## 2016-07-13 DIAGNOSIS — F3289 Other specified depressive episodes: Secondary | ICD-10-CM | POA: Diagnosis not present

## 2016-07-13 DIAGNOSIS — Z87891 Personal history of nicotine dependence: Secondary | ICD-10-CM

## 2016-07-13 DIAGNOSIS — R1310 Dysphagia, unspecified: Secondary | ICD-10-CM

## 2016-07-13 DIAGNOSIS — F339 Major depressive disorder, recurrent, unspecified: Secondary | ICD-10-CM | POA: Diagnosis not present

## 2016-07-13 DIAGNOSIS — R1313 Dysphagia, pharyngeal phase: Secondary | ICD-10-CM | POA: Diagnosis present

## 2016-07-13 DIAGNOSIS — F32A Depression, unspecified: Secondary | ICD-10-CM | POA: Diagnosis present

## 2016-07-13 DIAGNOSIS — E7800 Pure hypercholesterolemia, unspecified: Secondary | ICD-10-CM | POA: Diagnosis present

## 2016-07-13 DIAGNOSIS — C329 Malignant neoplasm of larynx, unspecified: Secondary | ICD-10-CM | POA: Diagnosis not present

## 2016-07-13 DIAGNOSIS — I10 Essential (primary) hypertension: Secondary | ICD-10-CM | POA: Diagnosis present

## 2016-07-13 LAB — CBC WITH DIFFERENTIAL/PLATELET
BASOS ABS: 0.1 10*3/uL (ref 0.0–0.1)
BASOS PCT: 1 %
EOS PCT: 1 %
Eosinophils Absolute: 0.1 10*3/uL (ref 0.0–0.7)
HCT: 43 % (ref 36.0–46.0)
Hemoglobin: 14.4 g/dL (ref 12.0–15.0)
Lymphocytes Relative: 10 %
Lymphs Abs: 1 10*3/uL (ref 0.7–4.0)
MCH: 29.8 pg (ref 26.0–34.0)
MCHC: 33.5 g/dL (ref 30.0–36.0)
MCV: 89 fL (ref 78.0–100.0)
MONO ABS: 1.2 10*3/uL — AB (ref 0.1–1.0)
Monocytes Relative: 11 %
Neutro Abs: 8.4 10*3/uL — ABNORMAL HIGH (ref 1.7–7.7)
Neutrophils Relative %: 77 %
Platelets: 343 10*3/uL (ref 150–400)
RBC: 4.83 MIL/uL (ref 3.87–5.11)
RDW: 16.1 % — AB (ref 11.5–15.5)
WBC: 10.8 10*3/uL — ABNORMAL HIGH (ref 4.0–10.5)

## 2016-07-13 LAB — BASIC METABOLIC PANEL
ANION GAP: 11 (ref 5–15)
BUN: 9 mg/dL (ref 6–20)
CALCIUM: 9.9 mg/dL (ref 8.9–10.3)
CO2: 20 mmol/L — ABNORMAL LOW (ref 22–32)
CREATININE: 1.1 mg/dL — AB (ref 0.44–1.00)
Chloride: 105 mmol/L (ref 101–111)
GFR, EST NON AFRICAN AMERICAN: 55 mL/min — AB (ref >60)
Glucose, Bld: 99 mg/dL (ref 65–99)
Potassium: 4 mmol/L (ref 3.5–5.1)
Sodium: 136 mmol/L (ref 135–145)

## 2016-07-13 LAB — MRSA PCR SCREENING: MRSA BY PCR: NEGATIVE

## 2016-07-13 LAB — TSH: TSH: 2.896 u[IU]/mL (ref 0.350–4.500)

## 2016-07-13 MED ORDER — LEVOTHYROXINE SODIUM 100 MCG IV SOLR
50.0000 ug | Freq: Every day | INTRAVENOUS | Status: DC
  Administered 2016-07-14: 50 ug via INTRAVENOUS
  Filled 2016-07-13 (×2): qty 5

## 2016-07-13 MED ORDER — CLINDAMYCIN PHOSPHATE 600 MG/50ML IV SOLN
600.0000 mg | Freq: Three times a day (TID) | INTRAVENOUS | Status: DC
  Administered 2016-07-13 – 2016-07-16 (×9): 600 mg via INTRAVENOUS
  Filled 2016-07-13 (×10): qty 50

## 2016-07-13 MED ORDER — MORPHINE SULFATE (PF) 2 MG/ML IV SOLN
1.0000 mg | INTRAVENOUS | Status: DC | PRN
  Administered 2016-07-14 – 2016-07-15 (×5): 1 mg via INTRAVENOUS
  Filled 2016-07-13 (×6): qty 1

## 2016-07-13 MED ORDER — LORAZEPAM 2 MG/ML IJ SOLN
0.5000 mg | Freq: Once | INTRAMUSCULAR | Status: AC
  Administered 2016-07-13: 0.5 mg via INTRAVENOUS
  Filled 2016-07-13: qty 1

## 2016-07-13 MED ORDER — FAMOTIDINE IN NACL 20-0.9 MG/50ML-% IV SOLN
20.0000 mg | Freq: Two times a day (BID) | INTRAVENOUS | Status: DC
  Administered 2016-07-13 – 2016-07-16 (×6): 20 mg via INTRAVENOUS
  Filled 2016-07-13 (×6): qty 50

## 2016-07-13 MED ORDER — LORAZEPAM 2 MG/ML IJ SOLN
0.5000 mg | Freq: Four times a day (QID) | INTRAMUSCULAR | Status: DC | PRN
  Administered 2016-07-15: 0.5 mg via INTRAVENOUS
  Filled 2016-07-13: qty 1

## 2016-07-13 MED ORDER — IOPAMIDOL (ISOVUE-300) INJECTION 61%
INTRAVENOUS | Status: AC
  Administered 2016-07-13: 75 mL
  Filled 2016-07-13: qty 75

## 2016-07-13 MED ORDER — DEXTROSE-NACL 5-0.9 % IV SOLN
INTRAVENOUS | Status: DC
  Administered 2016-07-13 – 2016-07-15 (×4): via INTRAVENOUS

## 2016-07-13 MED ORDER — DEXAMETHASONE SODIUM PHOSPHATE 10 MG/ML IJ SOLN
5.0000 mg | Freq: Four times a day (QID) | INTRAMUSCULAR | Status: DC
  Administered 2016-07-13 – 2016-07-15 (×6): 5 mg via INTRAVENOUS
  Filled 2016-07-13 (×7): qty 0.5

## 2016-07-13 MED ORDER — DEXTROSE 5 % IV SOLN
2.0000 g | INTRAVENOUS | Status: DC
  Administered 2016-07-13 – 2016-07-15 (×3): 2 g via INTRAVENOUS
  Filled 2016-07-13 (×5): qty 2

## 2016-07-13 MED ORDER — SODIUM CHLORIDE 0.9 % IV SOLN
Freq: Once | INTRAVENOUS | Status: AC
  Administered 2016-07-13: 18:00:00 via INTRAVENOUS

## 2016-07-13 MED ORDER — ENOXAPARIN SODIUM 40 MG/0.4ML ~~LOC~~ SOLN
40.0000 mg | SUBCUTANEOUS | Status: DC
  Administered 2016-07-13 – 2016-07-15 (×3): 40 mg via SUBCUTANEOUS
  Filled 2016-07-13 (×3): qty 0.4

## 2016-07-13 MED ORDER — ALBUTEROL SULFATE (2.5 MG/3ML) 0.083% IN NEBU: 2.5000 mg | INHALATION_SOLUTION | Freq: Four times a day (QID) | RESPIRATORY_TRACT | Status: DC | PRN

## 2016-07-13 MED ORDER — LIDOCAINE VISCOUS 2 % MT SOLN
15.0000 mL | Freq: Once | OROMUCOSAL | Status: AC
  Administered 2016-07-13: 15 mL via OROMUCOSAL
  Filled 2016-07-13: qty 15

## 2016-07-13 MED ORDER — DEXAMETHASONE SODIUM PHOSPHATE 10 MG/ML IJ SOLN
10.0000 mg | Freq: Once | INTRAMUSCULAR | Status: AC
Start: 2016-07-13 — End: 2016-07-13
  Administered 2016-07-13: 10 mg via INTRAVENOUS
  Filled 2016-07-13: qty 1

## 2016-07-13 NOTE — ED Provider Notes (Signed)
[facility] DEPT Provider Note   CSN: [id number] Arrival date & time: 07/13/16  1416     History   Chief Complaint Chief Complaint  Patient presents with  . Shortness of Breath    HPI Stacey Carroll is a 55 y.o. female.  The history is provided by the patient.  Shortness of Breath  This is a new problem. The average episode lasts 2 days. The problem occurs continuously.The current episode started 2 days ago. The problem has been gradually worsening. Associated symptoms include sore throat (with feeling of fullness) and cough. Pertinent negatives include no fever, no wheezing, no chest pain, no vomiting and no abdominal pain. It is unknown what precipitated the problem. She has tried nothing for the symptoms. 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    Past Medical History:  Diagnosis Date  . Anxiety   . Arthritis   . Asthma   . Cancer ([place])   . Cough   . Depression   . Dyslipidemia   . Dysphagia 04/27/2014  . Endometriosis   . Headache(784.0)   . Hypercholesterolemia   . Hypertension    not taking meds  . Nausea alone 05/31/2014  . PONV (postoperative nausea and vomiting)   . Radiation 05/24/14- 07/18/14   Vallecula, Epiglottis, and bilateral neck.   . S/P radiation therapy 05/24/2014-07/18/2014   7000  cGy/ Vallecula/neck  . Scoliosis   . Throat pain 04/19/2014  . Visual disturbance 03/11/2013    Patient Active Problem List   Diagnosis Date Noted  . Bilateral hearing loss 01/17/2015  . Cough 06/26/2014  . Preventive measure 06/14/2014  . Anemia in neoplastic disease 06/14/2014  . Nausea without vomiting 05/31/2014  . S/P percutaneous endoscopic gastrostomy (PEG) tube placement ([place]) 05/24/2014  . Tracheostomy status ([place]) 05/24/2014  . Protein-calorie malnutrition, severe ([place]) 05/19/2014  . Dysphagia 04/27/2014  . 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 Throat pain 04/19/2014  . 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 ABDOMINAL HYSTERECTOMY  1987  . CERVICAL LAMINECTOMY  2007 (?), 2011  . GASTROSTOMY N/A 05/18/2014   Procedure: GASTROSTOMY/PLACEMENT OF G-TUBE;  Surgeon: Rolm Bookbinder, MD;  Location: [facility];  Service: General;  Laterality: N/A;  . LARYNGOSCOPY AND ESOPHAGOSCOPY N/A 04/14/2014   Procedure: DIRECT LARYNGOSCOPY WITH BIOPSY  AND ESOPHAGOSCOPY;  Surgeon: Ruby Cola, MD;  Location: [facility];  Service: ENT;  Laterality: N/A;  . PORTACATH PLACEMENT N/A 05/18/2014   Procedure: INSERTION PORT-A-CATH;  Surgeon: Rolm Bookbinder, MD;  Location: [facility];  Service: General;  Laterality: N/A;  . SHOULDER SURGERY Left   . TRACHEOSTOMY TUBE PLACEMENT N/A 05/18/2014   Procedure: TRACHEOSTOMY;  Surgeon: Melissa Montane, MD;  Location: [facility];  Service: ENT;  Laterality: N/A;    OB History    No data available       Home Medications    Prior to Admission medications   Medication Sig Start Date End Date Taking? Authorizing Provider  buPROPion (WELLBUTRIN XL) 150 MG 24 hr tablet Take 150 mg by mouth daily.   Yes Historical Provider, MD  diazepam (VALIUM) 10 MG tablet Take 10 mg by mouth 2 (two) times daily as needed for anxiety or sleep.    Yes Historical Provider, MD  fexofenadine (ALLEGRA) 180 MG tablet Take 180 mg by mouth daily.   Yes Historical Provider, MD  fluticasone (FLONASE) 50 MCG/ACT nasal  spray Use 2 sprays in each nostril daily as needed for allergies   Yes Historical Provider, MD  fluticasone (FLOVENT HFA) 44 MCG/ACT inhaler Inhale 2 puffs into the lungs as needed (for shortness of breath).    Yes Historical Provider, MD  lamoTRIgine (LAMICTAL) 25 MG tablet Take 25 mg by mouth 2 (two) times daily.   Yes Historical Provider, MD  levothyroxine (LEVOTHROID) 50 MCG tablet Take 1 tablet (50 mcg total) by mouth daily before breakfast. Patient taking differently: Take 75  mcg by mouth daily before breakfast.  11/18/14  Yes Eppie Gibson, MD  omeprazole (PRILOSEC) 40 MG capsule Take 40 mg by mouth 2 (two) times daily before a meal.    Yes Historical Provider, MD  PARoxetine (PAXIL) 30 MG tablet Take 30 mg by mouth 2 (two) times daily.    Yes Historical Provider, MD  fentaNYL (DURAGESIC - DOSED MCG/HR) 50 MCG/HR Place 1 patch (50 mcg total) onto the skin every 3 (three) days. Patient not taking: Reported on 07/13/2016 09/18/14   Heath Lark, MD  miconazole (MICATIN) 2 % cream Apply TID to rash over abdomen for 2 weeks. Patient not taking: Reported on 07/13/2016 05/18/15   Eppie Gibson, MD  sodium fluoride (FLUORISHIELD) 1.1 % GEL dental gel Instill one drop of fluoride per tooth space of fluoride tray. Place over teeth for 5 minutes. Remove. Spit out excess. Repeat nightly. Patient not taking: Reported on 07/13/2016 05/02/14   Lenn Cal, DDS    Family History Family History  Problem Relation Age of Onset  . Emphysema Mother   . Cancer Mother     throat ca  . Cancer Father     prostate ca  . Heart disease Father     Social History Social History  Substance Use Topics  . Smoking status: Former Smoker    Packs/day: 0.50    Years: 39.00    Types: Cigarettes  . Smokeless tobacco: Never Used  . Alcohol use Yes     Comment: occasional     Allergies   Penicillins; Vancomycin; and Barium sulfate   Review of Systems Review of Systems  Constitutional: Negative for fever.  HENT: Positive for sore throat (with feeling of fullness).   Respiratory: Positive for cough and shortness of breath. Negative for wheezing.   Cardiovascular: Negative for chest pain.  Gastrointestinal: Negative for abdominal pain and vomiting.       Dysphagia, ongoing with worsening  Psychiatric/Behavioral: Positive for agitation.  All other systems reviewed and are negative.    Physical Exam Updated Vital Signs BP 141/93   Pulse 91   Temp 97.3 F (36.3 C) (Oral)   Resp  19   Ht 5\' 5"  (1.651 m)   Wt 124 lb (56.2 kg)   SpO2 96%   BMI 20.63 kg/m   Physical Exam  Constitutional: She is oriented to person, place, and time. She appears well-developed and well-nourished. She appears distressed.  HENT:  Head: Normocephalic.  Nose: Nose normal.  Eyes: Conjunctivae are normal.  Neck: No tracheal deviation present.  Cardiovascular: Normal rate and regular rhythm.   Pulmonary/Chest: No accessory muscle usage or stridor (with difficulty on phonation and fullness of neck). Tachypnea noted.  Anxious patient with coarse transmitted upper airway noises, no stridor, no decreased air movement. Improved following interventions with persistent airway noises  Abdominal: Soft. Normal appearance and bowel sounds are normal. She exhibits no distension.  Neurological: She is alert and oriented to person, place, and time.  Skin:  Skin is warm and dry.  Psychiatric: Her mood appears anxious.     ED Treatments / Results  Labs (all labs ordered are listed, but only abnormal results are displayed) Labs Reviewed  CBC WITH DIFFERENTIAL/PLATELET - Abnormal; Notable for the following:       Result Value   WBC 10.8 (*)    RDW 16.1 (*)    Neutro Abs 8.4 (*)    Monocytes Absolute 1.2 (*)    All other components within normal limits  BASIC METABOLIC PANEL - Abnormal; Notable for the following:    CO2 20 (*)    Creatinine, Ser 1.10 (*)    GFR calc non Af Amer 55 (*)    All other components within normal limits    EKG  EKG Interpretation None       Radiology Ct Soft Tissue Neck W Contrast  Result Date: 07/13/2016 CLINICAL DATA:  Initial evaluation for acute cough, shortness of breath, swelling of neck with hoarseness. Prior history of throat cancer. EXAM: CT NECK WITH CONTRAST TECHNIQUE: Multidetector CT imaging of the neck was performed using the standard protocol following the bolus administration of intravenous contrast. CONTRAST:  51mL ISOVUE-300 IOPAMIDOL  (ISOVUE-300) INJECTION 61% COMPARISON:  None. FINDINGS: Pharynx and larynx: Oral cavity within normal limits without acute inflammatory changes. Sublingual space within normal limits. No acute abnormality about the dentition. Nasopharynx within normal limits. Previously seen mass centered at the vallecula is no longer clearly visualized. Circumferential mucosal edema seen throughout the oropharynx, extending inferiorly into the hypopharynx and supraglottic larynx to the level of the true cords. Piriform sinuses are largely effaced. Involvement of the retropharyngeal soft tissues which for swollen and edematous in appearance without frank retropharyngeal abscess or collection. The epiglottis is somewhat swollen and edematous. True cords themselves are fairly symmetric and within normal limits. Hazy inflammatory stranding extends into the parapharyngeal spaces, greater on the right. Mild hazy inflammatory stranding also extends into the submandibular spaces, also slightly worse on the right. Findings suggestive of acute pharyngitis/ supra blood tightness. No abscess or drainable fluid collection. Supraglottic airways attenuated and narrowed measuring 6 mm at its most narrow point. Layering secretions present within the hypopharynx. Salivary glands: Parotid glands within normal limits. Submandibular glands mildly atrophic, like related to post treatment changes. Thyroid: Thyroid within normal limits. Lymph nodes: No pathologically enlarged lymph nodes identified within the neck. Vascular: Normal intravascular enhancement seen throughout the neck. Vascular calcifications present within the aortic arch and about the carotid bifurcations. Limited intracranial: Visualized portions of the brain are within normal limits. Visualized orbits: Visualized globes and orbits are unremarkable. Mastoids and visualized paranasal sinuses: Scattered mucosal thickening within the right sphenoid sinus, likely chronic. Paranasal sinuses  are otherwise clear. Mastoid air cells are clear. Middle ear cavities are well pneumatized. Skeleton: Patient is status post cervical ACDF extending from C4 through T1. 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 No acute osseous abnormality. No worrisome lytic or blastic osseous lesions. Upper chest: Visualized mediastinum within normal limits. Visualized lungs are clear. Other: No other significant finding. IMPRESSION: 1. Findings consistent with acute pharyngitis/ supraglottitis as above. Secondary mass effect on the supraglottic airway which is narrowed and attenuated, measuring 6 mm at its most narrow point. No abscess or drainable collection identified. Findings may be infectious in nature or related to angioedema. Post treatment/radiation changes changes could also be considered in the correct clinical setting. Continued close clinical observation and follow-up is recommended. 2. Nonvisualization of previously identified mass centered  at the vallecula. No adenopathy identified within the neck. Critical Value/emergent results were called by telephone at the time of interpretation on 07/13/2016 at 4:45 pm to Dr. Leo Grosser , who verbally acknowledged these results. Electronically Signed   By: Jeannine Boga M.D.   On: 07/13/2016 16:58    Procedures Procedures (including critical care time)  Medications Ordered in ED Medications  0.9 %  sodium chloride infusion (not administered)  LORazepam (ATIVAN) injection 0.5 mg (0.5 mg Intravenous Given 07/13/16 1507)  dexamethasone (DECADRON) injection 10 mg (10 mg Intravenous Given 07/13/16 1633)  iopamidol (ISOVUE-300) 61 % injection (75 mLs  Contrast Given 07/13/16 1613)     Initial Impression / Assessment and Plan / ED Course  I have reviewed the triage vital signs and the nursing notes.  Pertinent labs & imaging results that were available during my care of the patient were reviewed by me and considered in my medical decision making (see  chart for details).  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 She arrives in moderate distress making course upper airway noises and having difficulty with vocalization and managing secretions. The patient was calm down and placed on some oxygen, reassured and had improvement of breathing. She is provided Ativan IV to help with anxiety related to her breathing. Given her history of radiation and feeling of fullness I'm concerned for airway edema especially in the setting of a recent cough. Decadron given IV prior to CT with contrast to evaluate the soft tissue of her neck.  Patient with significant supraglottic stenosis and edema concerning for impending airway compromise on CT. Currently she is resting comfortably and able to move air appropriately. I discussed the CT findings with radiology and consulted Dr. Erik Obey of ENT who agreed to see the patient in consultation, appreciate their input. Pt will remain NPO and be started on IVF for maintenance pending consultation.  Care transferred to Dr Venora Maples awaiting arrival of specialist.   Final Clinical Impressions(s) / ED Diagnoses   Final diagnoses:  Supraglottic edema  Narrow pharyngeal airway    New Prescriptions New Prescriptions   No medications on file     Leo Grosser, MD 07/13/16 1707

## 2016-07-13 NOTE — ED Notes (Signed)
Nurse attempted report X3

## 2016-07-13 NOTE — Consult Note (Signed)
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  Had a trach, removed in 2016.  Hx ant and post cervical fusion.  1/4 ppd smoker.    2 days ago, noted onset of progressive sore throat and gradually breathing difficulty.  Claims poor swallowing for longer interval of time. No hemoptysis. No neck swelling or tenderness. No fever. No recent URI, although son was ill last week.  On Omeprazole for known reflux.  On 75 mcg Synthroid daily.  No trauma to neck or throat.  No prior similar events.    PMH: Past Medical History:  Diagnosis Date  . Anxiety   . Arthritis   . Asthma   . Cancer ([place])   . Cough   . Depression   . Dyslipidemia   . Dysphagia 04/27/2014  . Endometriosis   . Headache(784.0)   . Hypercholesterolemia   . Hypertension    not taking meds  . Nausea alone 05/31/2014  . PONV (postoperative nausea and vomiting)   . Radiation 05/24/14- 07/18/14   Vallecula, Epiglottis, and bilateral neck.   . S/P radiation therapy 05/24/2014-07/18/2014   7000  cGy/ Vallecula/neck  . Scoliosis   . Throat pain 04/19/2014  . Visual disturbance 03/11/2013    Surg Hx: Past Surgical History:  Procedure Laterality Date  . ABDOMINAL HYSTERECTOMY  1987  . CERVICAL LAMINECTOMY  2007 (?), 2011  . GASTROSTOMY N/A 05/18/2014   Procedure: GASTROSTOMY/PLACEMENT OF G-TUBE;  Surgeon: Rolm Bookbinder, MD;  Location: [facility];  Service: General;  Laterality: N/A;  . LARYNGOSCOPY AND ESOPHAGOSCOPY N/A 04/14/2014   Procedure: DIRECT LARYNGOSCOPY WITH BIOPSY  AND ESOPHAGOSCOPY;  Surgeon: Ruby Cola, MD;  Location: [facility];  Service: ENT;  Laterality: N/A;  . PORTACATH PLACEMENT N/A 05/18/2014   Procedure: INSERTION PORT-A-CATH;  Surgeon: Rolm Bookbinder, MD;  Location: [facility];  Service: General;  Laterality: N/A;  . SHOULDER SURGERY Left   . TRACHEOSTOMY TUBE PLACEMENT N/A 05/18/2014   Procedure: TRACHEOSTOMY;   Surgeon: Melissa Montane, MD;  Location: [facility] OR;  Service: ENT;  Laterality: N/A;    FHx:   Family History  Problem Relation Age of Onset  . Emphysema Mother   . Cancer Mother     throat ca  . Cancer Father     prostate ca  . Heart disease Father    SocHx:  reports that she has quit smoking. Her smoking use included Cigarettes. She has a 19.50 pack-year smoking history. She has never used smokeless tobacco. She reports that she drinks alcohol. She reports that she does not use drugs.  ALLERGIES:  Allergies  Allergen Reactions  . Penicillins Anaphylaxis and Other (See Comments)    From childhood  . Vancomycin Itching  . Barium Sulfate Itching and Rash     (Not in a hospital admission)  Results for orders placed or performed during the hospital encounter of 07/13/16 (from the past 48 hour(s))  CBC with Differential     Status: Abnormal   Collection Time: 07/13/16  2:42 PM  Result Value Ref Range   WBC 10.8 (H) 4.0 - 10.5 K/uL   RBC 4.83 3.87 - 5.11 MIL/uL   Hemoglobin 14.4 12.0 - 15.0 g/dL   HCT 43.0 36.0 - 46.0 %   MCV 89.0 78.0 - 100.0 fL   MCH 29.8 26.0 - 34.0 pg   MCHC 33.5 30.0 - 36.0 g/dL  RDW 16.1 (H) 11.5 - 15.5 %   Platelets 343 150 - 400 K/uL   Neutrophils Relative % 77 %   Neutro Abs 8.4 (H) 1.7 - 7.7 K/uL   Lymphocytes Relative 10 %   Lymphs Abs 1.0 0.7 - 4.0 K/uL   Monocytes Relative 11 %   Monocytes Absolute 1.2 (H) 0.1 - 1.0 K/uL   Eosinophils Relative 1 %   Eosinophils Absolute 0.1 0.0 - 0.7 K/uL   Basophils Relative 1 %   Basophils Absolute 0.1 0.0 - 0.1 K/uL  Basic metabolic panel     Status: Abnormal   Collection Time: 07/13/16  2:42 PM  Result Value Ref Range   Sodium 136 135 - 145 mmol/L   Potassium 4.0 3.5 - 5.1 mmol/L   Chloride 105 101 - 111 mmol/L   CO2 20 (L) 22 - 32 mmol/L   Glucose, Bld 99 65 - 99 mg/dL   BUN 9 6 - 20 mg/dL   Creatinine, Ser 1.10 (H) 0.44 - 1.00 mg/dL   Calcium 9.9 8.9 - 10.3 mg/dL   GFR calc non Af Amer 55 (L) >60  mL/min   GFR calc Af Amer >60 >60 mL/min    Comment: (NOTE) The eGFR has been calculated using the CKD EPI equation. This calculation has not been validated in all clinical situations. eGFR's persistently <60 mL/min signify possible Chronic Kidney Disease.    Anion gap 11 5 - 15   Ct Soft Tissue Neck W Contrast  Result Date: 07/13/2016 CLINICAL DATA:  Initial evaluation for acute cough, shortness of breath, swelling of neck with hoarseness. Prior history of throat cancer. EXAM: CT NECK WITH CONTRAST TECHNIQUE: Multidetector CT imaging of the neck was performed using the standard protocol following the bolus administration of intravenous contrast. CONTRAST:  45m ISOVUE-300 IOPAMIDOL (ISOVUE-300) INJECTION 61% COMPARISON:  None. FINDINGS: Pharynx and larynx: Oral cavity within normal limits without acute inflammatory changes. Sublingual space within normal limits. No acute abnormality about the dentition. Nasopharynx within normal limits. Previously seen mass centered at the vallecula is no longer clearly visualized. Circumferential mucosal edema seen throughout the oropharynx, extending inferiorly into the hypopharynx and supraglottic larynx to the level of the true cords. Piriform sinuses are largely effaced. Involvement of the retropharyngeal soft tissues which for swollen and edematous in appearance without frank retropharyngeal abscess or collection. The epiglottis is somewhat swollen and edematous. True cords themselves are fairly symmetric and within normal limits. Hazy inflammatory stranding extends into the parapharyngeal spaces, greater on the right. Mild hazy inflammatory stranding also extends into the submandibular spaces, also slightly worse on the right. Findings suggestive of acute pharyngitis/ supra blood tightness. No abscess or drainable fluid collection. Supraglottic airways attenuated and narrowed measuring 6 mm at its most narrow point. Layering secretions present within the  hypopharynx. Salivary glands: Parotid glands within normal limits. Submandibular glands mildly atrophic, like related to post treatment changes. Thyroid: Thyroid within normal limits. Lymph nodes: No pathologically enlarged lymph nodes identified within the neck. Vascular: Normal intravascular enhancement seen throughout the neck. Vascular calcifications present within the aortic arch and about the carotid bifurcations. Limited intracranial: Visualized portions of the brain are within normal limits. Visualized orbits: Visualized globes and orbits are unremarkable. Mastoids and visualized paranasal sinuses: Scattered mucosal thickening within the right sphenoid sinus, likely chronic. Paranasal sinuses are otherwise clear. Mastoid air cells are clear. Middle ear cavities are well pneumatized. Skeleton: Patient is status post cervical ACDF extending from C4 through T1. Segmental anomaly with  fusion of C2-3 again noted. No acute osseous abnormality. No worrisome lytic or blastic osseous lesions. Upper chest: Visualized mediastinum within normal limits. Visualized lungs are clear. Other: No other significant finding. IMPRESSION: 1. Findings consistent with acute pharyngitis/ supraglottitis as above. Secondary mass effect on the supraglottic airway which is narrowed and attenuated, measuring 6 mm at its most narrow point. No abscess or drainable collection identified. Findings may be infectious in nature or related to angioedema. Post treatment/radiation changes changes could also be considered in the correct clinical setting. Continued close clinical observation and follow-up is recommended. 2. Nonvisualization of previously identified mass centered at the vallecula. No adenopathy identified within the neck. Critical Value/emergent results were called by telephone at the time of interpretation on 07/13/2016 at 4:45 pm to Dr. Leo Grosser , who verbally acknowledged these results. Electronically Signed   By: Jeannine Boga M.D.   On: 07/13/2016 16:58    ROS: gradual weight loss.  Blood pressure 144/96, pulse 89, temperature 97.3 F (36.3 C), temperature source Oral, resp. rate 21, height '5\' 5"'  (1.651 m), weight 56.2 kg (124 lb), SpO2 100 %.  PHYSICAL EXAM: Overall appearance: thin, hoarse, with occasional phonation.  No stridor or labor. Head:  NCAT Ears:  clear Nose:  clear Oral Cavity:  Teeth in fair repair.  Sl atrophic changes of palate c/w radiation Oral Pharynx/Hypopharynx/Larynx:  Per flexible laryngoscope, pharynx crowded.  Epiglottis distorted s/p radiation for valllecular tumor.  No visible residual tumor.  Vocal cords swollen but mobile.  Supraglottic swelling more significant than glottic swelling.   Neuro: grossly intact Neck:  Radiation fibrosis and lymophedema.  No nodes.  No tenderness  Studies Reviewed:  CT neck    Assessment/Plan Laryngeal/pharyngeal acute swelling.  Could be viral, bacterial. Doubt angioedema.  No evidence of recurrent cancer.  Plan:  Admit to stepdown unit.  IV Unasyn.  Recheck TSH.  Decadron q6h.   CBC in AM.  Sips/chips until more comfortable with po intake.    [name] 38/10/7709, 6:17 PM

## 2016-07-13 NOTE — ED Notes (Signed)
Attempted report to 2C.  

## 2016-07-13 NOTE — ED Notes (Signed)
Attempted report x2. Nurse unavailable at this moment.

## 2016-07-13 NOTE — ED Triage Notes (Signed)
Pt arrives from home via [facility]. Pt states she has been having SOB x1 hour and feels like her throat is closing. Upon arrival pt has an O2 sat of 100% on RA and WNL VS. Pt states she is having anxiety, given 2L O2 [place] and calmed down and went to sleep.

## 2016-07-13 NOTE — ED Notes (Signed)
Patient transported to CT 

## 2016-07-13 NOTE — H&P (Addendum)
History and Physical  Stacey Carroll [id number] DOB: 07/01/1961 DOA: 07/13/2016  PCP:  Jonathon Bellows, MD   Chief Complaint:  Dyspnea   History of Present Illness:  Patient is a 55 yo female with hx of laryngeal cancer s/p chemoradiation in 2015 who came with cc of dyspnea, worsening throat swelling for the past 2 days associated with dysphagia and tenderness in her neck anteriorly along with non productive cough and hoarseness for the past 2-3 days. She has no fever or chills. No chest pain. She has not had a BM in two days because she could not eat or drink. No N/V/D/abd pain/dysuria. No rash.   Review of Systems:  CONSTITUTIONAL:     No night sweats.  No fatigue.  No fever. No chills. Eyes:                            No visual changes.  No eye pain.  No eye discharge.   ENT:                              No epistaxis.  No sinus pain.  +sore throat.   No congestion. RESPIRATORY:           +cough.  No wheeze.  No hemoptysis.  +dyspnea CARDIOVASCULAR   :  No chest pains.  No palpitations. GASTROINTESTINAL:  No abdominal pain.  No nausea. No vomiting.  No diarrhea. +constipation.  No hematemesis.  No hematochezia.  No melena. GENITOURINARY:      No urgency.  No frequency.  No dysuria.  No hematuria.  No  obstructive symptoms.  No discharge.  No pain.  MUSCULOSKELETAL:  No musculoskeletal pain.  No joint swelling.  No arthritis. NEUROLOGICAL:        No confusion.  No weakness. No headache. No seizure. PSYCHIATRIC:             No depression. No anxiety. No suicidal ideation. SKIN:                             No rashes.  No lesions.  No wounds. ENDOCRINE:                No weight loss.  No polydipsia.  No polyuria.  No polyphagia. HEMATOLOGIC:           No purpura.  No petechiae.  No bleeding.  ALLERGIC                 : No pruritus.  No angioedema Other:  Past Medical and Surgical History:   Past Medical History:  Diagnosis Date  . Anxiety   . Arthritis   . Asthma   .  Cancer ([place])   . Cough   . Depression   . Dyslipidemia   . Dysphagia 04/27/2014  . Endometriosis   . Headache(784.0)   . Hypercholesterolemia   . Hypertension    not taking meds  . Nausea alone 05/31/2014  . PONV (postoperative nausea and vomiting)   . Radiation 05/24/14- 07/18/14   Vallecula, Epiglottis, and bilateral neck.   . S/P radiation therapy 05/24/2014-07/18/2014   7000  cGy/ Vallecula/neck  . Scoliosis   . Throat pain 04/19/2014  . Visual disturbance 03/11/2013   Past Surgical History:  Procedure Laterality Date  . ABDOMINAL HYSTERECTOMY  1987  . CERVICAL LAMINECTOMY  2007 (?), 2011  . GASTROSTOMY N/A 05/18/2014   Procedure: GASTROSTOMY/PLACEMENT OF G-TUBE;  Surgeon: Rolm Bookbinder, MD;  Location: [place];  Service: General;  Laterality: N/A;  . LARYNGOSCOPY AND ESOPHAGOSCOPY N/A 04/14/2014   Procedure: DIRECT LARYNGOSCOPY WITH BIOPSY  AND ESOPHAGOSCOPY;  Surgeon: Ruby Cola, MD;  Location: [place];  Service: ENT;  Laterality: N/A;  . PORTACATH PLACEMENT N/A 05/18/2014   Procedure: INSERTION PORT-A-CATH;  Surgeon: Rolm Bookbinder, MD;  Location: [place];  Service: General;  Laterality: N/A;  . SHOULDER SURGERY Left   . TRACHEOSTOMY TUBE PLACEMENT N/A 05/18/2014   Procedure: TRACHEOSTOMY;  Surgeon: Melissa Montane, MD;  Location: [place];  Service: ENT;  Laterality: N/A;    Social History:   reports that she has quit smoking. Her smoking use included Cigarettes. She has a 19.50 pack-year smoking history. She has never used smokeless tobacco. She reports that she drinks alcohol. She reports that she does not use drugs.    Allergies  Allergen Reactions  . Penicillins Anaphylaxis and Other (See Comments)    From childhood  . Vancomycin Itching  . Barium Sulfate Itching and Rash    Family History  Problem Relation Age of Onset  . Emphysema Mother   . Cancer Mother     throat ca  . Cancer Father     prostate ca  . Heart disease Father       Prior to Admission  medications   Medication Sig Start Date End Date Taking? Authorizing Provider  buPROPion (WELLBUTRIN XL) 150 MG 24 hr tablet Take 150 mg by mouth daily.   Yes Historical Provider, MD  diazepam (VALIUM) 10 MG tablet Take 10 mg by mouth 2 (two) times daily as needed for anxiety or sleep.    Yes Historical Provider, MD  fexofenadine (ALLEGRA) 180 MG tablet Take 180 mg by mouth daily.   Yes Historical Provider, MD  fluticasone (FLONASE) 50 MCG/ACT nasal spray Use 2 sprays in each nostril daily as needed for allergies   Yes Historical Provider, MD  fluticasone (FLOVENT HFA) 44 MCG/ACT inhaler Inhale 2 puffs into the lungs as needed (for shortness of breath).    Yes Historical Provider, MD  lamoTRIgine (LAMICTAL) 25 MG tablet Take 25 mg by mouth 2 (two) times daily.   Yes Historical Provider, MD  levothyroxine (LEVOTHROID) 50 MCG tablet Take 1 tablet (50 mcg total) by mouth daily before breakfast. Patient taking differently: Take 75 mcg by mouth daily before breakfast.  11/18/14  Yes Eppie Gibson, MD  omeprazole (PRILOSEC) 40 MG capsule Take 40 mg by mouth 2 (two) times daily before a meal.    Yes Historical Provider, MD  PARoxetine (PAXIL) 30 MG tablet Take 30 mg by mouth 2 (two) times daily.    Yes Historical Provider, MD  fentaNYL (DURAGESIC - DOSED MCG/HR) 50 MCG/HR Place 1 patch (50 mcg total) onto the skin every 3 (three) days. Patient not taking: Reported on 07/13/2016 09/18/14   Heath Lark, MD  miconazole (MICATIN) 2 % cream Apply TID to rash over abdomen for 2 weeks. Patient not taking: Reported on 07/13/2016 05/18/15   Eppie Gibson, MD  sodium fluoride (FLUORISHIELD) 1.1 % GEL dental gel Instill one drop of fluoride per tooth space of fluoride tray. Place over teeth for 5 minutes. Remove. Spit out excess. Repeat nightly. Patient not taking: Reported on 07/13/2016 05/02/14   Lenn Cal, DDS    Physical Exam: BP (!) 151/108   Pulse 87   Temp 97.3 F (  36.3 C) (Oral)   Resp 16   Ht 5\' 5"   (1.651 m)   Wt 56.2 kg (124 lb)   SpO2 98%   BMI 20.63 kg/m   GENERAL :   Alert and cooperative, and appears to be in mild acute distress. HEAD:           normocephalic. EYES:            PERRL, EOMI.  EARS:           hearing grossly intact. NOSE:           No nasal discharge. THROAT:     Oral cavity ; dry membranes  NECK:          Tender to palpation anteriorly  CARDIAC:    Normal S1 and S2. No gallop. No murmurs.  Vascular:     no peripheral edema.  LUNGS:       Noisy breathing w/o crackles/wheezing ABDOMEN: Positive bowel sounds. Soft, nondistended, nontender. No guarding or rebound.      MSK:           No joint erythema or tenderness.  EXT           : No significant deformity or joint abnormality. Neuro        : Alert, oriented to person, place, and time.                      CN II-XII intact.   SKIN:            No rash. No lesions. PSYCH:       No hallucination. Patient is not suicidal.          Labs on Admission:  Reviewed.   Radiological Exams on Admission: Ct Soft Tissue Neck W Contrast  Result Date: 07/13/2016 CLINICAL DATA:  Initial evaluation for acute cough, shortness of breath, swelling of neck with hoarseness. Prior history of throat cancer. EXAM: CT NECK WITH CONTRAST TECHNIQUE: Multidetector CT imaging of the neck was performed using the standard protocol following the bolus administration of intravenous contrast. CONTRAST:  77mL ISOVUE-300 IOPAMIDOL (ISOVUE-300) INJECTION 61% COMPARISON:  None. FINDINGS: Pharynx and larynx: Oral cavity within normal limits without acute inflammatory changes. Sublingual space within normal limits. No acute abnormality about the dentition. Nasopharynx within normal limits. Previously seen mass centered at the vallecula is no longer clearly visualized. Circumferential mucosal edema seen throughout the oropharynx, extending inferiorly into the hypopharynx and supraglottic larynx to the level of the true cords. Piriform sinuses are largely  effaced. Involvement of the retropharyngeal soft tissues which for swollen and edematous in appearance without frank retropharyngeal abscess or collection. The epiglottis is somewhat swollen and edematous. True cords themselves are fairly symmetric and within normal limits. Hazy inflammatory stranding extends into the parapharyngeal spaces, greater on the right. Mild hazy inflammatory stranding also extends into the submandibular spaces, also slightly worse on the right. Findings suggestive of acute pharyngitis/ supra blood tightness. No abscess or drainable fluid collection. Supraglottic airways attenuated and narrowed measuring 6 mm at its most narrow point. Layering secretions present within the hypopharynx. Salivary glands: Parotid glands within normal limits. Submandibular glands mildly atrophic, like related to post treatment changes. Thyroid: Thyroid within normal limits. Lymph nodes: No pathologically enlarged lymph nodes identified within the neck. Vascular: Normal intravascular enhancement seen throughout the neck. Vascular calcifications present within the aortic arch and about the carotid bifurcations. Limited intracranial: Visualized portions of the brain are within normal limits.  Visualized orbits: Visualized globes and orbits are unremarkable. Mastoids and visualized paranasal sinuses: Scattered mucosal thickening within the right sphenoid sinus, likely chronic. Paranasal sinuses are otherwise clear. Mastoid air cells are clear. Middle ear cavities are well pneumatized. Skeleton: Patient is status post cervical ACDF extending from C4 through T1. 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 No acute osseous abnormality. No worrisome lytic or blastic osseous lesions. Upper chest: Visualized mediastinum within normal limits. Visualized lungs are clear. Other: No other significant finding. IMPRESSION: 1. Findings consistent with acute pharyngitis/ supraglottitis as above. Secondary mass effect on  the supraglottic airway which is narrowed and attenuated, measuring 6 mm at its most narrow point. No abscess or drainable collection identified. Findings may be infectious in nature or related to angioedema. Post treatment/radiation changes changes could also be considered in the correct clinical setting. Continued close clinical observation and follow-up is recommended. 2. Nonvisualization of previously identified mass centered at the vallecula. No adenopathy identified within the neck. Critical Value/emergent results were called by telephone at the time of interpretation on 07/13/2016 at 4:45 pm to Dr. Leo Grosser , who verbally acknowledged these results. Electronically Signed   By: Jeannine Boga M.D.   On: 07/13/2016 16:58      Assessment/Plan  Laryngeal/pharyngeal edema: Likely due to infection per ENT  No abscess by CT scan Started on ceft/clinda ( allergic to PCN) and Dexamethasone per ENT physician recs IVF D5/NS NPO except ice chips Morphine prn pain  Oxygen therapy  MDD: hold PO meds. Ativan prn anxiety   hypothyroidism: switch synthroid to IV  Input & Output: na Lines & Tubes: PIV DVT prophylaxis:  Enoxaparin  GI prophylaxis: PPI Consultants: ENT Code Status: full Family Communication: none at bedside  Disposition Plan: Pam Drown M.D [facility]

## 2016-07-13 NOTE — ED Notes (Signed)
ENT at bedside

## 2016-07-14 DIAGNOSIS — E039 Hypothyroidism, unspecified: Secondary | ICD-10-CM

## 2016-07-14 DIAGNOSIS — F339 Major depressive disorder, recurrent, unspecified: Secondary | ICD-10-CM

## 2016-07-14 LAB — COMPREHENSIVE METABOLIC PANEL
ALT: 28 U/L (ref 14–54)
ANION GAP: 11 (ref 5–15)
AST: 25 U/L (ref 15–41)
Albumin: 3.4 g/dL — ABNORMAL LOW (ref 3.5–5.0)
Alkaline Phosphatase: 123 U/L (ref 38–126)
BUN: 11 mg/dL (ref 6–20)
CHLORIDE: 103 mmol/L (ref 101–111)
CO2: 22 mmol/L (ref 22–32)
Calcium: 9.4 mg/dL (ref 8.9–10.3)
Creatinine, Ser: 0.97 mg/dL (ref 0.44–1.00)
Glucose, Bld: 190 mg/dL — ABNORMAL HIGH (ref 65–99)
POTASSIUM: 4.3 mmol/L (ref 3.5–5.1)
Sodium: 136 mmol/L (ref 135–145)
Total Bilirubin: 0.3 mg/dL (ref 0.3–1.2)
Total Protein: 6.6 g/dL (ref 6.5–8.1)

## 2016-07-14 LAB — CBC WITH DIFFERENTIAL/PLATELET
Basophils Absolute: 0 10*3/uL (ref 0.0–0.1)
Basophils Relative: 0 %
EOS ABS: 0 10*3/uL (ref 0.0–0.7)
Eosinophils Relative: 0 %
HCT: 40 % (ref 36.0–46.0)
HEMOGLOBIN: 13.4 g/dL (ref 12.0–15.0)
LYMPHS ABS: 0.6 10*3/uL — AB (ref 0.7–4.0)
LYMPHS PCT: 6 %
MCH: 29.8 pg (ref 26.0–34.0)
MCHC: 33.5 g/dL (ref 30.0–36.0)
MCV: 89.1 fL (ref 78.0–100.0)
Monocytes Absolute: 0.1 10*3/uL (ref 0.1–1.0)
Monocytes Relative: 1 %
NEUTROS ABS: 9.3 10*3/uL — AB (ref 1.7–7.7)
NEUTROS PCT: 93 %
Platelets: 314 10*3/uL (ref 150–400)
RBC: 4.49 MIL/uL (ref 3.87–5.11)
RDW: 16.1 % — ABNORMAL HIGH (ref 11.5–15.5)
WBC: 10 10*3/uL (ref 4.0–10.5)

## 2016-07-14 MED ORDER — LEVOTHYROXINE SODIUM 100 MCG IV SOLR
37.5000 ug | Freq: Every day | INTRAVENOUS | Status: DC
  Administered 2016-07-15 – 2016-07-16 (×2): 37.5 ug via INTRAVENOUS
  Filled 2016-07-14 (×2): qty 5

## 2016-07-14 MED ORDER — ORAL CARE MOUTH RINSE
15.0000 mL | Freq: Two times a day (BID) | OROMUCOSAL | Status: DC
  Administered 2016-07-14 – 2016-07-16 (×5): 15 mL via OROMUCOSAL

## 2016-07-14 MED ORDER — INFLUENZA VAC SPLIT QUAD 0.5 ML IM SUSY
0.5000 mL | PREFILLED_SYRINGE | INTRAMUSCULAR | Status: DC
Start: 2016-07-15 — End: 2016-07-16
  Filled 2016-07-14: qty 0.5

## 2016-07-14 NOTE — Progress Notes (Signed)
07/14/2016 8:30 AM  Stacey Carroll [id number]  Hosp  Day 2    Temp:  [97.3 F (36.3 C)-97.8 F (36.6 C)] 97.5 F (36.4 C) (10/09 0802) Pulse Rate:  [81-108] 81 (10/09 0802) Resp:  [11-25] 15 (10/09 0802) BP: (122-177)/(87-112) 146/100 (10/09 0802) SpO2:  [91 %-100 %] 100 % (10/09 0700) Weight:  [56.2 kg (124 lb)-60.1 kg (132 lb 7.9 oz)] 60.1 kg (132 lb 7.9 oz) (10/08 2102),     Intake/Output Summary (Last 24 hours) at 07/14/16 0830 Last data filed at 07/14/16 0600  Gross per 24 hour  Intake           931.25 ml  Output              500 ml  Net           431.25 ml    Results for orders placed or performed during the hospital encounter of 07/13/16 (from the past 24 hour(s))  CBC with Differential     Status: Abnormal   Collection Time: 07/13/16  2:42 PM  Result Value Ref Range   WBC 10.8 (H) 4.0 - 10.5 K/uL   RBC 4.83 3.87 - 5.11 MIL/uL   Hemoglobin 14.4 12.0 - 15.0 g/dL   HCT 43.0 36.0 - 46.0 %   MCV 89.0 78.0 - 100.0 fL   MCH 29.8 26.0 - 34.0 pg   MCHC 33.5 30.0 - 36.0 g/dL   RDW 16.1 (H) 11.5 - 15.5 %   Platelets 343 150 - 400 K/uL   Neutrophils Relative % 77 %   Neutro Abs 8.4 (H) 1.7 - 7.7 K/uL   Lymphocytes Relative 10 %   Lymphs Abs 1.0 0.7 - 4.0 K/uL   Monocytes Relative 11 %   Monocytes Absolute 1.2 (H) 0.1 - 1.0 K/uL   Eosinophils Relative 1 %   Eosinophils Absolute 0.1 0.0 - 0.7 K/uL   Basophils Relative 1 %   Basophils Absolute 0.1 0.0 - 0.1 K/uL  Basic metabolic panel     Status: Abnormal   Collection Time: 07/13/16  2:42 PM  Result Value Ref Range   Sodium 136 135 - 145 mmol/L   Potassium 4.0 3.5 - 5.1 mmol/L   Chloride 105 101 - 111 mmol/L   CO2 20 (L) 22 - 32 mmol/L   Glucose, Bld 99 65 - 99 mg/dL   BUN 9 6 - 20 mg/dL   Creatinine, Ser 1.10 (H) 0.44 - 1.00 mg/dL   Calcium 9.9 8.9 - 10.3 mg/dL   GFR calc non Af Amer 55 (L) >60 mL/min   GFR calc Af Amer >60 >60 mL/min   Anion gap 11 5 - 15  TSH     Status: None   Collection Time: 07/13/16   7:01 PM  Result Value Ref Range   TSH 2.896 0.350 - 4.500 uIU/mL  MRSA PCR Screening     Status: None   Collection Time: 07/13/16  8:44 PM  Result Value Ref Range   MRSA by PCR NEGATIVE NEGATIVE  Comprehensive metabolic panel     Status: Abnormal   Collection Time: 07/14/16  2:39 AM  Result Value Ref Range   Sodium 136 135 - 145 mmol/L   Potassium 4.3 3.5 - 5.1 mmol/L   Chloride 103 101 - 111 mmol/L   CO2 22 22 - 32 mmol/L   Glucose, Bld 190 (H) 65 - 99 mg/dL   BUN 11 6 - 20 mg/dL   Creatinine, Ser 0.97 0.44 - 1.00 mg/dL  Calcium 9.4 8.9 - 10.3 mg/dL   Total Protein 6.6 6.5 - 8.1 g/dL   Albumin 3.4 (L) 3.5 - 5.0 g/dL   AST 25 15 - 41 U/L   ALT 28 14 - 54 U/L   Alkaline Phosphatase 123 38 - 126 U/L   Total Bilirubin 0.3 0.3 - 1.2 mg/dL   GFR calc non Af Amer >60 >60 mL/min   GFR calc Af Amer >60 >60 mL/min   Anion gap 11 5 - 15  CBC WITH DIFFERENTIAL     Status: Abnormal   Collection Time: 07/14/16  2:39 AM  Result Value Ref Range   WBC 10.0 4.0 - 10.5 K/uL   RBC 4.49 3.87 - 5.11 MIL/uL   Hemoglobin 13.4 12.0 - 15.0 g/dL   HCT 40.0 36.0 - 46.0 %   MCV 89.1 78.0 - 100.0 fL   MCH 29.8 26.0 - 34.0 pg   MCHC 33.5 30.0 - 36.0 g/dL   RDW 16.1 (H) 11.5 - 15.5 %   Platelets 314 150 - 400 K/uL   Neutrophils Relative % 93 %   Neutro Abs 9.3 (H) 1.7 - 7.7 K/uL   Lymphocytes Relative 6 %   Lymphs Abs 0.6 (L) 0.7 - 4.0 K/uL   Monocytes Relative 1 %   Monocytes Absolute 0.1 0.1 - 1.0 K/uL   Eosinophils Relative 0 %   Eosinophils Absolute 0.0 0.0 - 0.7 K/uL   Basophils Relative 0 %   Basophils Absolute 0.0 0.0 - 0.1 K/uL    SUBJECTIVE:  Voice sl better.  Still difficult to swallow. Had esophageal dilation roughly 4 weeks ago by GI.    OBJECTIVE:  Voice sl more phonatory.  No resp difficulty  IMPRESSION:  Acute pharyngeal/laryngeal swelling of uncertain etiology. Improved. Still significant dysphagia.  TSH OK.  PLAN:  Continue antibiotics.  When clinically improved, recheck  Dr. Janace Hoard 1 week.  May need repeat Ba swallow or re-eval with GI.    [name]

## 2016-07-14 NOTE — Plan of Care (Signed)
Problem: Nutrition: Goal: Adequate nutrition will be maintained Outcome: Progressing Discussed with the patient while she can only have ice chips with teach back displayed

## 2016-07-14 NOTE — Plan of Care (Signed)
Problem: Education: Goal: Knowledge of [facility] Education information/materials will improve Outcome: Progressing Discussed with patient about having something for gas pain in the stomach. Will see MAR or page MD with teach back displayed

## 2016-07-14 NOTE — Progress Notes (Signed)
PROGRESS NOTE        PATIENT DETAILS Name: Stacey Carroll Age: 55 y.o. Sex: female Date of Birth: 01/21/1961 Admit Date: 07/13/2016 Admitting Physician Gennaro Africa, MD [id number], Valla Leaver, MD  Brief Narrative: Patient is a 55 y.o. 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 She was evaluated by ENT, found to have acute laryngeal/pharyngeal swelling of unknown etiology. She was started on empiric antibiotics and steroids and admitted to the hospitalist service.  Subjective: Feels that her throat swelling is somewhat better. Hoarseness of voice continues.  Assessment/Plan: Acute pharyngeal/laryngeal swelling of uncertain etiology: Clinically improved, no stridor-seems comfortable. Continue empiric antibiotics and steroids. ENT following.  Dysphagia: Not sure if this is secondary to above, she does have a history of dysphagia and apparently sees gastroenterology for esophageal dilatation. For now will continue antibiotics and steroids and see if she improves. If no further improvement, may need GI evaluation  Hypothyroidism: Continue IV Synthroid, transitioned to oral route when oral intake more stable.  GERD: Continue PPI  History of depression: Hold antidepressants-till oral intake more stable.  DVT Prophylaxis: Prophylactic Lovenox   Code Status: Full code   Family Communication: None at bedside  Disposition Plan: Remain inpatient-home once more stable  Antimicrobial agents: See below  Procedures: None  CONSULTS:  ENT  Time spent: 25 minutes-Greater than 50% of this time was spent in counseling, explanation of diagnosis, planning of further management, and coordination of care.  MEDICATIONS: Anti-infectives    Start     Dose/Rate Route Frequency  Ordered Stop   07/13/16 2000  cefTRIAXone (ROCEPHIN) 2 g in dextrose 5 % 50 mL IVPB     2 g 100 mL/hr over 30 Minutes Intravenous Every 24 hours 07/13/16 1922     07/13/16 2000  clindamycin (CLEOCIN) IVPB 600 mg     600 mg 100 mL/hr over 30 Minutes Intravenous Every 8 hours 07/13/16 1922        Scheduled Meds: . cefTRIAXone (ROCEPHIN)  IV  2 g Intravenous Q24H  . clindamycin (CLEOCIN) IV  600 mg Intravenous Q8H  . dexamethasone  5 mg Intravenous Q6H  . enoxaparin (LOVENOX) injection  40 mg Subcutaneous Q24H  . famotidine (PEPCID) IV  20 mg Intravenous Q12H  . [START ON 07/15/2016] Influenza vac split quadrivalent PF  0.5 mL Intramuscular Tomorrow-1000  . levothyroxine  50 mcg Intravenous Daily  . mouth rinse  15 mL Mouth Rinse BID   Continuous Infusions: . dextrose 5 % and 0.9% NaCl 75 mL/hr at 07/13/16 2103   PRN Meds:.albuterol, LORazepam, morphine injection   PHYSICAL EXAM: Vital signs: Vitals:   07/14/16 0500 07/14/16 0600 07/14/16 0700 07/14/16 0802  BP: (!) 137/94 (!) 149/98 (!) 139/94 (!) 146/100  Pulse: 86 83 85 81  Resp: 15 15 18 15   Temp:    97.5 F (36.4 C)  TempSrc:    Axillary  SpO2: 100% 99% 100%   Weight:      Height:       Filed Weights   07/13/16 1426 07/13/16 2102  Weight: 56.2 kg (124 lb) 60.1 kg (132 lb 7.9 oz)   Body mass index is 22.05 kg/m.   General appearance :  Awake, alert, not in any distress. Speech Clear. Not toxic Looking. No stridor Eyes:, pupils equally reactive to light and accomodation,no scleral icterus.Pink conjunctiva HEENT: Atraumatic and Normocephalic Neck: supple, no JVD. No cervical lymphadenopathy. No thyromegaly Resp:Good air entry bilaterally, no added sounds  CVS: S1 S2 regular, no murmurs.  GI: Bowel sounds present, Non tender and not distended with no gaurding, rigidity or rebound.No organomegaly Extremities: B/L Lower Ext shows no edema, both legs are warm to touch Neurology:  speech clear,Non focal, sensation is  grossly intact. Psychiatric: Normal judgment and insight. Alert and oriented x 3. Normal mood. Musculoskeletal:gait appears to be normal.No digital cyanosis Skin:No Rash, warm and dry Wounds:N/A  I have personally reviewed following labs and imaging studies  LABORATORY DATA: CBC:  Recent Labs Lab 07/13/16 1442 07/14/16 0239  WBC 10.8* 10.0  NEUTROABS 8.4* 9.3*  HGB 14.4 13.4  HCT 43.0 40.0  MCV 89.0 89.1  PLT 343 [id number]    Basic Metabolic Panel:  Recent Labs Lab 07/13/16 1442 07/14/16 0239  NA 136 136  K 4.0 4.3  CL 105 103  CO2 20* 22  GLUCOSE 99 190*  BUN 9 11  CREATININE 1.10* 0.97  CALCIUM 9.9 9.4    GFR: Estimated Creatinine Clearance: 58.3 mL/min (by C-G formula based on SCr of 0.97 mg/dL).  Liver Function Tests:  Recent Labs Lab 07/14/16 0239  AST 25  ALT 28  ALKPHOS 123  BILITOT 0.3  PROT 6.6  ALBUMIN 3.4*   No results for input(s): LIPASE, AMYLASE in the last 168 hours. No results for input(s): AMMONIA in the last 168 hours.  Coagulation Profile: No results for input(s): INR, PROTIME in the last 168 hours.  Cardiac Enzymes: No results for input(s): CKTOTAL, CKMB, CKMBINDEX, TROPONINI in the last 168 hours.  BNP (last 3 results) No results for input(s): PROBNP in the last 8760 hours.  HbA1C: No results for input(s): HGBA1C in the last 72 hours.  CBG: No results for input(s): GLUCAP in the last 168 hours.  Lipid Profile: No results for input(s): CHOL, HDL, LDLCALC, TRIG, CHOLHDL, LDLDIRECT in the last 72 hours.  Thyroid Function Tests:  Recent Labs  07/13/16 1901  TSH 2.896    Anemia Panel: No results for input(s): VITAMINB12, FOLATE, FERRITIN, TIBC, IRON, RETICCTPCT in the last 72 hours.  Urine analysis:    Component Value Date/Time   COLORURINE YELLOW 06/01/2014 [place] 06/01/2014 0313   LABSPEC 1.014 06/01/2014 0313   PHURINE 8.0 06/01/2014 0313   GLUCOSEU NEGATIVE 06/01/2014 0313   HGBUR NEGATIVE  06/01/2014 0313   BILIRUBINUR NEGATIVE 06/01/2014 0313   KETONESUR NEGATIVE 06/01/2014 0313   PROTEINUR NEGATIVE 06/01/2014 0313   UROBILINOGEN 0.2 06/01/2014 0313   NITRITE NEGATIVE 06/01/2014 0313   LEUKOCYTESUR NEGATIVE 06/01/2014 0313    Sepsis Labs: Lactic Acid, Venous No results found for: LATICACIDVEN  MICROBIOLOGY: Recent Results (from the past 240 hour(s))  MRSA PCR Screening     Status: None   Collection Time: 07/13/16  8:44 PM  Result Value Ref Range Status   MRSA by PCR NEGATIVE NEGATIVE Final    Comment:        The GeneXpert MRSA Assay (FDA approved for NASAL specimens only), is one component of a comprehensive MRSA colonization surveillance program. It is not intended to diagnose MRSA infection nor to guide or monitor treatment for MRSA infections.     RADIOLOGY STUDIES/RESULTS: Ct Soft Tissue Neck W Contrast  Result Date: 07/13/2016 CLINICAL DATA:  Initial evaluation for acute cough, shortness of breath, swelling of neck with hoarseness. Prior history of throat cancer. EXAM: CT NECK WITH CONTRAST TECHNIQUE: Multidetector CT imaging of the neck was performed using the standard protocol following the bolus administration of intravenous contrast. CONTRAST:  75mL ISOVUE-300 IOPAMIDOL (ISOVUE-300) INJECTION 61% COMPARISON:  None. FINDINGS: Pharynx and larynx: Oral cavity within normal limits without acute inflammatory changes. Sublingual space within normal limits. No acute abnormality about the dentition. Nasopharynx within normal limits. Previously seen mass centered at the vallecula is no longer clearly visualized. Circumferential mucosal edema seen throughout the oropharynx, extending inferiorly into the hypopharynx and supraglottic larynx to the level of the true cords. Piriform sinuses are largely effaced. Involvement of the retropharyngeal soft tissues which for swollen and edematous in appearance without frank retropharyngeal abscess or collection. The epiglottis  is somewhat swollen and edematous. True cords themselves are fairly symmetric and within normal limits. Hazy inflammatory stranding extends into the parapharyngeal spaces, greater on the right. Mild hazy inflammatory stranding also extends into the submandibular spaces, also slightly worse on the right. Findings suggestive of acute pharyngitis/ supra blood tightness. No abscess or drainable fluid collection. Supraglottic airways attenuated and narrowed measuring 6 mm at its most narrow point. Layering secretions present within the hypopharynx. Salivary glands: Parotid glands within normal limits. Submandibular glands mildly atrophic, like related to post treatment changes. Thyroid: Thyroid within normal limits. Lymph nodes: No pathologically enlarged lymph nodes identified within the neck. Vascular: Normal intravascular enhancement seen throughout the neck. Vascular calcifications present within the aortic arch and about the carotid bifurcations. Limited intracranial: Visualized portions of the brain are within normal limits. Visualized orbits: Visualized globes and orbits are unremarkable. Mastoids and visualized paranasal sinuses: Scattered mucosal thickening within the right sphenoid sinus, likely chronic. Paranasal sinuses are otherwise clear. Mastoid air cells are clear. Middle ear cavities are well pneumatized. Skeleton: Patient is status post cervical ACDF extending from C4 through T1. 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 No acute osseous abnormality. No worrisome lytic or blastic osseous lesions. Upper chest: Visualized mediastinum within normal limits. Visualized lungs are clear. Other: No other significant finding. IMPRESSION: 1. Findings consistent with acute pharyngitis/ supraglottitis as above. Secondary mass effect on the supraglottic airway which is narrowed and attenuated, measuring 6 mm at its most narrow point. No abscess or drainable collection identified. Findings may be infectious  in nature or related to angioedema. Post treatment/radiation changes changes could also be considered in the correct clinical setting. Continued close clinical observation and follow-up is recommended. 2. Nonvisualization of previously identified mass centered at the vallecula. No adenopathy identified within the neck. Critical Value/emergent results were called by telephone at the time of interpretation on 07/13/2016 at 4:45 pm to Dr. Leo Grosser , who verbally acknowledged these results. Electronically Signed   By: Jeannine Boga M.D.   On: 07/13/2016 16:58     LOS: 1 day   Oren Binet, MD  [facility] Pager:[phone]  If 7PM-7AM, please contact night-coverage [email or web address] Password [email or web address] 07/14/2016, 10:03 AM

## 2016-07-15 ENCOUNTER — Encounter: Payer: Self-pay | Admitting: Nutrition

## 2016-07-15 ENCOUNTER — Inpatient Hospital Stay (HOSPITAL_COMMUNITY): Payer: BLUE CROSS/BLUE SHIELD

## 2016-07-15 ENCOUNTER — Ambulatory Visit: Payer: BLUE CROSS/BLUE SHIELD | Admitting: Physical Therapy

## 2016-07-15 ENCOUNTER — Ambulatory Visit: Payer: BLUE CROSS/BLUE SHIELD

## 2016-07-15 DIAGNOSIS — R1310 Dysphagia, unspecified: Secondary | ICD-10-CM

## 2016-07-15 DIAGNOSIS — C329 Malignant neoplasm of larynx, unspecified: Secondary | ICD-10-CM

## 2016-07-15 LAB — BASIC METABOLIC PANEL
Anion gap: 10 (ref 5–15)
BUN: 11 mg/dL (ref 6–20)
CHLORIDE: 103 mmol/L (ref 101–111)
CO2: 24 mmol/L (ref 22–32)
Calcium: 9.3 mg/dL (ref 8.9–10.3)
Creatinine, Ser: 0.79 mg/dL (ref 0.44–1.00)
GFR calc Af Amer: >60 mL/min (ref >60)
GFR calc non Af Amer: >60 mL/min (ref >60)
Glucose, Bld: 141 mg/dL — ABNORMAL HIGH (ref 65–99)
POTASSIUM: 3.9 mmol/L (ref 3.5–5.1)
SODIUM: 137 mmol/L (ref 135–145)

## 2016-07-15 LAB — CBC
HEMATOCRIT: 36.3 % (ref 36.0–46.0)
HEMOGLOBIN: 12.2 g/dL (ref 12.0–15.0)
MCH: 29.8 pg (ref 26.0–34.0)
MCHC: 33.6 g/dL (ref 30.0–36.0)
MCV: 88.8 fL (ref 78.0–100.0)
Platelets: 316 10*3/uL (ref 150–400)
RBC: 4.09 MIL/uL (ref 3.87–5.11)
RDW: 16.3 % — ABNORMAL HIGH (ref 11.5–15.5)
WBC: 16.5 10*3/uL — ABNORMAL HIGH (ref 4.0–10.5)

## 2016-07-15 MED ORDER — PHENOL 1.4 % MT LIQD
1.0000 | OROMUCOSAL | Status: DC | PRN
  Filled 2016-07-15: qty 177

## 2016-07-15 MED ORDER — RESOURCE THICKENUP CLEAR PO POWD
Freq: Once | ORAL | Status: AC
  Administered 2016-07-15: 17:00:00 via ORAL
  Filled 2016-07-15: qty 125

## 2016-07-15 MED ORDER — HYDRALAZINE HCL 20 MG/ML IJ SOLN
10.0000 mg | Freq: Once | INTRAMUSCULAR | Status: DC
Start: 2016-07-15 — End: 2016-07-16
  Filled 2016-07-15: qty 1

## 2016-07-15 MED ORDER — HYDRALAZINE HCL 20 MG/ML IJ SOLN
5.0000 mg | Freq: Once | INTRAMUSCULAR | Status: AC
  Administered 2016-07-15: 5 mg via INTRAVENOUS
  Filled 2016-07-15: qty 1

## 2016-07-15 MED ORDER — DEXAMETHASONE SODIUM PHOSPHATE 10 MG/ML IJ SOLN
4.0000 mg | Freq: Three times a day (TID) | INTRAMUSCULAR | Status: DC
Start: 2016-07-15 — End: 2016-07-16
  Administered 2016-07-15 – 2016-07-16 (×3): 4 mg via INTRAVENOUS
  Filled 2016-07-15 (×5): qty 0.4

## 2016-07-15 NOTE — Evaluation (Signed)
Clinical/Bedside Swallow Evaluation Patient Details  Name: Stacey Carroll MRN: [id number] Date of Birth: [birth year]  Today's Date: 07/15/2016 Time: SLP Start Time (ACUTE ONLY): [id number] SLP Stop Time (ACUTE ONLY): 0952 SLP Time Calculation (min) (ACUTE ONLY): 35 min  Past Medical History:  Past Medical History:  Diagnosis Date  . Anxiety   . Arthritis   . Asthma   . Cancer ([place])   . Cough   . Depression   . Dyslipidemia   . Dysphagia 04/27/2014  . Endometriosis   . Headache(784.0)   . Hypercholesterolemia   . Hypertension    not taking meds  . Nausea alone 05/31/2014  . PONV (postoperative nausea and vomiting)   . Radiation 05/24/14- 07/18/14   Vallecula, Epiglottis, and bilateral neck.   . S/P radiation therapy 05/24/2014-07/18/2014   7000  cGy/ Vallecula/neck  . Scoliosis   . Throat pain 04/19/2014  . Visual disturbance 03/11/2013   Past Surgical History:  Past Surgical History:  Procedure Laterality Date  . ABDOMINAL HYSTERECTOMY  1987  . CERVICAL LAMINECTOMY  2007 (?), 2011  . GASTROSTOMY N/A 05/18/2014   Procedure: GASTROSTOMY/PLACEMENT OF G-TUBE;  Surgeon: Rolm Bookbinder, MD;  Location: [facility];  Service: General;  Laterality: N/A;  . LARYNGOSCOPY AND ESOPHAGOSCOPY N/A 04/14/2014   Procedure: DIRECT LARYNGOSCOPY WITH BIOPSY  AND ESOPHAGOSCOPY;  Surgeon: Ruby Cola, MD;  Location: [facility];  Service: ENT;  Laterality: N/A;  . PORTACATH PLACEMENT N/A 05/18/2014   Procedure: INSERTION PORT-A-CATH;  Surgeon: Rolm Bookbinder, MD;  Location: [facility];  Service: General;  Laterality: N/A;  . SHOULDER SURGERY Left   . 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  MBS 04/2014 revealed severe pharyngeal dysphagia -  prior to rad tx.  S/P chemoradiation in 2015.  Admitted with cc of dyspnea, worsening throat swelling for the past 2 days associated with dysphagia and tenderness in her neck anteriorly along with non productive cough and hoarseness for the past 2-3 days.  Seen by ENT 10/9  - had esophageal dilation four weeks prior; dx acute pharyngeal/laryngeal swelling of uncertain etiology.  Hx of difficulty swallowing dry solids, breads, meats since radiation and has worsened last several months.       Assessment / Plan / Recommendation Clinical Impression  Pt presents with acute-on-chronic pharyngeal dysphagia that has worsened in the last several months but became severe with recent pharyngeal edema.  Pt demonstrates multiple sub-swallows per bolus,  immediate cough with consumption of ice chips and thin liquids, concerning for aspiration as well as PO transition through pharynx/cervical esophagus.  Recommend proceeding with MBS today; scheduled for 1:00.  D/W pt, RN.     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  MBS 04/2014 revealed severe pharyngeal dysphagia - prior to rad tx.  S/P chemoradiation in 2015.  Admitted with cc of dyspnea, worsening throat swelling for the past 2 days associated with dysphagia and tenderness in her neck anteriorly along with non productive cough and hoarseness for the past 2-3 days.  Seen by ENT 10/9  - had esophageal dilation four weeks prior; dx acute pharyngeal/laryngeal swelling of uncertain etiology.  Hx of  difficulty swallowing dry solids, breads, meats since radiation and has worsened last several months.     Type of Study: Bedside Swallow Evaluation Previous Swallow Assessment: Esophagram 5/17: Some narrowing of the mid lower cervical esophagus may be related Diet Prior to this Study: NPO (icve chips) Temperature Spikes Noted: No Respiratory Status: Nasal cannula History of Recent Intubation: No Behavior/Cognition: Alert;Cooperative Oral Cavity Assessment: Within Functional Limits Oral Care Completed by SLP: No Oral Cavity - Dentition: Adequate natural dentition Vision: Functional for self-feeding Self-Feeding Abilities: Able to feed self Patient Positioning: Upright in bed Baseline Vocal Quality: Hoarse (mildly hoarse) Volitional Cough: Strong Volitional Swallow: Able to elicit    Oral/Motor/Sensory Function Overall Oral Motor/Sensory Function: Within functional limits   Ice Chips Ice chips: Impaired Presentation: Spoon Pharyngeal Phase Impairments: Multiple swallows;Throat Clearing - Immediate;Cough - Immediate   Thin Liquid Thin Liquid: Impaired Presentation: Straw Pharyngeal  Phase Impairments: Multiple swallows;Throat Clearing - Immediate;Cough - Immediate    Nectar Thick Nectar Thick Liquid: Not tested   Honey Thick Honey Thick Liquid: Not tested   Puree Puree: Not tested   Solid   GO   Solid: Not tested       Estill Bamberg L. Tivis Ringer, MA CCC/SLP Pager [phone]  Juan Quam Laurice 07/15/2016,10:08 AM

## 2016-07-15 NOTE — Progress Notes (Signed)
Speech Pathology:  MBSS complete. Full report located under chart review in imaging section. Pt may start a full liquid diet with liquids thickened to nectar.   Will follow while on acute care - may benefit from OP SLP services after D/C.  Eithel Ryall L. Tivis Ringer, [place] CCC/SLP Pager [phone]

## 2016-07-15 NOTE — Progress Notes (Signed)
07/15/2016 12:47 PM  Irven Coe [id number]  Hosp Day 3    Temp:  [97.2 F (36.2 C)-97.6 F (36.4 C)] 97.3 F (36.3 C) (10/10 1100) Pulse Rate:  [69-113] 79 (10/10 1100) Resp:  [12-25] 19 (10/10 1100) BP: (135-185)/(77-107) 136/98 (10/10 1100) SpO2:  [95 %-100 %] 100 % (10/10 1100),     Intake/Output Summary (Last 24 hours) at 07/15/16 1247 Last data filed at 07/15/16 0900  Gross per 24 hour  Intake             2400 ml  Output             1700 ml  Net              700 ml    Results for orders placed or performed during the hospital encounter of 07/13/16 (from the past 24 hour(s))  CBC     Status: Abnormal   Collection Time: 07/15/16  5:56 AM  Result Value Ref Range   WBC 16.5 (H) 4.0 - 10.5 K/uL   RBC 4.09 3.87 - 5.11 MIL/uL   Hemoglobin 12.2 12.0 - 15.0 g/dL   HCT 36.3 36.0 - 46.0 %   MCV 88.8 78.0 - 100.0 fL   MCH 29.8 26.0 - 34.0 pg   MCHC 33.6 30.0 - 36.0 g/dL   RDW 16.3 (H) 11.5 - 15.5 %   Platelets 316 150 - 400 K/uL  Basic metabolic panel     Status: Abnormal   Collection Time: 07/15/16  5:56 AM  Result Value Ref Range   Sodium 137 135 - 145 mmol/L   Potassium 3.9 3.5 - 5.1 mmol/L   Chloride 103 101 - 111 mmol/L   CO2 24 22 - 32 mmol/L   Glucose, Bld 141 (H) 65 - 99 mg/dL   BUN 11 6 - 20 mg/dL   Creatinine, Ser 0.79 0.44 - 1.00 mg/dL   Calcium 9.3 8.9 - 10.3 mg/dL   GFR calc non Af Amer >60 >60 mL/min   GFR calc Af Amer >60 >60 mL/min   Anion gap 10 5 - 15    SUBJECTIVE:  Voice and breathing back to baseline.  She thinks she may have aspirated a pill last weekend. I wonder if a pill could have become lodged and then caused a pharyngeal mucositis.  OBJECTIVE:  Voice clear.  Breathing well  IMPRESSION:  Possible pill erosion with secondary swelling causing hospital admission.  Longstanding dysphagia.  Not sure a MBS right now will necessarily be the most accurate study for her long term issues, but may be necessary to see if she is safe to go  home.  PLAN:  IP vs OP swallowing eval  Could go home when safe for po nutrition.  I would leave her on Cephalexin or similar for 5 more days.  Recheck Dr. Janace Hoard next week please.  [name]

## 2016-07-15 NOTE — Progress Notes (Signed)
PROGRESS NOTE        PATIENT DETAILS Name: Stacey Carroll Age: 55 y.o. Sex: female Date of Birth: 21-Nov-1960 Admit Date: 07/13/2016 Admitting Physician Gennaro Africa, MD [id number], Valla Leaver, MD  Brief Narrative: Patient is a 55 y.o. 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 She was evaluated by ENT, found to have acute laryngeal/pharyngeal swelling of unknown etiology. She was started on empiric antibiotics and steroids and admitted to the hospitalist service.  Subjective: Feels that her throat swelling is significantly better, who voice has better quality to it and is less hoarse. She wants to eat.  Assessment/Plan: Acute pharyngeal/laryngeal swelling of uncertain etiology: Clinically improved, no stridor-seems comfortable. Continue empiric antibiotics, start tapering steroids. ENT following.  Dysphagia: This is a chronic issue, and follows with [facility] gastroenterology (Dr. Amedeo Plenty). Could have worsened due to above, will get speech therapy evaluation-if no improvement, will likely need GI evaluation while inpatient.   Hypothyroidism: Continue IV Synthroid, transition to oral route when oral intake more stable.  GERD: Continue PPI  History of depression: Hold antidepressants-till oral intake more stable.  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  MEDICATIONS: Anti-infectives    Start     Dose/Rate Route Frequency Ordered Stop   07/13/16 2000  cefTRIAXone (ROCEPHIN) 2 g in dextrose 5 % 50 mL IVPB     2 g 100 mL/hr over 30 Minutes Intravenous Every 24 hours 07/13/16 1922     07/13/16 2000  clindamycin (CLEOCIN) IVPB 600 mg     600 mg 100 mL/hr over 30 Minutes Intravenous Every 8 hours 07/13/16 1922        Scheduled Meds: . cefTRIAXone (ROCEPHIN)  IV  2 g Intravenous Q24H  . clindamycin (CLEOCIN) IV  600 mg Intravenous Q8H  . dexamethasone  5 mg Intravenous Q6H  . enoxaparin (LOVENOX) injection  40 mg Subcutaneous Q24H  . famotidine (PEPCID) IV  20 mg Intravenous Q12H  . Influenza vac split quadrivalent PF  0.5 mL Intramuscular Tomorrow-1000  . levothyroxine  37.5 mcg Intravenous Daily  . mouth rinse  15 mL Mouth Rinse BID   Continuous Infusions: . dextrose 5 % and 0.9% NaCl 75 mL/hr at 07/14/16 2148   PRN Meds:.albuterol, LORazepam, morphine injection, phenol   PHYSICAL EXAM: Vital signs: Vitals:   07/15/16 0500 07/15/16 0536 07/15/16 0600 07/15/16 0700  BP: (!) 156/97 (!) 156/97 137/88 135/88  Pulse: 69  92 82  Resp: 14  14 15   Temp:    97.2 F (36.2 C)  TempSrc:    Oral  SpO2: 96%  96% 97%  Weight:      Height:       Filed Weights   07/13/16 1426 07/13/16 2102  Weight:  56.2 kg (124 lb) 60.1 kg (132 lb 7.9 oz)   Body mass index is 22.05 kg/m.   General appearance :Awake, alert, not in any distress. Speech Clear. Not toxic Looking. No stridor Eyes:, pupils equally reactive to light and accomodation,no scleral icterus.Pink conjunctiva HEENT: Atraumatic and Normocephalic Neck: supple, no JVD. No cervical lymphadenopathy. No thyromegaly Resp:Good air entry bilaterally, no added sounds  CVS: S1 S2 regular, no murmurs.  GI: Bowel sounds present, Non tender and not distended with no gaurding, rigidity or rebound.No organomegaly Extremities:  B/L Lower Ext shows no edema, both legs are warm to touch Neurology:  speech clear,Non focal, sensation is grossly intact. Psychiatric: Normal judgment and insight. Alert and oriented x 3. Normal mood. Musculoskeletal:gait appears to be normal.No digital cyanosis Skin:No Rash, warm and dry Wounds:N/A  I have personally reviewed following labs and imaging studies  LABORATORY DATA: CBC:  Recent Labs Lab 07/13/16 1442 07/14/16 0239 07/15/16 0556  WBC 10.8* 10.0 16.5*  NEUTROABS 8.4* 9.3*  --   HGB 14.4 13.4 12.2  HCT 43.0 40.0 36.3  MCV 89.0 89.1 88.8  PLT 343 314 [id number]    Basic Metabolic Panel:  Recent Labs Lab 07/13/16 1442 07/14/16 0239 07/15/16 0556  NA 136 136 137  K 4.0 4.3 3.9  CL 105 103 103  CO2 20* 22 24  GLUCOSE 99 190* 141*  BUN 9 11 11   CREATININE 1.10* 0.97 0.79  CALCIUM 9.9 9.4 9.3    GFR: Estimated Creatinine Clearance: 70.7 mL/min (by C-G formula based on SCr of 0.79 mg/dL).  Liver Function Tests:  Recent Labs Lab 07/14/16 0239  AST 25  ALT 28  ALKPHOS 123  BILITOT 0.3  PROT 6.6  ALBUMIN 3.4*   No results for input(s): LIPASE, AMYLASE in the last 168 hours. No results for input(s): AMMONIA in the last 168 hours.  Coagulation Profile: No results for input(s): INR, PROTIME in the last 168 hours.  Cardiac Enzymes: No results for input(s): CKTOTAL, CKMB, CKMBINDEX, TROPONINI in the last 168 hours.  BNP (last 3 results) No results for input(s): PROBNP in the last 8760 hours.  HbA1C: No results for input(s): HGBA1C in the last 72 hours.  CBG: No results for input(s): GLUCAP in the last 168 hours.  Lipid Profile: No results for input(s): CHOL, HDL, LDLCALC, TRIG, CHOLHDL, LDLDIRECT in the last 72 hours.  Thyroid Function Tests:  Recent Labs  07/13/16 1901  TSH 2.896    Anemia Panel: No results for input(s): VITAMINB12, FOLATE, FERRITIN, TIBC, IRON, RETICCTPCT in the last 72 hours.  Urine analysis:    Component Value  Date/Time   COLORURINE YELLOW 06/01/2014 0313   APPEARANCEUR CLEAR 06/01/2014 0313   LABSPEC 1.014 06/01/2014 0313   PHURINE 8.0 06/01/2014 0313   GLUCOSEU NEGATIVE 06/01/2014 0313   HGBUR NEGATIVE 06/01/2014 0313   BILIRUBINUR NEGATIVE 06/01/2014 0313   KETONESUR NEGATIVE 06/01/2014 0313   PROTEINUR NEGATIVE 06/01/2014 0313   UROBILINOGEN 0.2 06/01/2014 0313   NITRITE NEGATIVE 06/01/2014 0313   LEUKOCYTESUR NEGATIVE 06/01/2014 0313    Sepsis Labs: Lactic Acid, Venous No results found for: LATICACIDVEN  MICROBIOLOGY: Recent Results (from the past 240 hour(s))  MRSA PCR Screening     Status: None   Collection Time: 07/13/16  8:44 PM  Result Value Ref Range Status   MRSA by PCR NEGATIVE NEGATIVE Final    Comment:        The GeneXpert MRSA Assay (FDA approved for NASAL specimens only), is one component of  a comprehensive MRSA colonization surveillance program. It is not intended to diagnose MRSA infection nor to guide or monitor treatment for MRSA infections.     RADIOLOGY STUDIES/RESULTS: Ct Soft Tissue Neck W Contrast  Result Date: 07/13/2016 CLINICAL DATA:  Initial evaluation for acute cough, shortness of breath, swelling of neck with hoarseness. Prior history of throat cancer. EXAM: CT NECK WITH CONTRAST TECHNIQUE: Multidetector CT imaging of the neck was performed using the standard protocol following the bolus administration of intravenous contrast. CONTRAST:  70mL ISOVUE-300 IOPAMIDOL (ISOVUE-300) INJECTION 61% COMPARISON:  None. FINDINGS: Pharynx and larynx: Oral cavity within normal limits without acute inflammatory changes. Sublingual space within normal limits. No acute abnormality about the dentition. Nasopharynx within normal limits. Previously seen mass centered at the vallecula is no longer clearly visualized. Circumferential mucosal edema seen throughout the oropharynx, extending inferiorly into the hypopharynx and supraglottic larynx to the level of the true  cords. Piriform sinuses are largely effaced. Involvement of the retropharyngeal soft tissues which for swollen and edematous in appearance without frank retropharyngeal abscess or collection. The epiglottis is somewhat swollen and edematous. True cords themselves are fairly symmetric and within normal limits. Hazy inflammatory stranding extends into the parapharyngeal spaces, greater on the right. Mild hazy inflammatory stranding also extends into the submandibular spaces, also slightly worse on the right. Findings suggestive of acute pharyngitis/ supra blood tightness. No abscess or drainable fluid collection. Supraglottic airways attenuated and narrowed measuring 6 mm at its most narrow point. Layering secretions present within the hypopharynx. Salivary glands: Parotid glands within normal limits. Submandibular glands mildly atrophic, like related to post treatment changes. Thyroid: Thyroid within normal limits. Lymph nodes: No pathologically enlarged lymph nodes identified within the neck. Vascular: Normal intravascular enhancement seen throughout the neck. Vascular calcifications present within the aortic arch and about the carotid bifurcations. Limited intracranial: Visualized portions of the brain are within normal limits. Visualized orbits: Visualized globes and orbits are unremarkable. Mastoids and visualized paranasal sinuses: Scattered mucosal thickening within the right sphenoid sinus, likely chronic. Paranasal sinuses are otherwise clear. Mastoid air cells are clear. Middle ear cavities are well pneumatized. Skeleton: Patient is status post cervical ACDF extending from C4 through T1. 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 No acute osseous abnormality. No worrisome lytic or blastic osseous lesions. Upper chest: Visualized mediastinum within normal limits. Visualized lungs are clear. Other: No other significant finding. IMPRESSION: 1. Findings consistent with acute pharyngitis/ supraglottitis  as above. Secondary mass effect on the supraglottic airway which is narrowed and attenuated, measuring 6 mm at its most narrow point. No abscess or drainable collection identified. Findings may be infectious in nature or related to angioedema. Post treatment/radiation changes changes could also be considered in the correct clinical setting. Continued close clinical observation and follow-up is recommended. 2. Nonvisualization of previously identified mass centered at the vallecula. No adenopathy identified within the neck. Critical Value/emergent results were called by telephone at the time of interpretation on 07/13/2016 at 4:45 pm to Dr. Leo Grosser , who verbally acknowledged these results. Electronically Signed   By: Jeannine Boga M.D.   On: 07/13/2016 16:58     LOS: 2 days   Oren Binet, MD  [facility] Pager:[phone]  If 7PM-7AM, please contact night-coverage [email or web address] Password [email or web address] 07/15/2016, 10:56 AM

## 2016-07-16 DIAGNOSIS — D72829 Elevated white blood cell count, unspecified: Secondary | ICD-10-CM

## 2016-07-16 DIAGNOSIS — F329 Major depressive disorder, single episode, unspecified: Secondary | ICD-10-CM | POA: Diagnosis present

## 2016-07-16 DIAGNOSIS — E039 Hypothyroidism, unspecified: Secondary | ICD-10-CM | POA: Diagnosis present

## 2016-07-16 DIAGNOSIS — E038 Other specified hypothyroidism: Secondary | ICD-10-CM

## 2016-07-16 DIAGNOSIS — K219 Gastro-esophageal reflux disease without esophagitis: Secondary | ICD-10-CM | POA: Diagnosis present

## 2016-07-16 DIAGNOSIS — F3289 Other specified depressive episodes: Secondary | ICD-10-CM

## 2016-07-16 DIAGNOSIS — R1314 Dysphagia, pharyngoesophageal phase: Secondary | ICD-10-CM

## 2016-07-16 DIAGNOSIS — F32A Depression, unspecified: Secondary | ICD-10-CM | POA: Diagnosis present

## 2016-07-16 MED ORDER — PREDNISONE 10 MG PO TABS: ORAL_TABLET | ORAL | 0 refills | Status: DC

## 2016-07-16 MED ORDER — CEPHALEXIN 500 MG PO CAPS: 500.0000 mg | ORAL_CAPSULE | Freq: Two times a day (BID) | ORAL | 0 refills | Status: AC

## 2016-07-16 NOTE — Progress Notes (Signed)
Speech Language Pathology Treatment: Dysphagia  Patient Details Name: Stacey Carroll MRN: [id number] DOB: Aug 25, 1961 Today's Date: 07/16/2016 Time: [id number] SLP Time Calculation (min) (ACUTE ONLY): 25 min  Assessment / Plan / Recommendation Clinical Impression  Pt seen for assessment of diet tolerance and education following MBS completed yesterday. Pt recalled instruction given yesterday regarding cough/throat clear after the swallow. Results and recommendations of MBS were reviewed with pt, and safe swallow precautions discussed as well. Precautions posted at [facility]. Pt was observed with nectar thick liquids, and was noted to swallow multiple times after one sip, and cough following the swallow. Pt reports that this is what she was told to do during the MBS yesterday, and that she is very careful with po intake. Pt also indicated to SLP that she has not been receiving her home meds for depression, anxiety, or thyroid, and that they would help her feel much better. RN was informed of this concern. ST will continue to follow pt for assessment of diet tolerance and education.    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  MBS 04/2014 revealed severe pharyngeal dysphagia - prior to rad tx.  S/P chemoradiation in 2015.  Admitted with cc of dyspnea, worsening throat swelling for the past 2 days associated with dysphagia and tenderness in her neck anteriorly along with non productive cough and hoarseness for the past 2-3 days.  Seen by ENT who dx possible pill erosion with pharyngeal mucositis - had esophageal dilation four weeks prior; dx acute pharyngeal/laryngeal swelling of uncertain etiology.  Hx of difficulty swallowing dry solids, breads, meats since radiation and has worsened last several months.          SLP Plan   Recommend outpatient follow up given  history.    Recommendations  Diet recommendations: Nectar-thick liquid (full liquid) Liquids provided via: Cup;Straw Medication Administration: Crushed with puree Supervision: Patient able to self feed Compensations: Clear throat after each swallow;Minimize environmental distractions;Slow rate;Small sips/bites Postural Changes and/or Swallow Maneuvers: Seated upright 90 degrees      Oral Care Recommendations: Oral care QID Follow up Recommendations: Outpatient SLP       Stacey Carroll 07/16/2016, 11:12 AM  Stacey Chars B. [place], [place], [place]

## 2016-07-16 NOTE — Discharge Instructions (Signed)
1. Follow up with PCP in 1-2 weeks. 2. Follow up with ENT (Dr. Vassie Loll) in 1 week.  3. Please obtain CBC in one week.  Diet recommendations: Nectar-thick liquid (full liquid) Liquids provided via: Cup;Straw Medication Administration: Crushed with puree Supervision: Patient able to self feed Compensations: Clear throat after each swallow;Minimize environmental distractions;Slow rate;Small sips/bites Postural Changes and/or Swallow Maneuvers: Seated upright 90 degrees

## 2016-07-16 NOTE — Progress Notes (Signed)
07/16/2016 9:03 AM  Irven Coe [id number]  Hosp Day 4    Temp:  [97.3 F (36.3 C)-99.3 F (37.4 C)] 99.3 F (37.4 C) (10/11 0811) Pulse Rate:  [3-81] 67 (10/11 0811) Resp:  [10-27] 13 (10/11 0811) BP: (136-179)/(78-116) 179/78 (10/11 0811) SpO2:  [95 %-100 %] 95 % (10/11 0811),     Intake/Output Summary (Last 24 hours) at 07/16/16 0903 Last data filed at 07/16/16 0500  Gross per 24 hour  Intake             2385 ml  Output             1300 ml  Net             1085 ml    No results found for this or any previous visit (from the past 24 hour(s)).  SUBJECTIVE:  Throat feels better.   Able to swallow some. Speech Path with diet recommendations.  OBJECTIVE:   Voice clear.  Breathing well  IMPRESSION:  improved  PLAN:  OK for discharge from my standpoint.  Recheck Dr. Janace Hoard next week, [phone] please.  [name]

## 2016-07-16 NOTE — Discharge Summary (Signed)
Physician Discharge Summary  Stacey Carroll [id number] DOB: Jun 03, 1961 DOA: 07/13/2016  PCP: Jonathon Bellows, MD  Admit date: 07/13/2016 Discharge date: 07/16/2016  Admitted From: Home Disposition:  Home  Recommendations for Outpatient Follow-up:  1. Follow up with PCP in 1-2 weeks. 2. Follow up with ENT (Dr. Vassie Loll) in 1 week.  3. Please obtain CBC in one week.  Home Health: None, Outpatient SLP   Equipment/Devices: None   Discharge Condition: Stable CODE STATUS: Full  Diet recommendations: Nectar-thick liquid (full liquid) Liquids provided via: Cup;Straw Medication Administration: Crushed with puree Supervision: Patient able to self feed Compensations: Clear throat after each swallow;Minimize environmental distractions;Slow rate;Small sips/bites Postural Changes and/or Swallow Maneuvers: Seated upright 90 degrees  Brief/Interim Summary: Stacey Carroll is a 55 y.o.femalewith past medical history of T2N0 cancer of bilateral valleculae -s/p radiation therapy (2 years ago), she had a tracheostomy that was removed in 2016, she presented to the ED hoarseness of voice/sore throat and throat swelling. She was evaluated by ENT, found to have acute laryngeal/pharyngeal swelling of unknown etiology, possibly due to pill erosion. She was started on empiric antibiotics and steroids and admitted to the hospitalist service. She was also evaluated by SLP due to dysphagia. This is a chronic issue and she follows with GI outpatient. She underwent MBSS on 10/10 and has been on full liquid diet.   Discharge Diagnoses:  Principal Problem:   Supraglottic edema Active Problems:   Dysphagia   Leukocytosis   Hypothyroidism   GERD (gastroesophageal reflux disease)   Depression  Acute pharyngeal/laryngeal swelling of uncertain etiology -Possible pill erosion etiology  -Clinically improved, no stridor -Continue empiric antibiotics, cephalosporin until 10/15. -Tapering steroids -ENT following. Need  to follow up with Dr. Vassie Loll next week.   Dysphagia -This is a chronic issue, and follows with [facility] gastroenterology (Dr. Amedeo Plenty) -S/p esophageal dilation 4 weeks ago  -Could have worsened due to above -SLP eval appreciated. MBSS on 10/10. Full liquid diet. May need OP SLP.   Leukocytosis -Likely secondary to steroids  -Repeat CBC in 1 week   Hypothyroidism -Continue IV Synthroid -Transition to oral  GERD -Continue PPI  Depression -Continue home antidepressant  History of T2N0 cancer of bilateral valleculae -S/p radiation therapy (2 years ago), she had a tracheostomy that was removed in 2016  Discharge Instructions  Discharge Instructions    Call MD for:    Complete by:  As directed    Worsening neck swelling, difficulty breathing   Increase activity slowly    Complete by:  As directed        Medication List    TAKE these medications   buPROPion 150 MG 24 hr tablet Commonly known as:  WELLBUTRIN XL Take 150 mg by mouth daily.   cephALEXin 500 MG capsule Commonly known as:  KEFLEX Take 1 capsule (500 mg total) by mouth 2 (two) times daily.   diazepam 10 MG tablet Commonly known as:  VALIUM Take 10 mg by mouth 2 (two) times daily as needed for anxiety or sleep.   fentaNYL 50 MCG/HR Commonly known as:  DURAGESIC - dosed mcg/hr Place 1 patch (50 mcg total) onto the skin every 3 (three) days.   fexofenadine 180 MG tablet Commonly known as:  ALLEGRA Take 180 mg by mouth daily.   fluticasone 44 MCG/ACT inhaler Commonly known as:  FLOVENT HFA Inhale 2 puffs into the lungs as needed (for shortness of breath).   fluticasone 50 MCG/ACT nasal spray Commonly known as:  FLONASE  Use 2 sprays in each nostril daily as needed for allergies   lamoTRIgine 25 MG tablet Commonly known as:  LAMICTAL Take 25 mg by mouth 2 (two) times daily.   levothyroxine 50 MCG tablet Commonly known as:  LEVOTHROID Take 1 tablet (50 mcg total) by mouth daily before  breakfast. What changed:  how much to take   miconazole 2 % cream Commonly known as:  MICATIN Apply TID to rash over abdomen for 2 weeks.   omeprazole 40 MG capsule Commonly known as:  PRILOSEC Take 40 mg by mouth 2 (two) times daily before a meal.   PARoxetine 30 MG tablet Commonly known as:  PAXIL Take 30 mg by mouth 2 (two) times daily.   predniSONE 10 MG tablet Commonly known as:  DELTASONE Take 4 tabs for 1 day, then 3 tabs for 1 day, then 2 tabs for 2 days, then 1 tab for 3 days, then 1/2 tab for 4 days.   sodium fluoride 1.1 % Gel dental gel Commonly known as:  FLUORISHIELD Instill one drop of fluoride per tooth space of fluoride tray. Place over teeth for 5 minutes. Remove. Spit out excess. Repeat nightly.      Follow-up Information    WEBB, CAROL D, MD. Schedule an appointment as soon as possible for a visit in 1 week(s).   Specialty:  Family Medicine Contact information: [address] [place] [address] [phone]        Melissa Montane, MD. Schedule an appointment as soon as possible for a visit in 1 week(s).   Specialty:  Otolaryngology Contact information: [address] [place] [address] [phone]          Allergies  Allergen Reactions  . Penicillins Anaphylaxis and Other (See Comments)    From childhood  . Vancomycin Itching  . Barium Sulfate Itching and Rash    Consultations:  ENT   Procedures/Studies: Ct Soft Tissue Neck W Contrast  Result Date: 07/13/2016 CLINICAL DATA:  Initial evaluation for acute cough, shortness of breath, swelling of neck with hoarseness. Prior history of throat cancer. EXAM: CT NECK WITH CONTRAST TECHNIQUE: Multidetector CT imaging of the neck was performed using the standard protocol following the bolus administration of intravenous contrast. CONTRAST:  25mL ISOVUE-300 IOPAMIDOL (ISOVUE-300) INJECTION 61% COMPARISON:  None. FINDINGS: Pharynx and larynx: Oral cavity within  normal limits without acute inflammatory changes. Sublingual space within normal limits. No acute abnormality about the dentition. Nasopharynx within normal limits. Previously seen mass centered at the vallecula is no longer clearly visualized. Circumferential mucosal edema seen throughout the oropharynx, extending inferiorly into the hypopharynx and supraglottic larynx to the level of the true cords. Piriform sinuses are largely effaced. Involvement of the retropharyngeal soft tissues which for swollen and edematous in appearance without frank retropharyngeal abscess or collection. The epiglottis is somewhat swollen and edematous. True cords themselves are fairly symmetric and within normal limits. Hazy inflammatory stranding extends into the parapharyngeal spaces, greater on the right. Mild hazy inflammatory stranding also extends into the submandibular spaces, also slightly worse on the right. Findings suggestive of acute pharyngitis/ supra blood tightness. No abscess or drainable fluid collection. Supraglottic airways attenuated and narrowed measuring 6 mm at its most narrow point. Layering secretions present within the hypopharynx. Salivary glands: Parotid glands within normal limits. Submandibular glands mildly atrophic, like related to post treatment changes. Thyroid: Thyroid within normal limits. Lymph nodes: No pathologically enlarged lymph nodes identified within the neck. Vascular: Normal  intravascular enhancement seen throughout the neck. Vascular calcifications present within the aortic arch and about the carotid bifurcations. Limited intracranial: Visualized portions of the brain are within normal limits. Visualized orbits: Visualized globes and orbits are unremarkable. Mastoids and visualized paranasal sinuses: Scattered mucosal thickening within the right sphenoid sinus, likely chronic. Paranasal sinuses are otherwise clear. Mastoid air cells are clear. Middle ear cavities are well pneumatized.  Skeleton: Patient is status post cervical ACDF extending from C4 through T1. 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 No acute osseous abnormality. No worrisome lytic or blastic osseous lesions. Upper chest: Visualized mediastinum within normal limits. Visualized lungs are clear. Other: No other significant finding. IMPRESSION: 1. Findings consistent with acute pharyngitis/ supraglottitis as above. Secondary mass effect on the supraglottic airway which is narrowed and attenuated, measuring 6 mm at its most narrow point. No abscess or drainable collection identified. Findings may be infectious in nature or related to angioedema. Post treatment/radiation changes changes could also be considered in the correct clinical setting. Continued close clinical observation and follow-up is recommended. 2. Nonvisualization of previously identified mass centered at the vallecula. No adenopathy identified within the neck. Critical Value/emergent results were called by telephone at the time of interpretation on 07/13/2016 at 4:45 pm to Dr. Leo Grosser , who verbally acknowledged these results. Electronically Signed   By: Jeannine Boga M.D.   On: 07/13/2016 16:58   Dg Swallowing Func-speech Pathology  Result Date: 07/15/2016 Objective Swallowing Evaluation: Type of Study: MBS-Modified Barium Swallow Study Patient Details Name: KOBY PIERPOINT MRN: [id number] Date of Birth: 08-17-1961 Today's Date: 07/15/2016 Time: SLP Start Time (ACUTE ONLY): 1415-SLP Stop Time (ACUTE ONLY): 1430 SLP Time Calculation (min) (ACUTE ONLY): 15 min Past Medical History: Past Medical History: Diagnosis Date . Anxiety  . Arthritis  . Asthma  . Cancer ([place])  . Cough  . Depression  . Dyslipidemia  . Dysphagia 04/27/2014 . Endometriosis  . Headache(784.0)  . Hypercholesterolemia  . Hypertension   not taking meds . Nausea alone 05/31/2014 . PONV (postoperative nausea and vomiting)  . Radiation 05/24/14- 07/18/14  Vallecula, Epiglottis, and  bilateral neck.  . S/P radiation therapy 05/24/2014-07/18/2014  7000  cGy/ Vallecula/neck . Scoliosis  . Throat pain 04/19/2014 . Visual disturbance 03/11/2013 Past Surgical History: Past Surgical History: Procedure Laterality Date . ABDOMINAL HYSTERECTOMY  1987 . CERVICAL LAMINECTOMY  2007 (?), 2011 . GASTROSTOMY N/A 05/18/2014  Procedure: GASTROSTOMY/PLACEMENT OF G-TUBE;  Surgeon: Rolm Bookbinder, MD;  Location: [facility];  Service: General;  Laterality: N/A; . LARYNGOSCOPY AND ESOPHAGOSCOPY N/A 04/14/2014  Procedure: DIRECT LARYNGOSCOPY WITH BIOPSY  AND ESOPHAGOSCOPY;  Surgeon: Ruby Cola, MD;  Location: [facility];  Service: ENT;  Laterality: N/A; . PORTACATH PLACEMENT N/A 05/18/2014  Procedure: INSERTION PORT-A-CATH;  Surgeon: Rolm Bookbinder, MD;  Location: [facility];  Service: General;  Laterality: N/A; . SHOULDER SURGERY Left  . 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  MBS 04/2014 revealed severe pharyngeal dysphagia - prior to rad tx.  S/P chemoradiation in 2015.  Admitted with cc of dyspnea, worsening throat swelling for the past 2 days associated with dysphagia and tenderness in her neck anteriorly along with non productive cough and hoarseness for the past 2-3 days.  Seen by  ENT who dx possible pill erosion with pharyngeal mucositis - had esophageal dilation four weeks prior; dx acute pharyngeal/laryngeal swelling of uncertain etiology.  Hx of difficulty swallowing dry solids, breads, meats since radiation and has worsened last several months.     Subjective: pleasant, good historian Assessment / Plan / Recommendation CHL IP CLINICAL IMPRESSIONS 07/15/2016 Therapy Diagnosis Moderate pharyngeal phase dysphagia Clinical Impression Pt  presents with an acute-on-chronic dysphagia due to edema superimposed upon baseline poor hyolaryngeal mobility. There was prolonged oral preparation, reduced anterior and vertical movement of the larynx,  leading to consistent penetration of most POs and trace aspiration of thin liquids.  Both penetration and aspiration elicited a cough response; when penetrates reached the vocal cords, the cough effectively ejected the material from the larynx.  Postural adjustments were not beneficial. Recommend beginning liquids thickened to nectar.  Once edema has subsided pt may liberalize her diet independently - she has good sensation/awareness of PO toleration.  Pt has been modifying her diet for the last several months.  She may benefit from f/u OP SLP to address issues of fibrosis related to swallowing and the long-term compensations necessary for adequate nutrition.  Impact on safety and function Moderate aspiration risk   CHL IP TREATMENT RECOMMENDATION 07/15/2016 Treatment Recommendations Therapy as outlined in treatment plan below   Prognosis 07/15/2016 Prognosis for Safe Diet Advancement Fair Barriers to Reach Goals -- Barriers/Prognosis Comment -- CHL IP DIET RECOMMENDATION 07/15/2016 SLP Diet Recommendations Nectar thick liquid Liquid Administration via Straw;Cup Medication Administration (No Data) Compensations Clear throat after each swallow Postural Changes Seated upright at 90 degrees   CHL IP OTHER RECOMMENDATIONS 07/15/2016 Recommended Consults -- Oral Care Recommendations Oral care BID Other Recommendations Order thickener from pharmacy   CHL IP FOLLOW UP RECOMMENDATIONS 07/15/2016 Follow up Recommendations Outpatient SLP   CHL IP FREQUENCY AND DURATION 07/15/2016 Speech Therapy Frequency (ACUTE ONLY) min 2x/week Treatment Duration 1 week      CHL IP ORAL PHASE 07/15/2016 Oral Phase Impaired Oral - Pudding Teaspoon -- Oral - Pudding Cup -- Oral - Honey Teaspoon -- Oral - Honey Cup -- Oral - Nectar Teaspoon --  Oral - Nectar Cup (No Data) Oral - Nectar Straw -- Oral - Thin Teaspoon -- Oral - Thin Cup Delayed oral transit Oral - Thin Straw -- Oral - Puree Delayed oral transit Oral - Mech Soft -- Oral - Regular -- Oral - Multi-Consistency -- Oral - Pill -- Oral Phase - Comment --  CHL IP PHARYNGEAL PHASE 07/15/2016 Pharyngeal Phase Impaired Pharyngeal- Pudding Teaspoon -- Pharyngeal -- Pharyngeal- Pudding Cup -- Pharyngeal -- Pharyngeal- Honey Teaspoon -- Pharyngeal -- Pharyngeal- Honey Cup -- Pharyngeal -- Pharyngeal- Nectar Teaspoon -- Pharyngeal -- Pharyngeal- Nectar Cup Reduced epiglottic inversion;Reduced anterior laryngeal mobility;Reduced laryngeal elevation;Reduced airway/laryngeal closure;Reduced tongue base retraction;Penetration/Aspiration during swallow;Penetration/Apiration after swallow;Pharyngeal residue - valleculae;Pharyngeal residue - pyriform Pharyngeal Material enters airway, CONTACTS cords and then ejected out Pharyngeal- Nectar Straw -- Pharyngeal -- Pharyngeal- Thin Teaspoon -- Pharyngeal -- Pharyngeal- Thin Cup Reduced epiglottic inversion;Reduced anterior laryngeal mobility;Reduced laryngeal elevation;Reduced airway/laryngeal closure;Reduced tongue base retraction;Penetration/Aspiration during swallow;Penetration/Apiration after swallow;Trace aspiration Pharyngeal Material enters airway, passes BELOW cords and not ejected out despite cough attempt by patient Pharyngeal- Thin Straw -- Pharyngeal -- Pharyngeal- Puree Reduced epiglottic inversion;Reduced anterior laryngeal mobility;Reduced laryngeal elevation;Reduced airway/laryngeal closure;Reduced tongue base retraction Pharyngeal -- Pharyngeal- Mechanical Soft -- Pharyngeal -- Pharyngeal- Regular -- Pharyngeal -- Pharyngeal- Multi-consistency -- Pharyngeal -- Pharyngeal- Pill -- Pharyngeal -- Pharyngeal Comment --  No flowsheet data found.  CHL IP GO 04/27/2014 Functional Assessment Tool Used clinical judgement Functional Limitations Swallowing Swallow  Current Status [id number]) CL Swallow Goal Status [id number]) CL Swallow Discharge Status [id number]) CL Motor Speech Current Status [id number]) (None) Motor Speech Goal Status [id number]) (None) Motor Speech Goal Status [id number]) (None) Spoken Language Comprehension Current Status [id number]) (None) Spoken Language Comprehension Goal Status [id number]) (None) Spoken Language Comprehension Discharge Status [phone]) (None) Spoken Language Expression Current Status [phone]) (None) Spoken Language Expression Goal Status [id number]) (None) Spoken Language Expression Discharge Status [phone]) (None) Attention Current Status LV:671222) (None) Attention Goal Status FV:[id number]) (None) Attention Discharge Status [id number]) (None) Memory Current Status AE:130515) (None) Memory Goal Status GI:[id number]) (None) Memory Discharge Status [id number]) (None) Voice Current Status [id number]) (None) Voice Goal Status [id number]) (None) Voice Discharge Status [id number]) (None) Other Speech-Language Pathology Functional Limitation [phone]) (None) Other Speech-Language Pathology Functional Limitation Goal Status [id number]) (None) Other Speech-Language Pathology Functional Limitation Discharge Status [phone]) (None) Amanda L. Tivis Ringer, [place] CCC/SLP Pager [phone] Juan Quam Laurice 07/15/2016, 3:10 PM               Subjective: Doing well this morning. Denies any chest pain, shortness of breath, nausea, vomiting. Has been tolerating FLD. States that she is very familiar with dysphagia diet and aspiration precaution and follows with OP SLP. No abdominal pain. Swelling much improved.   Discharge Exam: Vitals:   07/16/16 0811 07/16/16 1214  BP: (!) 179/78 (!) 167/113  Pulse: 67 69  Resp: 13 17  Temp: 99.3 F (37.4 C) 97.6 F (36.4 C)   Vitals:   07/16/16 0445 07/16/16 0500 07/16/16 0811 07/16/16 1214  BP: 136/81 (!) 143/80 (!) 179/78 (!) 167/113  Pulse: 67 67 67 69  Resp: (!) 27 (!) 25 13 17   Temp: 97.5 F (36.4 C)  99.3 F (37.4 C) 97.6 F (36.4 C)  TempSrc: Oral  Oral Oral  SpO2: 97% 96%  95% 97%  Weight:      Height:        General: Pt is alert, awake, not in acute distress ENT: Left side neck with increased fullness compared to right  Cardiovascular: RRR, S1/S2 +, no rubs, no gallops Respiratory: CTA bilaterally, no wheezing, no rhonchi Abdominal: Soft, NT, ND, bowel sounds + Extremities: no edema, no cyanosis    The results of significant diagnostics from this hospitalization (including imaging, microbiology, ancillary and laboratory) are listed below for reference.     Microbiology: Recent Results (from the past 240 hour(s))  MRSA PCR Screening     Status: None   Collection Time: 07/13/16  8:44 PM  Result Value Ref Range Status   MRSA by PCR NEGATIVE NEGATIVE Final    Comment:        The GeneXpert MRSA Assay (FDA approved for NASAL specimens only), is one component of a comprehensive MRSA colonization surveillance program. It is not intended to diagnose MRSA infection nor to guide or monitor treatment for MRSA infections.      Labs: BNP (last 3 results) No results for input(s): BNP in the last 8760 hours. Basic Metabolic Panel:  Recent Labs Lab 07/13/16 1442 07/14/16 0239 07/15/16 0556  NA 136 136 137  K 4.0 4.3 3.9  CL 105 103 103  CO2 20* 22 24  GLUCOSE 99 190* 141*  BUN 9 11 11   CREATININE 1.10* 0.97 0.79  CALCIUM 9.9 9.4 9.3   Liver Function Tests:  Recent Labs Lab 07/14/16 0239  AST 25  ALT 28  ALKPHOS 123  BILITOT 0.3  PROT  6.6  ALBUMIN 3.4*   No results for input(s): LIPASE, AMYLASE in the last 168 hours. No results for input(s): AMMONIA in the last 168 hours. CBC:  Recent Labs Lab 07/13/16 1442 07/14/16 0239 07/15/16 0556  WBC 10.8* 10.0 16.5*  NEUTROABS 8.4* 9.3*  --   HGB 14.4 13.4 12.2  HCT 43.0 40.0 36.3  MCV 89.0 89.1 88.8  PLT 343 314 316   Cardiac Enzymes: No results for input(s): CKTOTAL, CKMB, CKMBINDEX, TROPONINI in the last 168 hours. BNP: Invalid input(s): POCBNP CBG: No results for  input(s): GLUCAP in the last 168 hours. D-Dimer No results for input(s): DDIMER in the last 72 hours. Hgb A1c No results for input(s): HGBA1C in the last 72 hours. Lipid Profile No results for input(s): CHOL, HDL, LDLCALC, TRIG, CHOLHDL, LDLDIRECT in the last 72 hours. Thyroid function studies  Recent Labs  07/13/16 1901  TSH 2.896   Anemia work up No results for input(s): VITAMINB12, FOLATE, FERRITIN, TIBC, IRON, RETICCTPCT in the last 72 hours. Urinalysis    Component Value Date/Time   COLORURINE YELLOW 06/01/2014 0313   APPEARANCEUR CLEAR 06/01/2014 0313   LABSPEC 1.014 06/01/2014 0313   PHURINE 8.0 06/01/2014 0313   GLUCOSEU NEGATIVE 06/01/2014 0313   HGBUR NEGATIVE 06/01/2014 0313   BILIRUBINUR NEGATIVE 06/01/2014 0313   KETONESUR NEGATIVE 06/01/2014 0313   PROTEINUR NEGATIVE 06/01/2014 0313   UROBILINOGEN 0.2 06/01/2014 0313   NITRITE NEGATIVE 06/01/2014 0313   LEUKOCYTESUR NEGATIVE 06/01/2014 0313   Sepsis Labs Invalid input(s): PROCALCITONIN,  WBC,  LACTICIDVEN Microbiology Recent Results (from the past 240 hour(s))  MRSA PCR Screening     Status: None   Collection Time: 07/13/16  8:44 PM  Result Value Ref Range Status   MRSA by PCR NEGATIVE NEGATIVE Final    Comment:        The GeneXpert MRSA Assay (FDA approved for NASAL specimens only), is one component of a comprehensive MRSA colonization surveillance program. It is not intended to diagnose MRSA infection nor to guide or monitor treatment for MRSA infections.      Time coordinating discharge: Over 30 minutes  SIGNED:  Dessa Phi, DO [facility] Pager [phone]  If 7PM-7AM, please contact night-coverage [email or web address] Password [email or web address] 07/16/2016, 2:09 PM

## 2016-07-24 ENCOUNTER — Telehealth: Payer: Self-pay | Admitting: *Deleted

## 2016-07-28 ENCOUNTER — Telehealth: Payer: Self-pay | Admitting: *Deleted

## 2016-07-29 ENCOUNTER — Ambulatory Visit: Admission: RE | Admit: 2016-07-29 | Payer: BLUE CROSS/BLUE SHIELD | Source: Ambulatory Visit

## 2016-07-29 ENCOUNTER — Ambulatory Visit: Payer: BLUE CROSS/BLUE SHIELD | Admitting: Physical Therapy

## 2016-07-29 ENCOUNTER — Encounter: Payer: Self-pay | Admitting: Nutrition

## 2016-07-29 ENCOUNTER — Ambulatory Visit: Payer: BLUE CROSS/BLUE SHIELD | Attending: Radiation Oncology

## 2016-07-29 ENCOUNTER — Telehealth: Payer: Self-pay | Admitting: *Deleted

## 2016-07-29 DIAGNOSIS — R1313 Dysphagia, pharyngeal phase: Secondary | ICD-10-CM | POA: Insufficient documentation

## 2016-07-29 NOTE — Telephone Encounter (Signed)
Oncology Nurse Navigator Documentation  LVMM reminding Stacey Carroll of tomorrow morning's [place] with [place] arrival to Radiation Waiting following registration.  Requested call back to confirm.  Gayleen Orem, RN, BSN, [place] at [place] [phone]

## 2016-07-29 NOTE — Telephone Encounter (Signed)
Oncology Nurse Navigator Documentation  Received VMM from Ms. Hedgepeth indicating she thought arrival for [facility] was 0945, would not be able to attend clinic this morning. She requested reschedule.  Gayleen Orem, RN, BSN, [place] at [place] [phone]

## 2016-07-29 NOTE — Telephone Encounter (Signed)
Oncology Nurse Navigator Documentation  Spoke with Ms. Babka, checked on availability to attend next Tuesday morning's [facility]&N M[facility].  She accepted, I provided her an arrival time of 0845 to Radiation Waiting following registration.  She voiced understanding.  Gayleen Orem, RN, BSN, [place] at [place] [phone]

## 2016-08-04 ENCOUNTER — Ambulatory Visit: Payer: BLUE CROSS/BLUE SHIELD

## 2016-08-04 ENCOUNTER — Ambulatory Visit: Payer: Self-pay

## 2016-08-04 DIAGNOSIS — R1313 Dysphagia, pharyngeal phase: Secondary | ICD-10-CM

## 2016-08-04 NOTE — Patient Instructions (Signed)
SWALLOWING EXERCISES Do these 6 of the 7 days per week  1. Effortful Swallows - Press your tongue against the roof of your mouth for 3 seconds, then squeeze          the muscles in your neck while you swallow your saliva or a sip of water - Repeat 20 times, 2-3 times a day, and use whenever you eat or drink  2. Masako Swallow - swallow with your tongue sticking out - Stick tongue out past your teeth and gently bite tongue with your teeth - Swallow, while holding your tongue with your teeth - Repeat 20 times, 2-3 times a day *use a wet spoon if your mouth gets dry*  3. Pitch Raise - Repeat "he", once per second in as high of a pitch as you can - Repeat 20 times, 2-3 times a day  4. Mendelsohn Maneuver - "half swallow" exercise - Start to swallow, and keep your Adam's apple up by squeezing hard with the            muscles of the throat - Hold the squeeze for 5-7 seconds and then relax - Repeat 20 times, 2-3 times a day *use a wet spoon if your mouth gets dry*  5. Breath Hold - Say "HUH!" loudly, then hold your breath for 3 seconds at your voice box - Repeat 20 times, 2-3 times a day        6.  Towel hold  - roll up a bath towel to approx 3-4 inches in diameter and place under your chin near your neck  - hold it there for 45-60 seconds, twice a day

## 2016-08-04 NOTE — Therapy (Signed)
[facility] [address] [place], [place], [address] Phone: [phone]   Fax:  [phone]  Speech Language Pathology Evaluation  Patient Details  Name: Stacey Carroll MRN: [id number] Date of Birth: 28-Apr-1961 Referring Provider: Eppie Gibson MD  Encounter Date: 08/04/2016      End of Session - 08/04/16 1219    Visit Number 1   Number of Visits 8   Date for SLP Re-Evaluation 10/10/16   SLP Start Time 17   SLP Stop Time  1107   SLP Time Calculation (min) 41 min   Activity Tolerance Patient tolerated treatment well      Past Medical History:  Diagnosis Date  . Anxiety   . Arthritis   . Asthma   . Cancer ([place])   . Cough   . Depression   . Dyslipidemia   . Dysphagia 04/27/2014  . Endometriosis   . Headache(784.0)   . Hypercholesterolemia   . Hypertension    not taking meds  . Nausea alone 05/31/2014  . PONV (postoperative nausea and vomiting)   . Radiation 05/24/14- 07/18/14   Vallecula, Epiglottis, and bilateral neck.   . S/P radiation therapy 05/24/2014-07/18/2014   7000  cGy/ Vallecula/neck  . Scoliosis   . Throat pain 04/19/2014  . Visual disturbance 03/11/2013    Past Surgical History:  Procedure Laterality Date  . ABDOMINAL HYSTERECTOMY  1987  . CERVICAL LAMINECTOMY  2007 (?), 2011  . GASTROSTOMY N/A 05/18/2014   Procedure: GASTROSTOMY/PLACEMENT OF G-TUBE;  Surgeon: Rolm Bookbinder, MD;  Location: [facility];  Service: General;  Laterality: N/A;  . LARYNGOSCOPY AND ESOPHAGOSCOPY N/A 04/14/2014   Procedure: DIRECT LARYNGOSCOPY WITH BIOPSY  AND ESOPHAGOSCOPY;  Surgeon: Ruby Cola, MD;  Location: [facility];  Service: ENT;  Laterality: N/A;  . PORTACATH PLACEMENT N/A 05/18/2014   Procedure: INSERTION PORT-A-CATH;  Surgeon: Rolm Bookbinder, MD;  Location: [facility];  Service: General;  Laterality: N/A;  . SHOULDER SURGERY Left   . TRACHEOSTOMY TUBE PLACEMENT N/A 05/18/2014   Procedure: TRACHEOSTOMY;   Surgeon: Melissa Montane, MD;  Location: [facility];  Service: ENT;  Laterality: N/A;    There were no vitals filed for this visit.      Subjective Assessment - 08/04/16 1125    Subjective Pt was seen by SLP in late 2015. Pt to see GI MD today to assess whether or not pt requires dilation.    Patient is accompained by: Family member  son            SLP Evaluation [place] - 08/04/16 1127      SLP Visit Information   SLP Received On 08/04/16   Referring Provider Eppie Gibson MD   Onset Date 2015   Medical Diagnosis S/P head/neck cancer     Pain Assessment   Pain Score 7    Pain Location Head   Pain Frequency Constant     General Information   HPI Pt with longstanding difficulty with dysaphagia. In October 2015 pt was seen by this SLP and was prescribed HEP for dysphagia but pt cancelled her only follow up in February 2016, saying she would call back to reschedule and did not do so. In February, pt with C7-T1 fusion surgery with instrumentation.     Prior Functional Status   Cognitive/Linguistic Baseline Within functional limits     Cognition   Overall Cognitive Status Within Functional Limits for tasks assessed     Oral Motor/Sensory Function   Overall Oral Motor/Sensory Function  Appears within functional limits for tasks assessed   Labial Symmetry Within Functional Limits   Labial Strength Within Functional Limits   Labial Coordination WFL   Lingual ROM Within Functional Limits   Lingual Symmetry Within Functional Limits   Lingual Strength Within Functional Limits   Lingual Coordination WFL   Facial Symmetry Within Functional Limits   Velum Within Functional Limits     Motor Speech   Overall Motor Speech Appears within functional limits for tasks assessed      Pt currently reportedly consuming softer diet/nectar liquids, however arrives today with thin liquid icewater.  POs: Pt drank icewater with cough immediately afterwards. Thyroid elevation appeared possibly only  slightly limited, and swallows appeared timely. Modified barium swallow exam completed on 07-15-16, see that exam for details. Oral residue noted as WNL, however pt swallowed x5 with small sips. With consecutive sips, pt did not cough. She has a follow up with her GI MD this afternoon to consult whether or not esophageal dilation would again be helpful to her.  SLP educated pt re: changes to swallowing musculature after rad tx, and why adherence to dysphagia HEP provided today was important in order to strengthen any weakened pharyngeal musculature and hopefully reduce any possible muscle fibrosis. Pt demonstrated understanding of these things to SLP. Further education was provided re: recommendation from modified barium swallow exam for nectar-thick liquids.    SLP developed a HEP for pt and pt was instructed how to perform exercises involving lingual, vocal, and pharyngeal strengthening. SLP performed each exercise and pt return demonstrated each exercise after SLP min-mod cues consistently. SLP ensured pt performance was correct prior to moving on to next exercise. Pt was instructed to complete this program 2 times a day.                    SLP Education - 08/04/16 1218    Education provided Yes   Education Details HEP   Person(s) Educated Patient;Child(ren)   Methods Explanation;Demonstration;Verbal cues;Handout   Comprehension Verbalized understanding;Returned demonstration;Verbal cues required;Need further instruction           SLP Long Term Goals - 08/04/16 1230      SLP LONG TERM GOAL #1   Title pt will demo HEP for swallowing with modified independence over three sessions   Time 4   Period Weeks  or 8 visits, with all LTGs   Status New     SLP LONG TERM GOAL #2   Title pt will thicken liquids to appropriate consistency over two sessions   Time 4   Period Weeks   Status New     SLP LONG TERM GOAL #3   Title pt will demo swallow precautions with POs during 2  ST sessions   Time 4   Period Weeks   Status New          Plan - 08/04/16 1220    Clinical Impression Statement Pt presents today with mod pharyngeal dysphagia, ID'd by modified (MBSS) 07-15-16. Pt currently taking liquids and softer foods - SLP educated pt liquids would be best to be nectar-consistency as pt arrived today with thin liquid icewater. SLP palpated pt's submental/thyroid/medial and lateral neck area and found what appeared to be area of mod fibrosis of lt thyroid/lateral to thyroid. This is likely hindering pt's pharyngeal swallow. Additionally, pt may be having additional difficulty with phayrngeal stage due to a disk fusion of C7-T1. She has had previous esophageal dilation surgeries in the past and  has a consult with her GI MD this afternoon to inquire whether or not further dilation is necessary. Pt will benefit from skilled ST to address any pharyngeal weakness or fibrosis with swallowing exercises. SLP to monitor pt success with procedure of HEP, as well as monitor pt safety with POs. Note that pt's attendance to therapy appointments has not been ideal in the past.    Speech Therapy Frequency 2x / week   Duration 4 weeks  or 8 visits   Treatment/Interventions Aspiration precaution training;Pharyngeal strengthening exercises;Diet toleration management by SLP;Environmental controls;Internal/external aids;SLP instruction and feedback;Patient/family education;Trials of upgraded texture/liquids  any or all may be used   Potential to Achieve Goals Fair   Potential Considerations Severity of impairments;Cooperation/participation level      Patient will benefit from skilled therapeutic intervention in order to improve the following deficits and impairments:   Dysphagia, pharyngeal phase    Problem List Patient Active Problem List   Diagnosis Date Noted  . Leukocytosis 07/16/2016  . Hypothyroidism 07/16/2016  . GERD (gastroesophageal reflux disease) 07/16/2016  . Depression  07/16/2016  . Supraglottic edema 07/13/2016  . Bilateral hearing loss 01/17/2015  . Preventive measure 06/14/2014  . Anemia in neoplastic disease 06/14/2014  . Nausea without vomiting 05/31/2014  . S/P percutaneous endoscopic gastrostomy (PEG) tube placement ([facility]) 05/24/2014  . Tracheostomy status ([place]) 05/24/2014  . Protein-calorie malnutrition, severe ([place]) 05/19/2014  . Dysphagia 04/27/2014  . 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 DJD (degenerative joint disease) of cervical spine 04/19/2014    [facility] ,[place], CCC-SLP  08/04/2016, 12:32 PM  [facility] [address] [place], [place], [address] Phone: [phone]   Fax:  [phone]  Name: Stacey Carroll MRN: [id number] Date of Birth: 1961-09-01

## 2016-08-26 ENCOUNTER — Encounter: Payer: Self-pay | Admitting: Radiation Oncology

## 2016-08-26 NOTE — Progress Notes (Signed)
Recent office note sent to [facility] 11/21, confirmation received, faxed to [phone] attn Alda Ponder

## 2016-09-08 ENCOUNTER — Ambulatory Visit: Payer: BLUE CROSS/BLUE SHIELD | Attending: Radiation Oncology

## 2016-11-05 ENCOUNTER — Other Ambulatory Visit: Payer: Self-pay | Admitting: Orthopaedic Surgery

## 2016-11-05 DIAGNOSIS — M4322 Fusion of spine, cervical region: Secondary | ICD-10-CM

## 2016-11-07 ENCOUNTER — Ambulatory Visit
Admission: RE | Admit: 2016-11-07 | Discharge: 2016-11-07 | Disposition: A | Discharge status: Home / Self Care | Payer: Medicare Other | Source: Ambulatory Visit | Attending: Orthopaedic Surgery | Admitting: Orthopaedic Surgery

## 2016-11-07 DIAGNOSIS — M4322 Fusion of spine, cervical region: Secondary | ICD-10-CM

## 2017-02-12 ENCOUNTER — Encounter: Payer: Self-pay | Admitting: Hematology and Oncology

## 2017-02-12 ENCOUNTER — Ambulatory Visit: Payer: Self-pay | Admitting: Hematology and Oncology

## 2017-02-12 ENCOUNTER — Encounter: Payer: Self-pay | Admitting: Adult Health

## 2017-05-05 ENCOUNTER — Other Ambulatory Visit: Payer: Self-pay | Admitting: Family Medicine

## 2017-05-05 DIAGNOSIS — Z1231 Encounter for screening mammogram for malignant neoplasm of breast: Secondary | ICD-10-CM

## 2017-05-13 ENCOUNTER — Ambulatory Visit
Admission: RE | Admit: 2017-05-13 | Discharge: 2017-05-13 | Disposition: A | Discharge status: Home / Self Care | Payer: Medicare Other | Source: Ambulatory Visit | Attending: Family Medicine | Admitting: Family Medicine

## 2017-05-13 DIAGNOSIS — Z1231 Encounter for screening mammogram for malignant neoplasm of breast: Secondary | ICD-10-CM

## 2017-08-13 NOTE — Therapy (Signed)
[facility] [address] [place], [place], [address] Phone: [phone]   Fax:  [phone]  Patient Details  Name: Stacey Carroll MRN: [id number] Date of Birth: Feb 11, 1961 Referring Provider:  Eppie Gibson MD  Encounter Date: 08/13/2017  SPEECH THERAPY DISCHARGE SUMMARY  Visits from Start of Care: ONE (evaluation)  Current functional level related to goals / functional outcomes: Therapy was recommended x2/ week x4 weeks however pt did not schedule follow up appointments. Goals on evaluation were as follows:  SLP Long Term Goals - 08/04/16 1230              SLP LONG TERM GOAL #1    Title pt will demo HEP for swallowing with modified independence over three sessions    Time 4    Period Weeks  or 8 visits, with all LTGs    Status New         SLP LONG TERM GOAL #2    Title pt will thicken liquids to appropriate consistency over two sessions    Time 4    Period Weeks    Status New         SLP LONG TERM GOAL #3    Title pt will demo swallow precautions with POs during 2 ST sessions    Time 4    Period Weeks    Status New         Remaining deficits: Assumed deficits remain due to pt did not return to ST.   Education / Equipment: HEP for swallowing, swallow precautions.  Plan: Patient agrees to discharge.  Patient goals were not met. Patient is being discharged due to not returning since the last visit.  ?????      [place] ,[place], CCC-SLP  08/13/2017, 9:29 AM  [facility] [address] [place], [place], [address] Phone: [phone]   Fax:  [phone]

## 2017-11-13 ENCOUNTER — Other Ambulatory Visit: Payer: Self-pay | Admitting: Family Medicine

## 2017-11-13 ENCOUNTER — Ambulatory Visit
Admission: RE | Admit: 2017-11-13 | Discharge: 2017-11-13 | Disposition: A | Discharge status: Home / Self Care | Payer: Medicare Other | Source: Ambulatory Visit | Attending: Family Medicine | Admitting: Family Medicine

## 2017-11-13 DIAGNOSIS — R059 Cough, unspecified: Secondary | ICD-10-CM

## 2017-11-13 DIAGNOSIS — F17200 Nicotine dependence, unspecified, uncomplicated: Secondary | ICD-10-CM

## 2017-11-13 DIAGNOSIS — R05 Cough: Secondary | ICD-10-CM

## 2017-11-16 ENCOUNTER — Other Ambulatory Visit: Payer: Self-pay | Admitting: Family Medicine

## 2017-11-16 DIAGNOSIS — R413 Other amnesia: Secondary | ICD-10-CM

## 2017-11-24 ENCOUNTER — Inpatient Hospital Stay
Admission: RE | Admit: 2017-11-24 | Discharge: 2017-11-24 | Disposition: A | Discharge status: Home / Self Care | Payer: Self-pay | Source: Ambulatory Visit | Attending: Family Medicine | Admitting: Family Medicine

## 2017-11-29 ENCOUNTER — Ambulatory Visit
Admission: RE | Admit: 2017-11-29 | Discharge: 2017-11-29 | Disposition: A | Discharge status: Home / Self Care | Payer: Medicare Other | Source: Ambulatory Visit | Attending: Family Medicine | Admitting: Family Medicine

## 2017-11-29 DIAGNOSIS — R413 Other amnesia: Secondary | ICD-10-CM

## 2018-07-20 ENCOUNTER — Other Ambulatory Visit: Payer: Self-pay | Admitting: Family Medicine

## 2018-07-20 DIAGNOSIS — Z1231 Encounter for screening mammogram for malignant neoplasm of breast: Secondary | ICD-10-CM

## 2018-07-22 ENCOUNTER — Ambulatory Visit: Payer: Self-pay

## 2018-07-28 ENCOUNTER — Ambulatory Visit
Admission: RE | Admit: 2018-07-28 | Discharge: 2018-07-28 | Disposition: A | Discharge status: Home / Self Care | Payer: Medicare Other | Source: Ambulatory Visit | Attending: Family Medicine | Admitting: Family Medicine

## 2018-07-28 DIAGNOSIS — Z1231 Encounter for screening mammogram for malignant neoplasm of breast: Secondary | ICD-10-CM

## 2018-08-04 ENCOUNTER — Other Ambulatory Visit: Payer: Self-pay | Admitting: Family Medicine

## 2018-08-04 DIAGNOSIS — R7989 Other specified abnormal findings of blood chemistry: Secondary | ICD-10-CM

## 2018-08-04 DIAGNOSIS — R945 Abnormal results of liver function studies: Principal | ICD-10-CM

## 2018-08-11 ENCOUNTER — Ambulatory Visit
Admission: RE | Admit: 2018-08-11 | Discharge: 2018-08-11 | Disposition: A | Discharge status: Home / Self Care | Payer: Medicare Other | Source: Ambulatory Visit | Attending: Family Medicine | Admitting: Family Medicine

## 2018-08-11 DIAGNOSIS — R945 Abnormal results of liver function studies: Principal | ICD-10-CM

## 2018-08-11 DIAGNOSIS — R7989 Other specified abnormal findings of blood chemistry: Secondary | ICD-10-CM

## 2018-09-20 ENCOUNTER — Telehealth: Payer: Self-pay | Admitting: Neurology

## 2018-09-20 ENCOUNTER — Ambulatory Visit: Payer: Medicare Other | Admitting: Neurology

## 2018-09-20 ENCOUNTER — Encounter: Payer: Self-pay | Admitting: Neurology

## 2018-09-20 VITALS — BP 139/97 | HR 89 | Ht 65.0 in | Wt 154.5 lb

## 2018-09-20 DIAGNOSIS — R269 Unspecified abnormalities of gait and mobility: Secondary | ICD-10-CM

## 2018-09-20 DIAGNOSIS — G3281 Cerebellar ataxia in diseases classified elsewhere: Secondary | ICD-10-CM

## 2018-09-20 DIAGNOSIS — R413 Other amnesia: Secondary | ICD-10-CM | POA: Diagnosis not present

## 2018-09-20 NOTE — Telephone Encounter (Signed)
[employer] auth order sent to GI. lvm for pt to be aware I gave her GI phone number of [phone] and to give them a call if she has not heard in the next 2-3 business days.

## 2018-09-20 NOTE — Progress Notes (Signed)
PATIENT: Stacey Carroll DOB: Feb 13, 1961  Chief Complaint  Patient presents with  . Memory Loss    MMSE 20/30 - 9 animals.  She is here with her son, Stacey Carroll, today. She is very tearful.  Reports a slow, steady decline in memory over the last six years.  She gives the example of frequently waking up confused to her whereabouts.  She often forgets her medications.  She also has word finding difficulty.   Marland Kitchen PCP    Maurice Small, MD     HISTORICAL  Stacey Carroll  is a 57 year old female, accompanied by her son Stacey Carroll, seen in request by her primary care physician Dr. Justin Mend, Arbie Cookey, for evaluation of memory loss, gait abnormality, initial evaluation was on September 21, 2018.  I have reviewed and summarized the referring note from the referring physician. I was able to review oncology note by Dr. Alvy Bimler in April 2016, she had oropharyngeal cancer, stage T3 N2C, M0, status post chemoradiation therapy in 2015, afterwards, she developed significant dysphagia, to go ENT procedure to stretch esophagus restriction, continue have significant swallowing difficulty, rely on daily ice cream and chocolate milk.  She used to work as an Geophysical data processor for [employer], triage phone calls, was not able to go back to her job since treatment, she can no longer keep up with her password to log in the computer system, memory loss gradually getting worse, especially concurrent with her worsening depression, after she has lost multiple family members and short period of time, she now lives alone, her only child Stacey Carroll lives behind her, check on her regularly,  Since chemo/radiation therapy, she also complains of gait abnormality, hearing loss, denies significant bowel and bladder incontinence.  We personally reviewed MRI of the brain without contrast in February 2019, mild generalized atrophy, complete opacification of sphenoid sinus, there was no acute abnormality.  CT of cervical spine in February  2018, congenital fusion of C2 and 3, anterior fusion C4-T1 with solid fusion, moderate left foraminal narrowing at C3-4,  REVIEW OF SYSTEMS: Full 14 system review of systems performed and notable only for weight gain, cough, wheezing, hearing loss, trouble swallowing, memory loss, confusion, headaches, weakness, slurred speech, difficulty swallowing, sleepiness, restless leg, depression, anxiety, decreased energy disinterested in activities, racing thoughts. All other review of systems were negative.  ALLERGIES: Allergies  Allergen Reactions  . Penicillins Anaphylaxis and Other (See Comments)    From childhood  . Vancomycin Itching  . Barium Sulfate Itching and Rash    HOME MEDICATIONS: Current Outpatient Medications  Medication Sig Dispense Refill  . albuterol (ACCUNEB) 0.63 MG/3ML nebulizer solution Take 1 ampule by nebulization every 6 (six) hours as needed for wheezing.    Marland Kitchen amLODipine (NORVASC) 5 MG tablet Take 5 mg by mouth daily.    Marland Kitchen buPROPion (WELLBUTRIN XL) 300 MG 24 hr tablet Take 300 mg by mouth daily.    . diazepam (VALIUM) 10 MG tablet Take 10 mg by mouth 2 (two) times daily as needed for anxiety.    . fluticasone (FLONASE) 50 MCG/ACT nasal spray Place 1 spray into both nostrils daily.    Marland Kitchen lamoTRIgine (LAMICTAL) 100 MG tablet Take 100 mg by mouth daily.    Marland Kitchen LEVOTHYROXINE SODIUM PO Take by mouth daily.    . pantoprazole (PROTONIX) 40 MG tablet Take 40 mg by mouth daily.    Marland Kitchen PARoxetine (PAXIL) 40 MG tablet Take 40 mg by mouth 2 (two) times daily.    Marland Kitchen  pravastatin (PRAVACHOL) 20 MG tablet Take 20 mg by mouth daily.    Marland Kitchen zolpidem (AMBIEN) 10 MG tablet Take 10 mg by mouth at bedtime as needed for sleep.     No current facility-administered medications for this visit.     PAST MEDICAL HISTORY: Past Medical History:  Diagnosis Date  . Anxiety   . Arthritis   . Asthma   . Cancer ([place]) 2015   stage 4 throat cancer  . Cough   . Depression   . Dyslipidemia   .  Dysphagia 04/27/2014  . Endometriosis   . Hard of hearing   . Headache(784.0)   . Hypercholesterolemia   . Hypertension    not taking meds  . Memory loss   . Nausea alone 05/31/2014  . PONV (postoperative nausea and vomiting)   . Radiation 05/24/14- 07/18/14   Vallecula, Epiglottis, and bilateral neck.   . S/P radiation therapy 05/24/2014-07/18/2014   7000  cGy/ Vallecula/neck  . Scoliosis   . Throat pain 04/19/2014  . Visual disturbance 03/11/2013    PAST SURGICAL HISTORY: Past Surgical History:  Procedure Laterality Date  . ABDOMINAL HYSTERECTOMY  1987  . CERVICAL LAMINECTOMY  2007 (?), 2011  . GASTROSTOMY N/A 05/18/2014   Procedure: GASTROSTOMY/PLACEMENT OF G-TUBE;  Surgeon: Rolm Bookbinder, MD;  Location: [facility];  Service: General;  Laterality: N/A;  . LARYNGOSCOPY AND ESOPHAGOSCOPY N/A 04/14/2014   Procedure: DIRECT LARYNGOSCOPY WITH BIOPSY  AND ESOPHAGOSCOPY;  Surgeon: Ruby Cola, MD;  Location: [facility];  Service: ENT;  Laterality: N/A;  . PORTACATH PLACEMENT N/A 05/18/2014   Procedure: INSERTION PORT-A-CATH;  Surgeon: Rolm Bookbinder, MD;  Location: [facility];  Service: General;  Laterality: N/A;  . SHOULDER SURGERY Left   . TRACHEOSTOMY TUBE PLACEMENT N/A 05/18/2014   Procedure: TRACHEOSTOMY;  Surgeon: Melissa Montane, MD;  Location: [facility];  Service: ENT;  Laterality: N/A;    FAMILY HISTORY: Family History  Problem Relation Age of Onset  . Emphysema Mother   . Cancer Mother        throat ca  . Cancer Father        prostate ca  . Heart disease Father   . Breast cancer Neg Hx     SOCIAL HISTORY: Social History   Socioeconomic History  . Marital status: Single    Spouse name: Not on file  . Number of children: 1  . Years of education: 9th grade  . Highest education level: Not on file  Occupational History  . Occupation: Disabled  Social Needs  . Financial resource strain: Not on file  . Food insecurity:    Worry: Not on file    Inability: Not on file  .  Transportation needs:    Medical: Not on file    Non-medical: Not on file  Tobacco Use  . Smoking status: Current Every Day Smoker    Packs/day: 0.50    Years: 39.00    Pack years: 19.50    Types: Cigarettes  . Smokeless tobacco: Never Used  Substance and Sexual Activity  . Alcohol use: Yes    Comment: occasional  . Drug use: No  . Sexual activity: Not on file  Lifestyle  . Physical activity:    Days per week: Not on file    Minutes per session: Not on file  . Stress: Not on file  Relationships  . Social connections:    Talks on phone: Not on file    Gets together: Not on file  Attends religious service: Not on file    Active member of club or organization: Not on file    Attends meetings of clubs or organizations: Not on file    Relationship status: Not on file  . Intimate partner violence:    Fear of current or ex partner: Not on file    Emotionally abused: Not on file    Physically abused: Not on file    Forced sexual activity: Not on file  Other Topics Concern  . Not on file  Social History Narrative   Lives at home alone.  Her son has a house next door (located on the same property).   Right-handed.   No caffeine use.     PHYSICAL EXAM   Vitals:   09/20/18 1352  BP: (!) 139/97  Pulse: 89  Weight: 154 lb 8 oz (70.1 kg)  Height: 5\' 5"  (1.651 m)    Not recorded      Body mass index is 25.71 kg/m.  PHYSICAL EXAMNIATION:  Gen: NAD, conversant, well nourised, obese, well groomed                     Cardiovascular: Regular rate rhythm, no peripheral edema, warm, nontender. Eyes: Conjunctivae clear without exudates or hemorrhage Neck: Supple, no carotid bruits. Pulmonary: Clear to auscultation bilaterally   NEUROLOGICAL EXAM:  MMSE - Mini Mental State Exam 09/20/2018  Orientation to time 2  Orientation to Place 5  Registration 3  Attention/ Calculation 0  Recall 2  Language- name 2 objects 2  Language- repeat 0  Language- follow 3 step  command 3  Language- read & follow direction 1  Write a sentence 1  Copy design 1  Total score 20  animal naming 8.   CRANIAL NERVES: CN II: Visual fields are full to confrontation. Pupils are round equal and briskly reactive to light. CN III, IV, VI: extraocular movement are normal. No ptosis. CN V: Facial sensation is intact to pinprick in all 3 divisions bilaterally. Corneal responses are intact.  CN VII: Face is symmetric with normal eye closure and smile. CN VIII: Hearing is normal to rubbing fingers CN IX, X: Palate elevates symmetrically. Phonation is normal. CN XI: Head turning and shoulder shrug are intact CN XII: Tongue is midline with normal movements and no atrophy.  MOTOR: She has mild bilateral hip flexion weakness,  REFLEXES: Reflexes are 3 and symmetric at the biceps, triceps, knees, and ankles. Plantar responses are flexor.  SENSORY: Length dependent decreased to light touch, pinprick and vibratory sensation at toes  COORDINATION: Rapid alternating movements and fine finger movements are intact. There is no dysmetria on finger-to-nose and heel-knee-shin.    GAIT/STANCE: She relies on her cane to get up from seated position, stiff, cautious unsteady gait   DIAGNOSTIC DATA (LABS, IMAGING, TESTING) - I reviewed patient records, labs, notes, testing and imaging myself where available.   ASSESSMENT AND PLAN  LUTIE PICKLER is a 57 y.o. female   Memory loss Gait abnormality  MRI of the brain showed mild premature generalized atrophy  Worsening depression will contributed to her current complaints of memory loss, I have suggested her continue work with her primary care physician and psychiatrist to optimize her depression control,  Laboratory evaluation to rule out treatable etiology  Her gait abnormality also multifactorial, this including decompression, previous cervical myelopathy/decompression surgery, radiation therapy to cervical region, check B12,  copper,  Continue moderate exercise   Marcial Pacas, M.D. Ph.D.  [facility] [address], [place], [place] [address] Ph: [phone] Fax: [phone]  CC: Maurice Small, MD

## 2018-09-21 ENCOUNTER — Telehealth: Payer: Self-pay | Admitting: Neurology

## 2018-09-21 ENCOUNTER — Encounter: Payer: Self-pay | Admitting: Neurology

## 2018-09-21 NOTE — Telephone Encounter (Signed)
Left detailed message with the information below (ok per DPR).  Provided our number to call back with any questions.

## 2018-09-21 NOTE — Telephone Encounter (Signed)
Please call patient, I have  canceled her MRI of cervical spine, I was able to review CT cervical region in February 2018, the multiple metal artifact from previous surgery will distort MRI signal

## 2018-09-22 ENCOUNTER — Telehealth: Payer: Self-pay | Admitting: Neurology

## 2018-09-22 LAB — IRON AND TIBC
Iron Saturation: 9 % — CL (ref 15–55)
Iron: 36 ug/dL (ref 27–159)
TIBC: 422 ug/dL (ref 250–450)
UIBC: 386 ug/dL (ref 131–425)

## 2018-09-22 LAB — COMPREHENSIVE METABOLIC PANEL
A/G RATIO: 1.6 (ref 1.2–2.2)
ALBUMIN: 4.4 g/dL (ref 3.5–5.5)
ALK PHOS: 154 IU/L — AB (ref 39–117)
ALT: 10 IU/L (ref 0–32)
AST: 11 IU/L (ref 0–40)
BUN / CREAT RATIO: 11 (ref 9–23)
BUN: 13 mg/dL (ref 6–24)
CO2: 23 mmol/L (ref 20–29)
CREATININE: 1.22 mg/dL — AB (ref 0.57–1.00)
Calcium: 10 mg/dL (ref 8.7–10.2)
Chloride: 98 mmol/L (ref 96–106)
GFR calc Af Amer: 56 mL/min/{1.73_m2} — ABNORMAL LOW (ref >59)
GFR calc non Af Amer: 49 mL/min/{1.73_m2} — ABNORMAL LOW (ref >59)
GLOBULIN, TOTAL: 2.8 g/dL (ref 1.5–4.5)
Glucose: 99 mg/dL (ref 65–99)
POTASSIUM: 4.7 mmol/L (ref 3.5–5.2)
SODIUM: 136 mmol/L (ref 134–144)
Total Protein: 7.2 g/dL (ref 6.0–8.5)

## 2018-09-22 LAB — CBC WITH DIFFERENTIAL
Basophils Absolute: 0.1 10*3/uL (ref 0.0–0.2)
Basos: 1 %
EOS (ABSOLUTE): 0.1 10*3/uL (ref 0.0–0.4)
Eos: 2 %
HEMATOCRIT: 36.8 % (ref 34.0–46.6)
Hemoglobin: 12.1 g/dL (ref 11.1–15.9)
Immature Grans (Abs): 0.1 10*3/uL (ref 0.0–0.1)
Immature Granulocytes: 1 %
Lymphocytes Absolute: 1.1 10*3/uL (ref 0.7–3.1)
Lymphs: 15 %
MCH: 27.9 pg (ref 26.6–33.0)
MCHC: 32.9 g/dL (ref 31.5–35.7)
MCV: 85 fL (ref 79–97)
MONOS ABS: 0.7 10*3/uL (ref 0.1–0.9)
Monocytes: 9 %
NEUTROS ABS: 5.5 10*3/uL (ref 1.4–7.0)
Neutrophils: 72 %
RBC: 4.34 x10E6/uL (ref 3.77–5.28)
RDW: 22.4 % — AB (ref 12.3–15.4)
WBC: 7.5 10*3/uL (ref 3.4–10.8)

## 2018-09-22 LAB — RPR: RPR Ser Ql: NONREACTIVE

## 2018-09-22 LAB — VITAMIN B12: Vitamin B-12: 555 pg/mL (ref 232–1245)

## 2018-09-22 LAB — ANA W/REFLEX: Anti Nuclear Antibody(ANA): NEGATIVE

## 2018-09-22 LAB — HIV ANTIBODY (ROUTINE TESTING W REFLEX): HIV Screen 4th Generation wRfx: NONREACTIVE

## 2018-09-22 LAB — FOLATE: Folate: <2 ng/mL — ABNORMAL LOW (ref >3.0)

## 2018-09-22 LAB — COPPER, SERUM: COPPER: 167 ug/dL — AB (ref 72–166)

## 2018-09-22 LAB — C-REACTIVE PROTEIN: CRP: 9 mg/L (ref 0–10)

## 2018-09-22 LAB — FERRITIN: Ferritin: 19 ng/mL (ref 15–150)

## 2018-09-22 NOTE — Telephone Encounter (Signed)
Spoke to patient - she is aware of her lab results and verbalized understanding.  She is agreeable to staying well hydrated and starting a folic acid 1mg  supplement daily.

## 2018-09-22 NOTE — Telephone Encounter (Signed)
Please call patient, lab evaluation showed mild abnormal creat, 1.22, GFR of 49,   Mild Low Folic acid,  She would benefit Folic acid supplement 1mg  daily, keep well hydration.  Rest of the laboratory evaluations showed no significant abnormalities.

## 2019-01-03 ENCOUNTER — Ambulatory Visit: Payer: Medicare Other | Admitting: Neurology

## 2019-01-03 ENCOUNTER — Telehealth: Payer: Self-pay | Admitting: *Deleted

## 2019-01-03 NOTE — Telephone Encounter (Signed)
The patient has been left two messages requesting a call back to reschedule her office visit.  We are happy to schedule a virtual visit with Dr. Krista Blue so that her care is not delayed.  Please get consent if she is interested in this option.  Otherwise, we can reschedule her follow up at a later date with Dr. Krista Blue.

## 2019-01-03 NOTE — Telephone Encounter (Signed)
Pt has trouble hearing and pts son called and stated they agree with the virtual visit but he needs to be present due to her not hearing well  CB# [phone]

## 2019-01-03 NOTE — Telephone Encounter (Signed)
Spoke with patient's son and got her scheduled for a virtual visit on 01/05/19. He is aware that he needs to download the Webex app and to click Join Meeting in the email at the scheduled appt time.   [email or web address]

## 2019-01-05 ENCOUNTER — Ambulatory Visit: Payer: Medicare Other | Admitting: Neurology

## 2019-01-05 ENCOUNTER — Other Ambulatory Visit: Payer: Self-pay

## 2019-01-05 NOTE — Telephone Encounter (Signed)
Pt has to cancel appt due to emergency surgery

## 2019-01-05 NOTE — Telephone Encounter (Signed)
ok 

## 2019-03-31 ENCOUNTER — Other Ambulatory Visit: Payer: Self-pay

## 2019-03-31 ENCOUNTER — Encounter: Payer: Self-pay | Admitting: Neurology

## 2019-03-31 ENCOUNTER — Telehealth (INDEPENDENT_AMBULATORY_CARE_PROVIDER_SITE_OTHER): Payer: Medicare Other | Admitting: Neurology

## 2019-03-31 DIAGNOSIS — R269 Unspecified abnormalities of gait and mobility: Secondary | ICD-10-CM

## 2019-03-31 DIAGNOSIS — F32A Depression, unspecified: Secondary | ICD-10-CM

## 2019-03-31 DIAGNOSIS — F329 Major depressive disorder, single episode, unspecified: Secondary | ICD-10-CM | POA: Diagnosis not present

## 2019-03-31 DIAGNOSIS — R413 Other amnesia: Secondary | ICD-10-CM | POA: Diagnosis not present

## 2019-03-31 NOTE — Progress Notes (Signed)
PATIENT: Stacey Carroll DOB: Dec 10, 1960  No chief complaint on file.    HISTORICAL  Stacey Carroll  is a 58 year old female, accompanied by her son Stacey Carroll, seen in request by her primary care physician Dr. Justin Mend, Arbie Cookey, for evaluation of memory loss, gait abnormality, initial evaluation was on September 21, 2018.  I have reviewed and summarized the referring note from the referring physician. I was able to review oncology note by Dr. Alvy Bimler in April 2016, she had oropharyngeal cancer, stage T3 N2C, M0, status post chemoradiation therapy in 2015, afterwards, she developed significant dysphagia, to go ENT procedure to stretch esophagus restriction, continue have significant swallowing difficulty, rely on daily ice cream and chocolate milk.  She used to work as an Geophysical data processor for [employer], triage phone calls, was not able to go back to her job since treatment, she can no longer keep up with her password to log in the computer system, memory loss gradually getting worse, especially concurrent with her worsening depression, after she has lost multiple family members and short period of time, she now lives alone, her only child Stacey Carroll lives behind her, check on her regularly,  Since chemo/radiation therapy, she also complains of gait abnormality, hearing loss, denies significant bowel and bladder incontinence.  We personally reviewed MRI of the brain without contrast in February 2019, mild generalized atrophy, complete opacification of sphenoid sinus, there was no acute abnormality.  CT of cervical spine in February 2018, congenital fusion of C2 and 3, anterior fusion C4-T1 with solid fusion, moderate left foraminal narrowing at C3-4,  Virtual Visit via Video  I connected with [name] on 03/31/19 at  by Video and verified that I am speaking with the correct person using two identifiers.   I discussed the limitations, risks, security and privacy concerns of performing  an evaluation and management service by video and the availability of in person appointments. I also discussed with the patient that there may be a patient responsible charge related to this service. The patient expressed understanding and agreed to proceed.   History of Present Illness: She can eat better now, she continues to complains of back pain, gait abnormalities.  She complains of dizziness, low back pain.  She is now on polypharmacy, taking wellbutrin XL 300mg  qhs, Lamotrigine 100mg  daily, Paxil 40mg  bid for her depression.    Laboratory evaluations in December 2019 showed elevated RDW on CBC, normal total iron-binding capacity, negative ANA, normal ferritin 19, B12, RPR, copper level, folic acid was less than 2, CMP showed elevated creatinine of 1.22, with GFR of 49  Observations/Objective: I have reviewed problem lists, medications, allergies.  Awake, alert, depressed looking fragile female, need to push up to get up from seated position, wide based, cautious, unsteady gait.  Assessment and Plan: Memory loss Gait abnormality  MRI of the brain showed mild premature generalized atrophy  Her memory loss likely due to combination of worsening depression, polypharmacy,   Laboratory evaluation showed decreased folic acid, she should continue folic acid supplement,  Her gait abnormality also multifactorial, this including decompression, previous cervical myelopathy/decompression surgery, radiation therapy to cervical region, deconditioning, chronic neck and low back pain  She does not want to do physical therapy at this point  Continue follow-up with her primary care and pain management  Follow Up Instructions:  As needed    I discussed the assessment and treatment plan with the patient. The patient was provided an opportunity to ask questions and  all were answered. The patient agreed with the plan and demonstrated an understanding of the instructions.   The patient was advised to  call back or seek an in-person evaluation if the symptoms worsen or if the condition fails to improve as anticipated.  I provided 25 minutes of non-face-to-face time during this encounter.   Marcial Pacas, MD  REVIEW OF SYSTEMS: Full 14 system review of systems performed and notable only for weight gain, cough, wheezing, hearing loss, trouble swallowing, memory loss, confusion, headaches, weakness, slurred speech, difficulty swallowing, sleepiness, restless leg, depression, anxiety, decreased energy disinterested in activities, racing thoughts. All other review of systems were negative.  ALLERGIES: Allergies  Allergen Reactions  . Penicillins Anaphylaxis and Other (See Comments)    From childhood  . Vancomycin Itching  . Barium Sulfate Itching and Rash    HOME MEDICATIONS: Current Outpatient Medications  Medication Sig Dispense Refill  . albuterol (ACCUNEB) 0.63 MG/3ML nebulizer solution Take 1 ampule by nebulization every 6 (six) hours as needed for wheezing.    Marland Kitchen amLODipine (NORVASC) 5 MG tablet Take 5 mg by mouth daily.    Marland Kitchen buPROPion (WELLBUTRIN XL) 300 MG 24 hr tablet Take 300 mg by mouth daily.    . diazepam (VALIUM) 10 MG tablet Take 10 mg by mouth 3 (three) times daily as needed for anxiety.     . fluticasone (FLONASE) 50 MCG/ACT nasal spray Place 1 spray into both nostrils daily as needed.     . folic acid (FOLVITE) 1 MG tablet Take 1 mg by mouth daily.    Marland Kitchen lamoTRIgine (LAMICTAL) 100 MG tablet Take 100 mg by mouth daily.    Marland Kitchen LEVOTHYROXINE SODIUM PO Take 100 mcg by mouth daily.     . pantoprazole (PROTONIX) 40 MG tablet Take 40 mg by mouth daily.    Marland Kitchen PARoxetine (PAXIL) 40 MG tablet Take 40 mg by mouth 2 (two) times daily.    . pravastatin (PRAVACHOL) 20 MG tablet Take 20 mg by mouth daily.    Marland Kitchen zolpidem (AMBIEN) 10 MG tablet Take 10 mg by mouth at bedtime as needed for sleep.     No current facility-administered medications for this visit.     PAST MEDICAL HISTORY: Past  Medical History:  Diagnosis Date  . Anxiety   . Arthritis   . Asthma   . Cancer ([place]) 2015   stage 4 throat cancer  . Cough   . Depression   . Dyslipidemia   . Dysphagia 04/27/2014  . Endometriosis   . Hard of hearing   . Headache(784.0)   . Hypercholesterolemia   . Hypertension    not taking meds  . Memory loss   . Nausea alone 05/31/2014  . PONV (postoperative nausea and vomiting)   . Radiation 05/24/14- 07/18/14   Vallecula, Epiglottis, and bilateral neck.   . S/P radiation therapy 05/24/2014-07/18/2014   7000  cGy/ Vallecula/neck  . Scoliosis   . Throat pain 04/19/2014  . Visual disturbance 03/11/2013    PAST SURGICAL HISTORY: Past Surgical History:  Procedure Laterality Date  . ABDOMINAL HYSTERECTOMY  1987  . CERVICAL LAMINECTOMY  2007 (?), 2011  . GASTROSTOMY N/A 05/18/2014   Procedure: GASTROSTOMY/PLACEMENT OF G-TUBE;  Surgeon: Rolm Bookbinder, MD;  Location: [facility];  Service: General;  Laterality: N/A;  . LARYNGOSCOPY AND ESOPHAGOSCOPY N/A 04/14/2014   Procedure: DIRECT LARYNGOSCOPY WITH BIOPSY  AND ESOPHAGOSCOPY;  Surgeon: Ruby Cola, MD;  Location: [facility];  Service: ENT;  Laterality: N/A;  . PORTACATH  PLACEMENT N/A 05/18/2014   Procedure: INSERTION PORT-A-CATH;  Surgeon: Rolm Bookbinder, MD;  Location: [facility];  Service: General;  Laterality: N/A;  . SHOULDER SURGERY Left   . TRACHEOSTOMY TUBE PLACEMENT N/A 05/18/2014   Procedure: TRACHEOSTOMY;  Surgeon: Melissa Montane, MD;  Location: [facility];  Service: ENT;  Laterality: N/A;    FAMILY HISTORY: Family History  Problem Relation Age of Onset  . Emphysema Mother   . Cancer Mother        throat ca  . Cancer Father        prostate ca  . Heart disease Father   . Breast cancer Neg Hx     SOCIAL HISTORY: Social History   Socioeconomic History  . Marital status: Single    Spouse name: Not on file  . Number of children: 1  . Years of education: 9th grade  . Highest education level: Not on file  Occupational History   . Occupation: Disabled  Social Needs  . Financial resource strain: Not on file  . Food insecurity    Worry: Not on file    Inability: Not on file  . Transportation needs    Medical: Not on file    Non-medical: Not on file  Tobacco Use  . Smoking status: Current Every Day Smoker    Packs/day: 0.50    Years: 39.00    Pack years: 19.50    Types: Cigarettes  . Smokeless tobacco: Never Used  Substance and Sexual Activity  . Alcohol use: Yes    Comment: occasional  . Drug use: No  . Sexual activity: Not on file  Lifestyle  . Physical activity    Days per week: Not on file    Minutes per session: Not on file  . Stress: Not on file  Relationships  . Social Herbalist on phone: Not on file    Gets together: Not on file    Attends religious service: Not on file    Active member of club or organization: Not on file    Attends meetings of clubs or organizations: Not on file    Relationship status: Not on file  . Intimate partner violence    Fear of current or ex partner: Not on file    Emotionally abused: Not on file    Physically abused: Not on file    Forced sexual activity: Not on file  Other Topics Concern  . Not on file  Social History Narrative   Lives at home alone.  Her son has a house next door (located on the same property).   Right-handed.   No caffeine use.     PHYSICAL EXAM   There were no vitals filed for this visit.  Not recorded      There is no height or weight on file to calculate BMI.  PHYSICAL EXAMNIATION:  Gen: NAD, conversant, well nourised, obese, well groomed                     Cardiovascular: Regular rate rhythm, no peripheral edema, warm, nontender. Eyes: Conjunctivae clear without exudates or hemorrhage Neck: Supple, no carotid bruits. Pulmonary: Clear to auscultation bilaterally   NEUROLOGICAL EXAM:  MMSE - Mini Mental State Exam 09/20/2018  Orientation to time 2  Orientation to Place 5  Registration 3  Attention/  Calculation 0  Recall 2  Language- name 2 objects 2  Language- repeat 0  Language- follow 3 step command 3  Language- read &  follow direction 1  Write a sentence 1  Copy design 1  Total score 20  animal naming 8.   CRANIAL NERVES: CN II: Visual fields are full to confrontation. Pupils are round equal and briskly reactive to light. CN III, IV, VI: extraocular movement are normal. No ptosis. CN V: Facial sensation is intact to pinprick in all 3 divisions bilaterally. Corneal responses are intact.  CN VII: Face is symmetric with normal eye closure and smile. CN VIII: Hearing is normal to rubbing fingers CN IX, X: Palate elevates symmetrically. Phonation is normal. CN XI: Head turning and shoulder shrug are intact CN XII: Tongue is midline with normal movements and no atrophy.  MOTOR: She has mild bilateral hip flexion weakness,  REFLEXES: Reflexes are 3 and symmetric at the biceps, triceps, knees, and ankles. Plantar responses are flexor.  SENSORY: Length dependent decreased to light touch, pinprick and vibratory sensation at toes  COORDINATION: Rapid alternating movements and fine finger movements are intact. There is no dysmetria on finger-to-nose and heel-knee-shin.    GAIT/STANCE: She relies on her cane to get up from seated position, stiff, cautious unsteady gait   DIAGNOSTIC DATA (LABS, IMAGING, TESTING) - I reviewed patient records, labs, notes, testing and imaging myself where available.   ASSESSMENT AND PLAN  BEZA STEPPE is a 58 y.o. female   Memory loss Gait abnormality  MRI of the brain showed mild premature generalized atrophy  Worsening depression will contributed to her current complaints of memory loss, I have suggested her continue work with her primary care physician and psychiatrist to optimize her depression control,  Laboratory evaluation to rule out treatable etiology  Her gait abnormality also multifactorial, this including decompression,  previous cervical myelopathy/decompression surgery, radiation therapy to cervical region, check B12, copper,  Continue moderate exercise   Marcial Pacas, M.D. Ph.D.  [facility] [address], [place], [place] [address] Ph: [phone] Fax: [phone]  CC: Maurice Small, MD

## 2019-06-10 ENCOUNTER — Telehealth: Payer: Self-pay | Admitting: Internal Medicine

## 2019-06-10 NOTE — Telephone Encounter (Signed)
Spoke with patient and patient's son, Rance Muir and both were in agreement with Palliative services.  I have scheduled a Telephone Consult for 06/17/19 @ 3:30 PM

## 2019-06-11 ENCOUNTER — Emergency Department (HOSPITAL_COMMUNITY)
Admission: EM | Admit: 2019-06-11 | Discharge: 2019-06-11 | Disposition: A | Discharge status: Home / Self Care | Payer: Medicare Other | Attending: Emergency Medicine | Admitting: Emergency Medicine

## 2019-06-11 ENCOUNTER — Emergency Department (HOSPITAL_COMMUNITY): Payer: Medicare Other

## 2019-06-11 ENCOUNTER — Other Ambulatory Visit: Payer: Self-pay

## 2019-06-11 DIAGNOSIS — Z79899 Other long term (current) drug therapy: Secondary | ICD-10-CM | POA: Insufficient documentation

## 2019-06-11 DIAGNOSIS — E039 Hypothyroidism, unspecified: Secondary | ICD-10-CM | POA: Insufficient documentation

## 2019-06-11 DIAGNOSIS — I10 Essential (primary) hypertension: Secondary | ICD-10-CM | POA: Diagnosis not present

## 2019-06-11 DIAGNOSIS — R109 Unspecified abdominal pain: Secondary | ICD-10-CM | POA: Diagnosis present

## 2019-06-11 DIAGNOSIS — J45909 Unspecified asthma, uncomplicated: Secondary | ICD-10-CM | POA: Insufficient documentation

## 2019-06-11 DIAGNOSIS — M79601 Pain in right arm: Secondary | ICD-10-CM | POA: Diagnosis not present

## 2019-06-11 DIAGNOSIS — F17210 Nicotine dependence, cigarettes, uncomplicated: Secondary | ICD-10-CM | POA: Diagnosis not present

## 2019-06-11 DIAGNOSIS — K5792 Diverticulitis of intestine, part unspecified, without perforation or abscess without bleeding: Secondary | ICD-10-CM

## 2019-06-11 LAB — URINALYSIS, ROUTINE W REFLEX MICROSCOPIC
Bilirubin Urine: NEGATIVE
Glucose, UA: NEGATIVE mg/dL
Ketones, ur: 5 mg/dL — AB
Leukocytes,Ua: NEGATIVE
Nitrite: NEGATIVE
Protein, ur: NEGATIVE mg/dL
Specific Gravity, Urine: 1.005 (ref 1.005–1.030)
pH: 5 (ref 5.0–8.0)

## 2019-06-11 LAB — CBC
HCT: 37 % (ref 36.0–46.0)
Hemoglobin: 12 g/dL (ref 12.0–15.0)
MCH: 27.8 pg (ref 26.0–34.0)
MCHC: 32.4 g/dL (ref 30.0–36.0)
MCV: 85.6 fL (ref 80.0–100.0)
Platelets: 448 10*3/uL — ABNORMAL HIGH (ref 150–400)
RBC: 4.32 MIL/uL (ref 3.87–5.11)
RDW: 18.8 % — ABNORMAL HIGH (ref 11.5–15.5)
WBC: 6.4 10*3/uL (ref 4.0–10.5)
nRBC: 0 % (ref 0.0–0.2)

## 2019-06-11 LAB — COMPREHENSIVE METABOLIC PANEL
ALT: 8 U/L (ref 0–44)
AST: 15 U/L (ref 15–41)
Albumin: 3.9 g/dL (ref 3.5–5.0)
Alkaline Phosphatase: 94 U/L (ref 38–126)
Anion gap: 15 (ref 5–15)
BUN: 12 mg/dL (ref 6–20)
CO2: 20 mmol/L — ABNORMAL LOW (ref 22–32)
Calcium: 9.1 mg/dL (ref 8.9–10.3)
Chloride: 100 mmol/L (ref 98–111)
Creatinine, Ser: 1.55 mg/dL — ABNORMAL HIGH (ref 0.44–1.00)
GFR calc Af Amer: 42 mL/min — ABNORMAL LOW (ref >60)
GFR calc non Af Amer: 36 mL/min — ABNORMAL LOW (ref >60)
Glucose, Bld: 89 mg/dL (ref 70–99)
Potassium: 3.3 mmol/L — ABNORMAL LOW (ref 3.5–5.1)
Sodium: 135 mmol/L (ref 135–145)
Total Bilirubin: 0.6 mg/dL (ref 0.3–1.2)
Total Protein: 7.1 g/dL (ref 6.5–8.1)

## 2019-06-11 LAB — LIPASE, BLOOD: Lipase: 20 U/L (ref 11–51)

## 2019-06-11 MED ORDER — MORPHINE SULFATE (PF) 4 MG/ML IV SOLN
4.0000 mg | Freq: Once | INTRAVENOUS | Status: AC
  Administered 2019-06-11: 18:00:00 4 mg via INTRAVENOUS
  Filled 2019-06-11: qty 1

## 2019-06-11 MED ORDER — SODIUM CHLORIDE 0.9 % IV BOLUS
1000.0000 mL | Freq: Once | INTRAVENOUS | Status: AC
  Administered 2019-06-11: 16:00:00 1000 mL via INTRAVENOUS

## 2019-06-11 MED ORDER — HYDROCODONE-ACETAMINOPHEN 5-325 MG PO TABS: 2.0000 | ORAL_TABLET | Freq: Four times a day (QID) | ORAL | 0 refills | Status: DC | PRN

## 2019-06-11 MED ORDER — METRONIDAZOLE 500 MG PO TABS: 500.0000 mg | ORAL_TABLET | Freq: Two times a day (BID) | ORAL | 0 refills | Status: DC

## 2019-06-11 MED ORDER — ONDANSETRON HCL 4 MG/2ML IJ SOLN
4.0000 mg | Freq: Once | INTRAMUSCULAR | Status: AC
  Administered 2019-06-11: 4 mg via INTRAVENOUS
  Filled 2019-06-11: qty 2

## 2019-06-11 MED ORDER — CIPROFLOXACIN HCL 500 MG PO TABS: 500.0000 mg | ORAL_TABLET | Freq: Two times a day (BID) | ORAL | 0 refills | Status: AC

## 2019-06-11 MED ORDER — MORPHINE SULFATE (PF) 4 MG/ML IV SOLN
4.0000 mg | Freq: Once | INTRAVENOUS | Status: AC
  Administered 2019-06-11: 15:00:00 4 mg via INTRAVENOUS
  Filled 2019-06-11: qty 1

## 2019-06-11 MED ORDER — ONDANSETRON HCL 4 MG PO TABS: 4.0000 mg | ORAL_TABLET | Freq: Three times a day (TID) | ORAL | 0 refills | Status: DC | PRN

## 2019-06-11 NOTE — Discharge Instructions (Signed)
Take antibiotics as prescribed.  Take entire course, even if your symptoms improve. Use Zofran as needed for nausea or vomiting. Use Tylenol as needed for mild to moderate pain.  Use Norco as needed for severe breakthrough pain.  Have caution, this may make you tired or groggy.  Do not drive or operate heavy machinery while taking this medicine. Continue taking home medications as prescribed. Your x-ray today was negative, there is no sign of fracture or bony abnormality of your arm. Return to the emergency room if you develop high fevers, persistent vomiting despite medication, severe worsening pain, or if your symptoms not proving with this treatment in several days.

## 2019-06-11 NOTE — ED Provider Notes (Signed)
[facility] EMERGENCY DEPARTMENT Provider Note   CSN: [id number] Arrival date & time: 06/11/19  1309     History   Chief Complaint Chief Complaint  Patient presents with  . Abdominal Pain    HPI Stacey Carroll is a 58 y.o. female presenting for evaluation of nausea, vomiting, abdominal pain, and diarrhea.  Patient states the past 2 days, she has not been feeling well.  She reports multiple episodes of emesis, unable to quantify.  No hematemesis.  She reports abdominal pain which is mostly in her lower abdomen bilaterally, though she did have sharp pain in her right upper quadrant earlier today.  Patient states she is having mild dysuria with urination.  Also reporting 3 days of diarrhea, although feels like she has a hard stool ball stuck in her rectum.  She reports a history of constipation, often needs to do a self fecal disimpaction in order to relieve herself.  She denies fevers, chills, chest pain, shortness of breath.  She has not taken anything for her symptoms. Patient also reports on her right side several days ago.  She reports pain in her humerus.  She is concerned that it might be broken.  No numbness or tingling.  pain is worse with movement and palpation.  Additional history obtained from chart review.  Patient with a history of throat cancer status post treatment.  History of memory issues and ataxia.  History of dysphagia, patient states she eats ice cream and chocolate milk because she is unable to tolerate solids.     HPI  Past Medical History:  Diagnosis Date  . Anxiety   . Arthritis   . Asthma   . Cancer ([place]) 2015   stage 4 throat cancer  . Cough   . Depression   . Dyslipidemia   . Dysphagia 04/27/2014  . Endometriosis   . Hard of hearing   . Headache(784.0)   . Hypercholesterolemia   . Hypertension    not taking meds  . Memory loss   . Nausea alone 05/31/2014  . PONV (postoperative nausea and vomiting)   . Radiation 05/24/14- 07/18/14    Vallecula, Epiglottis, and bilateral neck.   . S/P radiation therapy 05/24/2014-07/18/2014   7000  cGy/ Vallecula/neck  . Scoliosis   . Throat pain 04/19/2014  . Visual disturbance 03/11/2013    Patient Active Problem List   Diagnosis Date Noted  . Gait abnormality 09/20/2018  . Memory loss 09/20/2018  . Leukocytosis 07/16/2016  . Hypothyroidism 07/16/2016  . GERD (gastroesophageal reflux disease) 07/16/2016  . Depression 07/16/2016  . Supraglottic edema 07/13/2016  . Bilateral hearing loss 01/17/2015  . Preventive measure 06/14/2014  . Anemia in neoplastic disease 06/14/2014  . Nausea without vomiting 05/31/2014  . S/P percutaneous endoscopic gastrostomy (PEG) tube placement ([place]) 05/24/2014  . Tracheostomy status ([place]) 05/24/2014  . Protein-calorie malnutrition, severe ([place]) 05/19/2014  . Dysphagia 04/27/2014  . 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 ABDOMINAL HYSTERECTOMY  1987  . CERVICAL LAMINECTOMY  2007 (?), 2011  . GASTROSTOMY N/A 05/18/2014   Procedure: GASTROSTOMY/PLACEMENT OF G-TUBE;  Surgeon: Rolm Bookbinder, MD;  Location: [facility];  Service: General;  Laterality: N/A;  . LARYNGOSCOPY AND ESOPHAGOSCOPY N/A 04/14/2014   Procedure: DIRECT LARYNGOSCOPY WITH BIOPSY  AND ESOPHAGOSCOPY;  Surgeon: Ruby Cola, MD;  Location: [facility];  Service: ENT;  Laterality: N/A;  . PORTACATH PLACEMENT N/A 05/18/2014   Procedure: INSERTION PORT-A-CATH;  Surgeon: Rolm Bookbinder, MD;  Location: [facility];  Service: General;  Laterality: N/A;  . SHOULDER SURGERY Left   . TRACHEOSTOMY TUBE PLACEMENT N/A 05/18/2014   Procedure: TRACHEOSTOMY;  Surgeon: Melissa Montane, MD;  Location: [facility];  Service: ENT;  Laterality: N/A;     OB History   No obstetric history on file.      Home Medications    Prior to Admission  medications   Medication Sig Start Date End Date Taking? Authorizing Provider  albuterol (ACCUNEB) 0.63 MG/3ML nebulizer solution Take 1 ampule by nebulization every 6 (six) hours as needed for wheezing.    [provider]  amLODipine (NORVASC) 5 MG tablet Take 5 mg by mouth daily.    [provider]  buPROPion (WELLBUTRIN XL) 300 MG 24 hr tablet Take 300 mg by mouth daily.    [provider]  ciprofloxacin (CIPRO) 500 MG tablet Take 1 tablet (500 mg total) by mouth every 12 (twelve) hours for 7 days. 06/11/19 06/18/19  Ryah Cribb, PA-C  diazepam (VALIUM) 10 MG tablet Take 10 mg by mouth 3 (three) times daily as needed for anxiety.     [provider]  fluticasone (FLONASE) 50 MCG/ACT nasal spray Place 1 spray into both nostrils daily as needed.     [provider]  folic acid (FOLVITE) 1 MG tablet Take 1 mg by mouth daily.    [provider]  HYDROcodone-acetaminophen (NORCO/VICODIN) 5-325 MG tablet Take 2 tablets by mouth every 6 (six) hours as needed for severe pain. 06/11/19   Jaryan Chicoine, PA-C  lamoTRIgine (LAMICTAL) 100 MG tablet Take 100 mg by mouth daily.    [provider]  LEVOTHYROXINE SODIUM PO Take 100 mcg by mouth daily.     [provider]  metroNIDAZOLE (FLAGYL) 500 MG tablet Take 1 tablet (500 mg total) by mouth 2 (two) times daily. 06/11/19   Carys Malina, PA-C  ondansetron (ZOFRAN) 4 MG tablet Take 1 tablet (4 mg total) by mouth every 8 (eight) hours as needed for nausea or vomiting. 06/11/19   Rhiannon Sassaman, PA-C  pantoprazole (PROTONIX) 40 MG tablet Take 40 mg by mouth daily.    [provider]  PARoxetine (PAXIL) 40 MG tablet Take 40 mg by mouth 2 (two) times daily.    [provider]  pravastatin (PRAVACHOL) 20 MG tablet Take 20 mg by mouth daily.    [provider]  zolpidem (AMBIEN) 10 MG tablet Take 10 mg by mouth at bedtime as needed for sleep.    [provider]    Family History Family History  Problem Relation Age of Onset  . Emphysema Mother   . Cancer Mother        throat ca  . Cancer Father        prostate ca  . Heart disease Father   . Breast cancer Neg Hx     Social History Social History   Tobacco Use  . Smoking status: Current Every Day Smoker    Packs/day: 0.50    Years: 39.00    Pack years: 19.50    Types: Cigarettes  . Smokeless tobacco: Never Used  Substance Use Topics  . Alcohol use: Yes    Comment: occasional  . Drug use: No     Allergies   Penicillins, Vancomycin, and Barium sulfate   Review of Systems Review of Systems  Gastrointestinal: Positive for abdominal pain, constipation, diarrhea, nausea and vomiting.  Genitourinary: Positive for dysuria.  All other systems reviewed and are negative.    Physical Exam Updated Vital Signs BP 131/82 (BP Location: Left Arm)   Pulse 82   Temp 98.1 F (36.7 C) (Oral)   Resp 18   Ht 5\' 5"  (1.651 m)   Wt 70.1 kg   SpO2 98%   BMI 25.72 kg/m   Physical Exam Vitals signs and nursing note reviewed.  Constitutional:      Comments: Appears chronically ill  HENT:     Head: Normocephalic and atraumatic.  Eyes:     Extraocular Movements: Extraocular movements intact.     Conjunctiva/sclera: Conjunctivae normal.     Pupils: Pupils are equal, round, and reactive to light.  Neck:     Musculoskeletal: Normal range of motion and neck supple.  Cardiovascular:     Rate and Rhythm: Normal rate and regular rhythm.     Pulses: Normal pulses.  Pulmonary:     Effort: Pulmonary effort is normal. No respiratory distress.     Breath sounds: Normal breath sounds. No wheezing.  Abdominal:     General: There is no distension.     Palpations: Abdomen is soft.     Tenderness: There is generalized abdominal tenderness and tenderness in the suprapubic area. There is no right CVA tenderness or left CVA tenderness.     Comments: Generalized tenderness palpation  the abdomen, worse in the suprapubic region.  No rigidity, guarding, distention.  Negative rebound.  Musculoskeletal: Normal range of motion.     Comments: No obvious deformity of the right arm.  No focal tenderness.  No swelling.  Full active range of motion of the elbow and wrist without difficulty.  Radial pulses intact.  Skin:    General: Skin is warm and dry.     Capillary Refill: Capillary refill takes less than 2 seconds.  Neurological:     Mental Status: She is alert and oriented to person, place, and time.      ED Treatments / Results  Labs (all labs ordered are listed, but only abnormal results are displayed) Labs Reviewed  COMPREHENSIVE METABOLIC PANEL - Abnormal; Notable for the following components:      Result Value   Potassium 3.3 (*)    CO2 20 (*)    Creatinine, Ser 1.55 (*)    GFR calc non Af Amer 36 (*)    GFR calc Af Amer 42 (*)    All other components within normal limits  CBC - Abnormal; Notable for the following components:   RDW 18.8 (*)    Platelets 448 (*)    All other components within normal limits  URINALYSIS, ROUTINE W REFLEX MICROSCOPIC - Abnormal; Notable for the following components:   Color, Urine STRAW (*)    Hgb urine dipstick SMALL (*)    Ketones, ur 5 (*)    Bacteria, UA RARE (*)    All other components within normal limits  URINE CULTURE  LIPASE, BLOOD    EKG None  Radiology Ct Abdomen Pelvis Wo Contrast  Result Date: 06/11/2019 CLINICAL DATA:  Worsening abdominal pain. EXAM: CT ABDOMEN AND PELVIS WITHOUT CONTRAST TECHNIQUE: Multidetector CT imaging of the abdomen and pelvis was performed following the standard protocol without IV contrast. COMPARISON:  May 24, 2015, report only FINDINGS: Lower chest: No acute abnormality. Hepatobiliary: No focal liver abnormality is seen. No gallstones, gallbladder wall thickening, or biliary dilatation. Pancreas: Unremarkable. No  pancreatic ductal dilatation or surrounding inflammatory changes.  Spleen: Normal in size without focal abnormality. Adrenals/Urinary Tract: Adrenal glands are unremarkable. Kidneys are normal, without renal calculi, focal lesion, or hydronephrosis. Bladder is unremarkable. Stomach/Bowel: The stomach is decompressed and therefore not well evaluated. No evidence of small-bowel obstruction. Normal appendix. Diffuse colonic diverticulosis. Long segment of mucosal thickening of the sigmoid colon and rectum with pericolonic fat stranding. No evidence of abscess formation or macro perforation. Vascular/Lymphatic: Aortic atherosclerosis. No enlarged abdominal or pelvic lymph nodes. Ectasia of the abdominal aorta. Mild fusiform dilation of the distal abdominal aorta measuring 2.4 cm in maximum diameter. Reproductive: Status post hysterectomy. No adnexal masses. Other: No abdominal wall hernia or abnormality. No abdominopelvic ascites. Musculoskeletal: Spondylosis of the lumbosacral spine. IMPRESSION: 1. Diffuse colonic diverticulosis. 2. Long segment of mucosal thickening of the sigmoid colon and rectum with pericolonic fat stranding, most consistent with acute diverticulitis or other form of colitis. No evidence of abscess formation or macro perforation. 3. Ectasia of the abdominal aorta with mild fusiform dilation of the distal abdominal aorta measuring 2.4 cm in maximum diameter. Ectatic abdominal aorta at risk for aneurysm development. Recommend followup by ultrasound in 5 years. This recommendation follows ACR consensus guidelines: White Paper of the ACR Incidental Findings Committee II on Vascular Findings. J Am Coll Radiol 2013; 10:789-794. Aortic aneurysm NOS (ICD10-I71.9) Aortic Atherosclerosis (ICD10-I70.0). Electronically Signed   By: Fidela Salisbury M.D.   On: 06/11/2019 14:55   Dg Humerus Right  Result Date: 06/11/2019 CLINICAL DATA:  Fall EXAM: RIGHT HUMERUS - 2+ VIEW COMPARISON:  None FINDINGS: Slight osseous demineralization. Shoulder and elbow joint alignments  normal. No acute fracture, dislocation, or bone destruction. IMPRESSION: No acute osseous abnormalities. Electronically Signed   By: Lavonia Dana M.D.   On: 06/11/2019 17:37    Procedures Procedures (including critical care time)  Medications Ordered in ED Medications  ondansetron (ZOFRAN) injection 4 mg (4 mg Intravenous Given 06/11/19 1516)  morphine 4 MG/ML injection 4 mg (4 mg Intravenous Given 06/11/19 1517)  sodium chloride 0.9 % bolus 1,000 mL (0 mLs Intravenous Stopped 06/11/19 1802)  morphine 4 MG/ML injection 4 mg (4 mg Intravenous Given 06/11/19 1753)     Initial Impression / Assessment and Plan / ED Course  I have reviewed the triage vital signs and the nursing notes.  Pertinent labs & imaging results that were available during my care of the patient were reviewed by me and considered in my medical decision making (see chart for details).        Patient presenting for evaluation of nausea, vomiting, abdominal pain, diarrhea.  Also reporting right arm pain.  Physical exam reassuring, she appears nontoxic.  She does have generalized tenderness palpation the abdomen, worse in the suprapubic region.  Also reporting urinary symptoms.  Concern for possible UTI.  However, patient with significant history including throat cancer and subsequent difficulties with swallowing.  Will obtain labs and CT for further evaluation.  Zofran, morphine, and fluids for symptom control.  Labs show mild AKI with creatinine of 1.5, although creatinine has been trending up over the past several visits.  No leukocytosis.  Liver and pancreatic function normal.  CT pending.  CT shows diverticulitis without other acute findings in the abdomen.  No obvious abscess or perf.   UA without obvious infection, will send for culture.  X-ray viewed interpreted by me, no fracture or dislocation.  Discussed findings with patient and son.  Discussed treatment for diverticulitis with antibiotics, pain control,  nausea  control.  Encouraged monitoring for worsening of symptoms, and prompt return if symptoms worsen.  At this time, patient appears safe for discharge.  Return precautions given.  Patient states she understands and agrees to plan.   Final Clinical Impressions(s) / ED Diagnoses   Final diagnoses:  Diverticulitis  Left arm pain    ED Discharge Orders         Ordered    HYDROcodone-acetaminophen (NORCO/VICODIN) 5-325 MG tablet  Every 6 hours PRN     06/11/19 1646    ciprofloxacin (CIPRO) 500 MG tablet  Every 12 hours     06/11/19 1646    metroNIDAZOLE (FLAGYL) 500 MG tablet  2 times daily     06/11/19 1646    ondansetron (ZOFRAN) 4 MG tablet  Every 8 hours PRN     06/11/19 1646           Ori Kreiter, PA-C 06/11/19 1857    Gareth Morgan, MD 06/14/19 2349

## 2019-06-11 NOTE — ED Notes (Signed)
Patient transported to X-ray 

## 2019-06-11 NOTE — ED Notes (Signed)
Patient Alert and oriented to baseline. Stable and ambulatory to baseline. Patient verbalized understanding of the discharge instructions.  Patient belongings were taken by the patient.   

## 2019-06-11 NOTE — ED Triage Notes (Signed)
Pt to ER for lower abdominal pain worsening over the last 3 days. States intermittent diarrhea and constipation with nausea. VSS.

## 2019-06-12 LAB — URINE CULTURE
Culture: <10000 — AB
Special Requests: NORMAL

## 2019-06-17 ENCOUNTER — Other Ambulatory Visit: Payer: Medicare Other | Admitting: Internal Medicine

## 2019-06-17 ENCOUNTER — Other Ambulatory Visit: Payer: Self-pay

## 2019-06-17 DIAGNOSIS — Z515 Encounter for palliative care: Secondary | ICD-10-CM

## 2019-06-17 NOTE — Progress Notes (Signed)
Sept 10th, 2020 [facility] Consult Note Telephone: [phone]  Fax: [phone]  Due to the current COVID-19 infection/crises, the family prefer, and have given their verbal consent for, a provider visit via telemedicine. HIPPA policies of confidentially were discussed. Video-audio (telehealth) contact was unable to be done due technical barriers from the patient's side. PATIENT NAME: Stacey Carroll DOB: 07-31-1961 MRN: [id number] [address] [place] Phone: [phone]   PRIMARY CARE PROVIDER:   Maurice Small, MD  REFERRING PROVIDER:  Maurice Small, MD [address], [place] [address]  RESPONSIBLE PARTY: (son) Stacey Carroll [phone] **CALL THIS NUMBER FOR CONSULT**  ASSESSMENT / RECOMMENDATIONS:  1. Advance Care Planning: A. Directives: Wishes DNR (discussed today with patient in presence of son Stacey Carroll. -I'll sign the form, and mail two copies to the home. Advised patient to place one copy on the fridge and carry other with her when she leaves the home. I'll upload into [facility] EMR. I introduced aspects of the MOST form; tabled further discussion till another date.   B. Goals of Care: To increase her strength so she can continue to maintain her mobility. Hoping to obtain a wheelchair and somehow get a wheelchair ramp so she can occasionally go outside independently. Currently it's quite a chore for her son to assist her down the stairs. She wishes she could get a set of dentures and be evaluated for hearing aids. Patient has applied for Medicaid in the past but been refused d/t income supersedes threshold.  2. Symptom Management: Feels tired. No further diarrhea; a little nocturnal abdominal pain.   -Ask Dr. Justin Mend for PT consult.  3.Cognitive / Functional status: Patient is alert and oriented but VERY hard of hearing. She needs some assist with showering and occasionally with wash up. She is  independent in transfers and dressing. She is continent of bowel and bladder. She walks with occasional use of walker, mostly use of cane. with She is weak, spending most of her day in bed. She has an unsteady gait attributed to ataxia from prior chemo treatments. She trips easily. Her last fall was about 2 weeks ago. H/O dysphagia (sequela of past treatment of neck ca); she chokes on her food if eats too fast. She can tolerate soft foods. She feels her diet is adequate. Current weight is 154lbs. At a height of 5'5" her BMI is 25.7 kg/m2. Has episodic R shoulder pain and feels like her shoulder is "popping". She had an X-ray during recent ER visit that was unremarkable. She is considering f/u with orthopedic physician.  C/O constipation; moves bowels about q3d with some difficulty. Occasional use of Ex-Lax. Insomnia managed with prn Ambien. She is a current smoker.  -Recommended daily use of OTC Senokot (store brand fine) 8.6mg  1-2 tabs qd to bid; titrate to response. -Will ask PCP for PT consult with [facility]  4. Family Supports: Only child Stacey Carroll lives behind home of patient in refurbished garage. He is supportive and involved in care. Mentioned "my mom is never going to be in a nursing home". Patient is single. She enjoys flowers. She gets enjoyment from her 2 little dogs (Yorkie and a Armed forces logistics/support/administrative officer).  5. Follow up Palliative Care Visit: Tues Nov 3 @3 :30pm telehealth (audio only per pt request) -Will check community resources for wheelchair ramp/wheelchair -mail out DNR to home I spent 60 minutes providing this consultation from 3:30pm to 4:30pm. More than 50% of the time in this  consultation was spent coordinating communication.   HISTORY OF PRESENT ILLNESS:  Stacey Carroll is a 58 y.o. female with h/o throat cancer (2015), dysphagia, memory issues, ataxia, asthma, depression, dyslipidemia, HTN, and hypothyroidism.  ER visit 9/5/202 for nausea, vomiting, abdominal pain, and diarrhea. CT: diverticulitis. Cipro,  Flagyl, Zofran and Hydorcodone-acetaminophen 5-325 q6.  Palliative Care was asked to help address goals of care.   CODE STATUS: DNR (established today)  PPS: 40%  HOSPICE ELIGIBILITY/DIAGNOSIS: TBD  PAST MEDICAL HISTORY:  Past Medical History:  Diagnosis Date  . Anxiety   . Arthritis   . Asthma   . Cancer ([place]) 2015   stage 4 throat cancer  . Cough   . Depression   . Dyslipidemia   . Dysphagia 04/27/2014  . Endometriosis   . Hard of hearing   . Headache(784.0)   . Hypercholesterolemia   . Hypertension    not taking meds  . Memory loss   . Nausea alone 05/31/2014  . PONV (postoperative nausea and vomiting)   . Radiation 05/24/14- 07/18/14   Vallecula, Epiglottis, and bilateral neck.   . S/P radiation therapy 05/24/2014-07/18/2014   7000  cGy/ Vallecula/neck  . Scoliosis   . Throat pain 04/19/2014  . Visual disturbance 03/11/2013    SOCIAL HX:  Social History   Tobacco Use  . Smoking status: Current Every Day Smoker    Packs/day: 0.50    Years: 39.00    Pack years: 19.50    Types: Cigarettes  . Smokeless tobacco: Never Used  Substance Use Topics  . Alcohol use: Yes    Comment: occasional    ALLERGIES:  Allergies  Allergen Reactions  . Penicillins Anaphylaxis and Other (See Comments)    From childhood  . Vancomycin Itching  . Barium Sulfate Itching and Rash     PERTINENT MEDICATIONS:  Outpatient Encounter Medications as of 06/17/2019  Medication Sig  . albuterol (ACCUNEB) 0.63 MG/3ML nebulizer solution Take 1 ampule by nebulization every 6 (six) hours as needed for wheezing.  Marland Kitchen amLODipine (NORVASC) 5 MG tablet Take 5 mg by mouth daily.  Marland Kitchen buPROPion (WELLBUTRIN XL) 300 MG 24 hr tablet Take 300 mg by mouth daily.  . ciprofloxacin (CIPRO) 500 MG tablet Take 1 tablet (500 mg total) by mouth every 12 (twelve) hours for 7 days.  . diazepam (VALIUM) 10 MG tablet Take 10 mg by mouth 3 (three) times daily as needed for anxiety.   . fluticasone (FLONASE) 50 MCG/ACT  nasal spray Place 1 spray into both nostrils daily as needed.   . folic acid (FOLVITE) 1 MG tablet Take 1 mg by mouth daily.  Marland Kitchen HYDROcodone-acetaminophen (NORCO/VICODIN) 5-325 MG tablet Take 2 tablets by mouth every 6 (six) hours as needed for severe pain.  Marland Kitchen lamoTRIgine (LAMICTAL) 100 MG tablet Take 100 mg by mouth daily.  Marland Kitchen LEVOTHYROXINE SODIUM PO Take 100 mcg by mouth daily.   . metroNIDAZOLE (FLAGYL) 500 MG tablet Take 1 tablet (500 mg total) by mouth 2 (two) times daily.  . ondansetron (ZOFRAN) 4 MG tablet Take 1 tablet (4 mg total) by mouth every 8 (eight) hours as needed for nausea or vomiting.  . pantoprazole (PROTONIX) 40 MG tablet Take 40 mg by mouth daily.  Marland Kitchen PARoxetine (PAXIL) 40 MG tablet Take 40 mg by mouth 2 (two) times daily.  . pravastatin (PRAVACHOL) 20 MG tablet Take 20 mg by mouth daily.  Marland Kitchen zolpidem (AMBIEN) 10 MG tablet Take 10 mg by mouth at bedtime as needed for  sleep.   No facility-administered encounter medications on file as of 06/17/2019.     PHYSICAL EXAM:   PE deferred d/u audio only nature of telehealth visit. Son Stacey Carroll present during visit. Patient is very [place]. She is alert and pleasantly conversant.  Julianne Handler, NP

## 2019-06-19 ENCOUNTER — Encounter: Payer: Self-pay | Admitting: Internal Medicine

## 2019-10-13 ENCOUNTER — Other Ambulatory Visit: Payer: Self-pay | Admitting: Family Medicine

## 2019-10-25 ENCOUNTER — Other Ambulatory Visit: Payer: Medicare Other | Admitting: Internal Medicine

## 2019-10-25 DIAGNOSIS — Z515 Encounter for palliative care: Secondary | ICD-10-CM

## 2019-10-25 DIAGNOSIS — Z7189 Other specified counseling: Secondary | ICD-10-CM

## 2019-10-25 NOTE — Progress Notes (Signed)
Jan 19th, 2021 [facility] Consult Note Telephone: [phone]  Fax: [phone]   Due to the current COVID-19 infection/crises, the family prefer, and have given their verbal consent for, a provider visit via telemedicine. HIPPA policies of confidentially were discussed. Video-audio (telehealth) contact was unable to be done due technical barriers from the patient's side. PATIENT NAME: Stacey Carroll DOB: November 12, 1960 MRN: [id number] [address] [place] Phone: [phone]    PRIMARY CARE PROVIDER:   Maurice Small, MD   REFERRING PROVIDER:  Maurice Small, MD Stacey Carroll [address] [place], [place] [address] Stacey Carroll (Psych)   RESPONSIBLE PARTY: (son) Stacey Carroll [id number] **CALL THIS NUMBER FOR CONSULT** Getting a little worse stumbling around sluring speech. Choking abit more. Hearing aids.    ASSESSMENT / RECOMMENDATIONS:  1. Advance Care Planning: A. Directives: DNR forms in the home, but son has them "hidden" in case he wishes to change his mind regarding code status.  On a prior visit I had introduced aspects of the MOST form; tabled further discussion till another date.   B. Goals of Care: To increase her strength so she can continue to maintain her mobility. I dropped off a donated wheelchair for patient use, but it was too wide to fit through the narrow doorways. Son hoping to acquire a rollator walker. I'll give one of the local Churches a call to see if they have one they might be able to donate. Son also wishes to assist patient in making out her will. I passed onto him the web site/phone contact number for the [employer] [facility]   [employer]. This agency provides free legal help for [place] aged 47 years or older. T oll-free number: 763-303-2473.   Son upset that he cannot obtain assistance in obtaining hearing aides for patient.  Patient is not Medicaid eligible d/t income supersedes threshold.     2. Cognitive / Functional status Son reports patient improved stamina with initiation of Ritalin a few months earlier. But patient stumbles about home and is slurring her speech a little more. Her gait is unsteady from s/e of prior chemo treatments. Her breathing is improved as son has move himself and his dog form his mom's home. Son believes his mom was allergic to the dog dander. Son still visits frequently; his home is on the same property. Stacey Carroll is hoping for a PT consult; I'll ask Dr. Justin Mend for a referral. Stacey Carroll reports his mom's weight is stable. Chokes easily on food if she rushes, d/t prior treatments for throat cancer. Continues constipation; moving bowels about q3d. We again discussed use of OTC Senna 8.6mg  1-2 tabs qd to bid; titrate to response. Insomnia managed with prn Ambien. She has decreased her cigarette use to about 3/day.   3. Family Supports: Only child Stacey Carroll lives behind home of patient in refurbished garage. He is supportive and involved in care. Mentioned "my mom is never going to be in a nursing home". Patient is single. She enjoys flowers. She gets enjoyment from her 2 little dogs (Yorkie and a Armed forces logistics/support/administrative officer).    4. Follow up Palliative Care Visit: need to call to schedule to be seen in 2-3 months.  I spent 60 minutes providing this consultation from 4pm to 5pm. More than 50% of the time in this consultation was spent coordinating communication.    HISTORY OF PRESENT ILLNESS:  Stacey Carroll is a 59 y.o. female with h/o throat cancer (  2015), dysphagia, memory issues, ataxia, asthma, depression, dyslipidemia, HTN, and hypothyroidism.  ER visit 9/5/202 for nausea, vomiting, abdominal pain, and diarrhea. CT: diverticulitis. Cipro, Flagyl, Zofran and Hydorcodone-acetaminophen 5-325 q6.  This is a follow up Palliative Care visit from Sept 11th.   PAST MEDICAL HISTORY:  Past Medical History:  Diagnosis Date  . Anxiety     . Arthritis   . Asthma   . Cancer ([place]) 2015   stage 4 throat cancer  . Cough   . Depression   . Dyslipidemia   . Dysphagia 04/27/2014  . Endometriosis   . Hard of hearing   . Headache(784.0)   . Hypercholesterolemia   . Hypertension    not taking meds  . Memory loss   . Nausea alone 05/31/2014  . PONV (postoperative nausea and vomiting)   . Radiation 05/24/14- 07/18/14   Vallecula, Epiglottis, and bilateral neck.   . S/P radiation therapy 05/24/2014-07/18/2014   7000  cGy/ Vallecula/neck  . Scoliosis   . Throat pain 04/19/2014  . Visual disturbance 03/11/2013    SOCIAL HX:  Social History   Tobacco Use  . Smoking status: Current Every Day Smoker    Packs/day: 0.50    Years: 39.00    Pack years: 19.50    Types: Cigarettes  . Smokeless tobacco: Never Used  Substance Use Topics  . Alcohol use: Yes    Comment: occasional    ALLERGIES:  Allergies  Allergen Reactions  . Penicillins Anaphylaxis and Other (See Comments)    From childhood  . Vancomycin Itching  . Barium Sulfate Itching and Rash     PERTINENT MEDICATIONS:  Outpatient Encounter Medications as of 10/25/2019  Medication Sig  . albuterol (ACCUNEB) 0.63 MG/3ML nebulizer solution Take 1 ampule by nebulization every 6 (six) hours as needed for wheezing.  Marland Kitchen amLODipine (NORVASC) 5 MG tablet Take 5 mg by mouth daily.  Marland Kitchen buPROPion (WELLBUTRIN XL) 300 MG 24 hr tablet Take 300 mg by mouth daily.  . diazepam (VALIUM) 10 MG tablet Take 10 mg by mouth 3 (three) times daily as needed for anxiety.   . fluticasone (FLONASE) 50 MCG/ACT nasal spray Place 1 spray into both nostrils daily as needed.   . folic acid (FOLVITE) 1 MG tablet Take 1 mg by mouth daily.  Marland Kitchen HYDROcodone-acetaminophen (NORCO/VICODIN) 5-325 MG tablet Take 2 tablets by mouth every 6 (six) hours as needed for severe pain.  Marland Kitchen lamoTRIgine (LAMICTAL) 100 MG tablet Take 100 mg by mouth daily.  Marland Kitchen LEVOTHYROXINE SODIUM PO Take 100 mcg by mouth daily.   . ondansetron  (ZOFRAN) 4 MG tablet Take 1 tablet (4 mg total) by mouth every 8 (eight) hours as needed for nausea or vomiting.  . pantoprazole (PROTONIX) 40 MG tablet Take 40 mg by mouth daily.  Marland Kitchen PARoxetine (PAXIL) 40 MG tablet Take 40 mg by mouth 2 (two) times daily.  . pravastatin (PRAVACHOL) 20 MG tablet Take 20 mg by mouth daily.  Marland Kitchen zolpidem (AMBIEN) 10 MG tablet Take 10 mg by mouth at bedtime as needed for sleep.   No facility-administered encounter medications on file as of 10/25/2019.    PHYSICAL EXAM:   PE deferred d/u audio only nature of telehealth visit. Son Stacey Carroll present during visit. Patient is very [place]. She is alert and pleasantly conversant.  Julianne Handler, NP

## 2019-10-26 ENCOUNTER — Other Ambulatory Visit: Payer: Self-pay

## 2019-12-13 ENCOUNTER — Other Ambulatory Visit: Payer: Self-pay | Admitting: Pain Medicine

## 2019-12-13 DIAGNOSIS — G8929 Other chronic pain: Secondary | ICD-10-CM

## 2019-12-27 ENCOUNTER — Other Ambulatory Visit: Payer: Self-pay

## 2019-12-27 ENCOUNTER — Ambulatory Visit
Admission: RE | Admit: 2019-12-27 | Discharge: 2019-12-27 | Disposition: A | Discharge status: Home / Self Care | Payer: Medicare Other | Source: Ambulatory Visit | Attending: Pain Medicine | Admitting: Pain Medicine

## 2019-12-27 DIAGNOSIS — G8929 Other chronic pain: Secondary | ICD-10-CM

## 2019-12-27 DIAGNOSIS — M545 Low back pain, unspecified: Secondary | ICD-10-CM

## 2020-01-02 ENCOUNTER — Emergency Department (HOSPITAL_COMMUNITY)
Admission: EM | Admit: 2020-01-02 | Discharge: 2020-01-02 | Disposition: A | Discharge status: Home / Self Care | Payer: Medicare Other | Attending: Emergency Medicine | Admitting: Emergency Medicine

## 2020-01-02 ENCOUNTER — Encounter (HOSPITAL_COMMUNITY): Payer: Self-pay | Admitting: Emergency Medicine

## 2020-01-02 ENCOUNTER — Emergency Department (HOSPITAL_COMMUNITY): Payer: Medicare Other

## 2020-01-02 ENCOUNTER — Other Ambulatory Visit: Payer: Self-pay

## 2020-01-02 DIAGNOSIS — K5900 Constipation, unspecified: Secondary | ICD-10-CM | POA: Insufficient documentation

## 2020-01-02 NOTE — ED Triage Notes (Signed)
Per patient, states she has'nt had a BM for 2 weeks-states she has tried everything with no success

## 2020-01-06 ENCOUNTER — Other Ambulatory Visit: Payer: Self-pay

## 2020-01-06 ENCOUNTER — Inpatient Hospital Stay (HOSPITAL_COMMUNITY): Payer: Medicare Other

## 2020-01-06 ENCOUNTER — Encounter (HOSPITAL_COMMUNITY): Payer: Self-pay | Admitting: *Deleted

## 2020-01-06 ENCOUNTER — Emergency Department (HOSPITAL_COMMUNITY): Payer: Medicare Other

## 2020-01-06 ENCOUNTER — Inpatient Hospital Stay (HOSPITAL_COMMUNITY)
Admission: EM | Admit: 2020-01-06 | Discharge: 2020-02-04 | DRG: 064 | Disposition: E | Payer: Medicare Other | Attending: Pulmonary Disease | Admitting: Pulmonary Disease

## 2020-01-06 DIAGNOSIS — G936 Cerebral edema: Secondary | ICD-10-CM | POA: Diagnosis present

## 2020-01-06 DIAGNOSIS — N179 Acute kidney failure, unspecified: Secondary | ICD-10-CM | POA: Diagnosis present

## 2020-01-06 DIAGNOSIS — F17210 Nicotine dependence, cigarettes, uncomplicated: Secondary | ICD-10-CM | POA: Diagnosis present

## 2020-01-06 DIAGNOSIS — Z79890 Hormone replacement therapy (postmenopausal): Secondary | ICD-10-CM

## 2020-01-06 DIAGNOSIS — R29731 NIHSS score 31: Secondary | ICD-10-CM | POA: Diagnosis not present

## 2020-01-06 DIAGNOSIS — Z881 Allergy status to other antibiotic agents status: Secondary | ICD-10-CM

## 2020-01-06 DIAGNOSIS — Z91041 Radiographic dye allergy status: Secondary | ICD-10-CM

## 2020-01-06 DIAGNOSIS — F419 Anxiety disorder, unspecified: Secondary | ICD-10-CM | POA: Diagnosis present

## 2020-01-06 DIAGNOSIS — I63419 Cerebral infarction due to embolism of unspecified middle cerebral artery: Secondary | ICD-10-CM | POA: Diagnosis not present

## 2020-01-06 DIAGNOSIS — E785 Hyperlipidemia, unspecified: Secondary | ICD-10-CM | POA: Diagnosis present

## 2020-01-06 DIAGNOSIS — I959 Hypotension, unspecified: Secondary | ICD-10-CM | POA: Diagnosis not present

## 2020-01-06 DIAGNOSIS — D638 Anemia in other chronic diseases classified elsewhere: Secondary | ICD-10-CM | POA: Diagnosis present

## 2020-01-06 DIAGNOSIS — R4020 Unspecified coma: Secondary | ICD-10-CM | POA: Diagnosis present

## 2020-01-06 DIAGNOSIS — Z66 Do not resuscitate: Secondary | ICD-10-CM | POA: Diagnosis present

## 2020-01-06 DIAGNOSIS — F329 Major depressive disorder, single episode, unspecified: Secondary | ICD-10-CM | POA: Diagnosis present

## 2020-01-06 DIAGNOSIS — I639 Cerebral infarction, unspecified: Secondary | ICD-10-CM

## 2020-01-06 DIAGNOSIS — G939 Disorder of brain, unspecified: Secondary | ICD-10-CM | POA: Diagnosis present

## 2020-01-06 DIAGNOSIS — R29730 NIHSS score 30: Secondary | ICD-10-CM | POA: Diagnosis present

## 2020-01-06 DIAGNOSIS — G9349 Other encephalopathy: Secondary | ICD-10-CM | POA: Diagnosis present

## 2020-01-06 DIAGNOSIS — Z825 Family history of asthma and other chronic lower respiratory diseases: Secondary | ICD-10-CM

## 2020-01-06 DIAGNOSIS — E872 Acidosis: Secondary | ICD-10-CM | POA: Diagnosis present

## 2020-01-06 DIAGNOSIS — Z79891 Long term (current) use of opiate analgesic: Secondary | ICD-10-CM

## 2020-01-06 DIAGNOSIS — R578 Other shock: Secondary | ICD-10-CM | POA: Diagnosis present

## 2020-01-06 DIAGNOSIS — Z923 Personal history of irradiation: Secondary | ICD-10-CM

## 2020-01-06 DIAGNOSIS — I10 Essential (primary) hypertension: Secondary | ICD-10-CM | POA: Diagnosis present

## 2020-01-06 DIAGNOSIS — I248 Other forms of acute ischemic heart disease: Secondary | ICD-10-CM | POA: Diagnosis present

## 2020-01-06 DIAGNOSIS — Z20822 Contact with and (suspected) exposure to covid-19: Secondary | ICD-10-CM | POA: Diagnosis present

## 2020-01-06 DIAGNOSIS — Z90710 Acquired absence of both cervix and uterus: Secondary | ICD-10-CM

## 2020-01-06 DIAGNOSIS — J45909 Unspecified asthma, uncomplicated: Secondary | ICD-10-CM | POA: Diagnosis present

## 2020-01-06 DIAGNOSIS — H9190 Unspecified hearing loss, unspecified ear: Secondary | ICD-10-CM | POA: Diagnosis present

## 2020-01-06 DIAGNOSIS — E039 Hypothyroidism, unspecified: Secondary | ICD-10-CM | POA: Diagnosis present

## 2020-01-06 DIAGNOSIS — M419 Scoliosis, unspecified: Secondary | ICD-10-CM | POA: Diagnosis present

## 2020-01-06 DIAGNOSIS — G8929 Other chronic pain: Secondary | ICD-10-CM | POA: Diagnosis present

## 2020-01-06 DIAGNOSIS — I6389 Other cerebral infarction: Secondary | ICD-10-CM | POA: Diagnosis not present

## 2020-01-06 DIAGNOSIS — Z4659 Encounter for fitting and adjustment of other gastrointestinal appliance and device: Secondary | ICD-10-CM

## 2020-01-06 DIAGNOSIS — R579 Shock, unspecified: Secondary | ICD-10-CM | POA: Diagnosis not present

## 2020-01-06 DIAGNOSIS — R34 Anuria and oliguria: Secondary | ICD-10-CM | POA: Diagnosis present

## 2020-01-06 DIAGNOSIS — I63533 Cerebral infarction due to unspecified occlusion or stenosis of bilateral posterior cerebral arteries: Secondary | ICD-10-CM | POA: Diagnosis present

## 2020-01-06 DIAGNOSIS — Z515 Encounter for palliative care: Secondary | ICD-10-CM | POA: Diagnosis not present

## 2020-01-06 DIAGNOSIS — R4182 Altered mental status, unspecified: Secondary | ICD-10-CM | POA: Diagnosis present

## 2020-01-06 DIAGNOSIS — Z79899 Other long term (current) drug therapy: Secondary | ICD-10-CM

## 2020-01-06 DIAGNOSIS — R29732 NIHSS score 32: Secondary | ICD-10-CM | POA: Diagnosis not present

## 2020-01-06 DIAGNOSIS — Z880 Allergy status to penicillin: Secondary | ICD-10-CM

## 2020-01-06 DIAGNOSIS — I63413 Cerebral infarction due to embolism of bilateral middle cerebral arteries: Secondary | ICD-10-CM | POA: Diagnosis not present

## 2020-01-06 DIAGNOSIS — M1990 Unspecified osteoarthritis, unspecified site: Secondary | ICD-10-CM | POA: Diagnosis present

## 2020-01-06 DIAGNOSIS — Z808 Family history of malignant neoplasm of other organs or systems: Secondary | ICD-10-CM

## 2020-01-06 DIAGNOSIS — R29729 NIHSS score 29: Secondary | ICD-10-CM | POA: Diagnosis not present

## 2020-01-06 DIAGNOSIS — Z8249 Family history of ischemic heart disease and other diseases of the circulatory system: Secondary | ICD-10-CM

## 2020-01-06 DIAGNOSIS — R1310 Dysphagia, unspecified: Secondary | ICD-10-CM | POA: Diagnosis present

## 2020-01-06 DIAGNOSIS — Z85818 Personal history of malignant neoplasm of other sites of lip, oral cavity, and pharynx: Secondary | ICD-10-CM

## 2020-01-06 DIAGNOSIS — K219 Gastro-esophageal reflux disease without esophagitis: Secondary | ICD-10-CM | POA: Diagnosis present

## 2020-01-06 DIAGNOSIS — J9601 Acute respiratory failure with hypoxia: Secondary | ICD-10-CM | POA: Diagnosis present

## 2020-01-06 LAB — POCT I-STAT 7, (LYTES, BLD GAS, ICA,H+H)
Acid-base deficit: 7 mmol/L — ABNORMAL HIGH (ref 0.0–2.0)
Bicarbonate: 21.6 mmol/L (ref 20.0–28.0)
Calcium, Ion: 1.08 mmol/L — ABNORMAL LOW (ref 1.15–1.40)
HCT: 36 % (ref 36.0–46.0)
Hemoglobin: 12.2 g/dL (ref 12.0–15.0)
O2 Saturation: 100 %
Potassium: 4.1 mmol/L (ref 3.5–5.1)
Sodium: 142 mmol/L (ref 135–145)
TCO2: 23 mmol/L (ref 22–32)
pCO2 arterial: 56 mmHg — ABNORMAL HIGH (ref 32.0–48.0)
pH, Arterial: 7.195 — CL (ref 7.350–7.450)
pO2, Arterial: 366 mmHg — ABNORMAL HIGH (ref 83.0–108.0)

## 2020-01-06 LAB — MAGNESIUM: Magnesium: 1.7 mg/dL (ref 1.7–2.4)

## 2020-01-06 LAB — CBC WITH DIFFERENTIAL/PLATELET
Abs Immature Granulocytes: 0 10*3/uL (ref 0.00–0.07)
Abs Immature Granulocytes: 0.16 10*3/uL — ABNORMAL HIGH (ref 0.00–0.07)
Basophils Absolute: 0 10*3/uL (ref 0.0–0.1)
Basophils Absolute: 0 10*3/uL (ref 0.0–0.1)
Basophils Relative: 0 %
Basophils Relative: 0 %
Eosinophils Absolute: 0 10*3/uL (ref 0.0–0.5)
Eosinophils Absolute: 0 10*3/uL (ref 0.0–0.5)
Eosinophils Relative: 0 %
Eosinophils Relative: 0 %
HCT: 28.1 % — ABNORMAL LOW (ref 36.0–46.0)
HCT: 37.5 % (ref 36.0–46.0)
Hemoglobin: 7.9 g/dL — ABNORMAL LOW (ref 12.0–15.0)
Hemoglobin: 12.2 g/dL (ref 12.0–15.0)
Immature Granulocytes: 1 %
Lymphocytes Relative: 3 %
Lymphocytes Relative: 4 %
Lymphs Abs: 0.3 10*3/uL — ABNORMAL LOW (ref 0.7–4.0)
Lymphs Abs: 0.6 10*3/uL — ABNORMAL LOW (ref 0.7–4.0)
MCH: 32.1 pg (ref 26.0–34.0)
MCH: 31.7 pg (ref 26.0–34.0)
MCHC: 28.1 g/dL — ABNORMAL LOW (ref 30.0–36.0)
MCHC: 32.5 g/dL (ref 30.0–36.0)
MCV: 114.2 fL — ABNORMAL HIGH (ref 80.0–100.0)
MCV: 97.4 fL (ref 80.0–100.0)
Monocytes Absolute: 0.5 10*3/uL (ref 0.1–1.0)
Monocytes Absolute: 1.2 10*3/uL — ABNORMAL HIGH (ref 0.1–1.0)
Monocytes Relative: 5 %
Monocytes Relative: 8 %
Neutro Abs: 8.8 10*3/uL — ABNORMAL HIGH (ref 1.7–7.7)
Neutro Abs: 12.4 10*3/uL — ABNORMAL HIGH (ref 1.7–7.7)
Neutrophils Relative %: 92 %
Neutrophils Relative %: 87 %
Platelets: 226 10*3/uL (ref 150–400)
Platelets: 366 10*3/uL (ref 150–400)
RBC: 2.46 MIL/uL — ABNORMAL LOW (ref 3.87–5.11)
RBC: 3.85 MIL/uL — ABNORMAL LOW (ref 3.87–5.11)
RDW: 15.8 % — ABNORMAL HIGH (ref 11.5–15.5)
RDW: 15.4 % (ref 11.5–15.5)
WBC: 9.6 10*3/uL (ref 4.0–10.5)
WBC: 14.4 10*3/uL — ABNORMAL HIGH (ref 4.0–10.5)
nRBC: 0.4 % — ABNORMAL HIGH (ref 0.0–0.2)
nRBC: 1 /100 WBC — ABNORMAL HIGH
nRBC: 0.1 % (ref 0.0–0.2)

## 2020-01-06 LAB — URINALYSIS, ROUTINE W REFLEX MICROSCOPIC
Bacteria, UA: NONE SEEN
Bilirubin Urine: NEGATIVE
Glucose, UA: NEGATIVE mg/dL
Ketones, ur: NEGATIVE mg/dL
Leukocytes,Ua: NEGATIVE
Nitrite: NEGATIVE
Protein, ur: 30 mg/dL — AB
Specific Gravity, Urine: 1.017 (ref 1.005–1.030)
pH: 5 (ref 5.0–8.0)

## 2020-01-06 LAB — I-STAT CHEM 8, ED
BUN: 10 mg/dL (ref 6–20)
Calcium, Ion: 0.88 mmol/L — CL (ref 1.15–1.40)
Chloride: 116 mmol/L — ABNORMAL HIGH (ref 98–111)
Creatinine, Ser: 0.8 mg/dL (ref 0.44–1.00)
Glucose, Bld: 63 mg/dL — ABNORMAL LOW (ref 70–99)
HCT: 24 % — ABNORMAL LOW (ref 36.0–46.0)
Hemoglobin: 8.2 g/dL — ABNORMAL LOW (ref 12.0–15.0)
Potassium: 3.1 mmol/L — ABNORMAL LOW (ref 3.5–5.1)
Sodium: 147 mmol/L — ABNORMAL HIGH (ref 135–145)
TCO2: 14 mmol/L — ABNORMAL LOW (ref 22–32)

## 2020-01-06 LAB — COMPREHENSIVE METABOLIC PANEL
ALT: 304 U/L — ABNORMAL HIGH (ref 0–44)
AST: 494 U/L — ABNORMAL HIGH (ref 15–41)
Albumin: 3.1 g/dL — ABNORMAL LOW (ref 3.5–5.0)
Alkaline Phosphatase: 112 U/L (ref 38–126)
Anion gap: 16 — ABNORMAL HIGH (ref 5–15)
BUN: 14 mg/dL (ref 6–20)
CO2: 17 mmol/L — ABNORMAL LOW (ref 22–32)
Calcium: 8.1 mg/dL — ABNORMAL LOW (ref 8.9–10.3)
Chloride: 109 mmol/L (ref 98–111)
Creatinine, Ser: 1.26 mg/dL — ABNORMAL HIGH (ref 0.44–1.00)
GFR calc Af Amer: 54 mL/min — ABNORMAL LOW (ref >60)
GFR calc non Af Amer: 47 mL/min — ABNORMAL LOW (ref >60)
Glucose, Bld: 119 mg/dL — ABNORMAL HIGH (ref 70–99)
Potassium: 4.1 mmol/L (ref 3.5–5.1)
Sodium: 142 mmol/L (ref 135–145)
Total Bilirubin: 0.4 mg/dL (ref 0.3–1.2)
Total Protein: 6 g/dL — ABNORMAL LOW (ref 6.5–8.1)

## 2020-01-06 LAB — RETICULOCYTES
Immature Retic Fract: 24.7 % — ABNORMAL HIGH (ref 2.3–15.9)
RBC.: 3.71 MIL/uL — ABNORMAL LOW (ref 3.87–5.11)
Retic Count, Absolute: 45.3 10*3/uL (ref 19.0–186.0)
Retic Ct Pct: 1.2 % (ref 0.4–3.1)

## 2020-01-06 LAB — CBG MONITORING, ED: Glucose-Capillary: 86 mg/dL (ref 70–99)

## 2020-01-06 LAB — PHOSPHORUS: Phosphorus: 2.4 mg/dL — ABNORMAL LOW (ref 2.5–4.6)

## 2020-01-06 LAB — LIPID PANEL
Cholesterol: 146 mg/dL (ref 0–200)
HDL: 28 mg/dL — ABNORMAL LOW (ref >40)
LDL Cholesterol: 96 mg/dL (ref 0–99)
Total CHOL/HDL Ratio: 5.2 RATIO
Triglycerides: 109 mg/dL (ref <150)
VLDL: 22 mg/dL (ref 0–40)

## 2020-01-06 LAB — ACETAMINOPHEN LEVEL: Acetaminophen (Tylenol), Serum: <10 ug/mL — ABNORMAL LOW (ref 10–30)

## 2020-01-06 LAB — RESPIRATORY PANEL BY RT PCR (FLU A&B, COVID)
Influenza A by PCR: NEGATIVE
Influenza B by PCR: NEGATIVE
SARS Coronavirus 2 by RT PCR: NEGATIVE

## 2020-01-06 LAB — CK: Total CK: 917 U/L — ABNORMAL HIGH (ref 38–234)

## 2020-01-06 LAB — ETHANOL: Alcohol, Ethyl (B): <10 mg/dL (ref <10)

## 2020-01-06 LAB — RAPID URINE DRUG SCREEN, HOSP PERFORMED
Amphetamines: NOT DETECTED
Barbiturates: NOT DETECTED
Benzodiazepines: POSITIVE — AB
Cocaine: NOT DETECTED
Opiates: NOT DETECTED
Tetrahydrocannabinol: NOT DETECTED

## 2020-01-06 LAB — TROPONIN I (HIGH SENSITIVITY): Troponin I (High Sensitivity): 118 ng/L (ref <18)

## 2020-01-06 LAB — SALICYLATE LEVEL: Salicylate Lvl: <7 mg/dL — ABNORMAL LOW (ref 7.0–30.0)

## 2020-01-06 LAB — HIV ANTIBODY (ROUTINE TESTING W REFLEX): HIV Screen 4th Generation wRfx: NONREACTIVE

## 2020-01-06 LAB — LACTIC ACID, PLASMA
Lactic Acid, Venous: 5.5 mmol/L (ref 0.5–1.9)
Lactic Acid, Venous: 3.1 mmol/L (ref 0.5–1.9)

## 2020-01-06 MED ORDER — LEVOTHYROXINE SODIUM 100 MCG PO TABS
100.0000 ug | ORAL_TABLET | Freq: Every day | ORAL | Status: DC
  Filled 2020-01-06: qty 1

## 2020-01-06 MED ORDER — THIAMINE HCL 100 MG/ML IJ SOLN
100.0000 mg | Freq: Every day | INTRAMUSCULAR | Status: DC
  Administered 2020-01-07 – 2020-01-08 (×2): 100 mg via INTRAVENOUS
  Filled 2020-01-06 (×3): qty 2

## 2020-01-06 MED ORDER — LAMOTRIGINE 100 MG PO TABS
100.0000 mg | ORAL_TABLET | Freq: Every day | ORAL | Status: DC
  Filled 2020-01-06: qty 1

## 2020-01-06 MED ORDER — FENTANYL CITRATE (PF) 100 MCG/2ML IJ SOLN: 50.0000 ug | Freq: Once | INTRAMUSCULAR | Status: DC

## 2020-01-06 MED ORDER — FOLIC ACID 5 MG/ML IJ SOLN
1.0000 mg | Freq: Every day | INTRAMUSCULAR | Status: DC
  Administered 2020-01-07 – 2020-01-08 (×2): 1 mg via INTRAVENOUS
  Filled 2020-01-06 (×4): qty 0.2

## 2020-01-06 MED ORDER — NOREPINEPHRINE 4 MG/250ML-% IV SOLN
0.0000 ug/min | INTRAVENOUS | Status: DC
  Administered 2020-01-06: 6 ug/min via INTRAVENOUS

## 2020-01-06 MED ORDER — PHENYLEPHRINE 40 MCG/ML (10ML) SYRINGE FOR IV PUSH (FOR BLOOD PRESSURE SUPPORT)
PREFILLED_SYRINGE | INTRAVENOUS | Status: AC
  Filled 2020-01-06: qty 10

## 2020-01-06 MED ORDER — ATORVASTATIN CALCIUM 80 MG PO TABS: 80.0000 mg | ORAL_TABLET | Freq: Every day | ORAL | Status: DC

## 2020-01-06 MED ORDER — NOREPINEPHRINE 4 MG/250ML-% IV SOLN
INTRAVENOUS | Status: AC
  Filled 2020-01-06: qty 250

## 2020-01-06 MED ORDER — FAMOTIDINE IN NACL 20-0.9 MG/50ML-% IV SOLN
20.0000 mg | INTRAVENOUS | Status: DC
  Administered 2020-01-06 – 2020-01-08 (×3): 20 mg via INTRAVENOUS
  Filled 2020-01-06 (×3): qty 50

## 2020-01-06 MED ORDER — ACETAMINOPHEN 160 MG/5ML PO SOLN: 650.0000 mg | ORAL | Status: DC | PRN

## 2020-01-06 MED ORDER — ENOXAPARIN SODIUM 40 MG/0.4ML ~~LOC~~ SOLN: 40.0000 mg | SUBCUTANEOUS | Status: DC

## 2020-01-06 MED ORDER — FENTANYL 2500MCG IN NS 250ML (10MCG/ML) PREMIX INFUSION
50.0000 ug/h | INTRAVENOUS | Status: DC
  Administered 2020-01-06: 50 ug/h via INTRAVENOUS
  Filled 2020-01-06: qty 250

## 2020-01-06 MED ORDER — ASPIRIN 325 MG PO TABS: 325.0000 mg | ORAL_TABLET | Freq: Every day | ORAL | Status: DC

## 2020-01-06 MED ORDER — ACETAMINOPHEN 650 MG RE SUPP
650.0000 mg | RECTAL | Status: DC | PRN
  Administered 2020-01-06 – 2020-01-09 (×3): 650 mg via RECTAL
  Filled 2020-01-06 (×3): qty 1

## 2020-01-06 MED ORDER — FENTANYL BOLUS VIA INFUSION
50.0000 ug | INTRAVENOUS | Status: DC | PRN
  Filled 2020-01-06: qty 50

## 2020-01-06 MED ORDER — LABETALOL HCL 5 MG/ML IV SOLN
10.0000 mg | INTRAVENOUS | Status: DC | PRN
  Administered 2020-01-08: 10 mg via INTRAVENOUS
  Filled 2020-01-06: qty 4

## 2020-01-06 NOTE — ED Notes (Signed)
Critical care has spoken with family  A  Son who has not been here at all

## 2020-01-06 NOTE — ED Notes (Addendum)
DR Lewis

## 2020-01-06 NOTE — ED Provider Notes (Signed)
[facility] EMERGENCY DEPARTMENT Provider Note   CSN: [id number] Arrival date & time: 01/07/2020  1323     History Chief Complaint  Patient presents with  . Altered Mental Status    Stacey Carroll is a 59 y.o. female.  Patient is a 59 year old female who presents unresponsive.  She has a history of throat cancer.  She is also on opioids.  She was found unresponsive by her son.  The only recent illness that we are aware of is that she has had constipation issues and has not had a bowel movement in about 2 weeks.  History is limited due to patient's unresponsive state.  EMS attempted intubation that this was unsuccessful.        Past Medical History:  Diagnosis Date  . Anxiety   . Arthritis   . Asthma   . Cancer ([place]) 2015   stage 4 throat cancer  . Cough   . Depression   . Dyslipidemia   . Dysphagia 04/27/2014  . Endometriosis   . Hard of hearing   . Headache(784.0)   . Hypercholesterolemia   . Hypertension    not taking meds  . Memory loss   . Nausea alone 05/31/2014  . PONV (postoperative nausea and vomiting)   . Radiation 05/24/14- 07/18/14   Vallecula, Epiglottis, and bilateral neck.   . S/P radiation therapy 05/24/2014-07/18/2014   7000  cGy/ Vallecula/neck  . Scoliosis   . Throat pain 04/19/2014  . Visual disturbance 03/11/2013    Patient Active Problem List   Diagnosis Date Noted  . Gait abnormality 09/20/2018  . Memory loss 09/20/2018  . Leukocytosis 07/16/2016  . Hypothyroidism 07/16/2016  . GERD (gastroesophageal reflux disease) 07/16/2016  . Depression 07/16/2016  . Supraglottic edema 07/13/2016  . Bilateral hearing loss 01/17/2015  . Preventive measure 06/14/2014  . Anemia in neoplastic disease 06/14/2014  . Nausea without vomiting 05/31/2014  . S/P percutaneous endoscopic gastrostomy (PEG) tube placement ([place]) 05/24/2014  . Tracheostomy status ([place]) 05/24/2014  . Protein-calorie malnutrition, severe ([place]) 05/19/2014  . Dysphagia  04/27/2014  . 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 ABDOMINAL HYSTERECTOMY  1987  . CERVICAL LAMINECTOMY  2007 (?), 2011  . GASTROSTOMY N/A 05/18/2014   Procedure: GASTROSTOMY/PLACEMENT OF G-TUBE;  Surgeon: Rolm Bookbinder, MD;  Location: [facility];  Service: General;  Laterality: N/A;  . LARYNGOSCOPY AND ESOPHAGOSCOPY N/A 04/14/2014   Procedure: DIRECT LARYNGOSCOPY WITH BIOPSY  AND ESOPHAGOSCOPY;  Surgeon: Ruby Cola, MD;  Location: [facility];  Service: ENT;  Laterality: N/A;  . PORTACATH PLACEMENT N/A 05/18/2014   Procedure: INSERTION PORT-A-CATH;  Surgeon: Rolm Bookbinder, MD;  Location: [facility];  Service: General;  Laterality: N/A;  . SHOULDER SURGERY Left   . TRACHEOSTOMY TUBE PLACEMENT N/A 05/18/2014   Procedure: TRACHEOSTOMY;  Surgeon: Melissa Montane, MD;  Location: [facility];  Service: ENT;  Laterality: N/A;     OB History   No obstetric history on file.     Family History  Problem Relation Age of Onset  . Emphysema Mother   . Cancer Mother        throat ca  . Cancer Father        prostate ca  . Heart disease Father   . Breast cancer Neg Hx     Social History   Tobacco Use  .  Smoking status: Current Every Day Smoker    Packs/day: 0.50    Years: 39.00    Pack years: 19.50    Types: Cigarettes  . Smokeless tobacco: Never Used  Substance Use Topics  . Alcohol use: Yes    Comment: occasional  . Drug use: No    Home Medications Prior to Admission medications   Medication Sig Start Date End Date Taking? Authorizing Provider  albuterol (ACCUNEB) 0.63 MG/3ML nebulizer solution Take 1 ampule by nebulization every 6 (six) hours as needed for wheezing.    [provider]  amLODipine (NORVASC) 5 MG tablet Take 5 mg by mouth daily.    [provider]  buPROPion (WELLBUTRIN XL) 300 MG 24 hr  tablet Take 300 mg by mouth daily.    [provider]  diazepam (VALIUM) 10 MG tablet Take 10 mg by mouth 3 (three) times daily as needed for anxiety.     [provider]  fluticasone (FLONASE) 50 MCG/ACT nasal spray Place 1 spray into both nostrils daily as needed.     [provider]  folic acid (FOLVITE) 1 MG tablet Take 1 mg by mouth daily.    [provider]  HYDROcodone-acetaminophen (NORCO/VICODIN) 5-325 MG tablet Take 2 tablets by mouth every 6 (six) hours as needed for severe pain. Patient not taking: Reported on 10/25/2019 06/11/19   Caccavale, Sophia, PA-C  lamoTRIgine (LAMICTAL) 100 MG tablet Take 100 mg by mouth daily.    [provider]  LEVOTHYROXINE SODIUM PO Take 100 mcg by mouth daily.     [provider]  methylphenidate (RITALIN) 10 MG tablet Take 10 mg by mouth daily.    [provider]  ondansetron (ZOFRAN) 4 MG tablet Take 1 tablet (4 mg total) by mouth every 8 (eight) hours as needed for nausea or vomiting. 06/11/19   Caccavale, Sophia, PA-C  OVER THE COUNTER MEDICATION Goodies BC powder    [provider]  OVER THE COUNTER MEDICATION Fiber capsule once a day; sprinkle on food    [provider]  pantoprazole (PROTONIX) 40 MG tablet Take 40 mg by mouth daily.    [provider]  PARoxetine (PAXIL) 40 MG tablet Take 40 mg by mouth 2 (two) times daily.    [provider]  pravastatin (PRAVACHOL) 20 MG tablet Take 20 mg by mouth daily.    [provider]  traMADol (ULTRAM) 50 MG tablet Take 50 mg by mouth daily.    [provider]  zolpidem (AMBIEN) 10 MG tablet Take 10 mg by mouth at bedtime as needed for sleep.    [provider]    Allergies    Penicillins, Vancomycin, and Barium sulfate  Review of Systems   Review of Systems  Unable to perform ROS: Patient unresponsive    Physical Exam Updated Vital Signs BP 103/75   Pulse 91   Temp 98.5 F  (36.9 C)   Resp (!) 25   Ht 5' (1.524 m)   Wt 63.5 kg   SpO2 100%   BMI 27.34 kg/m   Physical Exam Constitutional:      Appearance: She is well-developed.     Comments: Unresponsive  HENT:     Head: Normocephalic and atraumatic.  Eyes:     Pupils: Pupils are equal, round, and reactive to light.  Cardiovascular:     Rate and Rhythm: Normal rate and regular rhythm.     Heart sounds: Normal heart sounds.  Pulmonary:  Effort: Pulmonary effort is normal. No respiratory distress.     Breath sounds: Normal breath sounds. No wheezing or rales.  Chest:     Chest wall: No tenderness.  Abdominal:     General: Bowel sounds are normal.     Palpations: Abdomen is soft.     Tenderness: There is no abdominal tenderness. There is no guarding or rebound.  Musculoskeletal:        General: Normal range of motion.     Cervical back: Normal range of motion and neck supple.  Lymphadenopathy:     Cervical: No cervical adenopathy.  Skin:    General: Skin is warm and dry.     Findings: No rash.  Neurological:     Comments: Patient is unresponsive     ED Results / Procedures / Treatments   Labs (all labs ordered are listed, but only abnormal results are displayed) Labs Reviewed  CBC WITH DIFFERENTIAL/PLATELET - Abnormal; Notable for the following components:      Result Value   RBC 2.46 (*)    Hemoglobin 7.9 (*)    HCT 28.1 (*)    MCV 114.2 (*)    MCHC 28.1 (*)    RDW 15.8 (*)    nRBC 0.4 (*)    Neutro Abs 8.8 (*)    Lymphs Abs 0.3 (*)    nRBC 1 (*)    All other components within normal limits  URINALYSIS, ROUTINE W REFLEX MICROSCOPIC - Abnormal; Notable for the following components:   APPearance HAZY (*)    Hgb urine dipstick MODERATE (*)    Protein, ur 30 (*)    All other components within normal limits  LACTIC ACID, PLASMA - Abnormal; Notable for the following components:   Lactic Acid, Venous 5.5 (*)    All other components within normal limits  RAPID URINE DRUG SCREEN,  HOSP PERFORMED - Abnormal; Notable for the following components:   Benzodiazepines POSITIVE (*)    All other components within normal limits  I-STAT CHEM 8, ED - Abnormal; Notable for the following components:   Sodium 147 (*)    Potassium 3.1 (*)    Chloride 116 (*)    Glucose, Bld 63 (*)    Calcium, Ion 0.88 (*)    TCO2 14 (*)    Hemoglobin 8.2 (*)    HCT 24.0 (*)    All other components within normal limits  POCT I-STAT 7, (LYTES, BLD GAS, ICA,H+H) - Abnormal; Notable for the following components:   pH, Arterial 7.195 (*)    pCO2 arterial 56.0 (*)    pO2, Arterial 366.0 (*)    Acid-base deficit 7.0 (*)    Calcium, Ion 1.08 (*)    All other components within normal limits  RESPIRATORY PANEL BY RT PCR (FLU A&B, COVID)  CULTURE, BLOOD (ROUTINE X 2)  CULTURE, BLOOD (ROUTINE X 2)  LACTIC ACID, PLASMA  ACETAMINOPHEN LEVEL  ETHANOL  SALICYLATE LEVEL  CBG MONITORING, ED  I-STAT ARTERIAL BLOOD GAS, ED  CBG MONITORING, ED  TROPONIN I (HIGH SENSITIVITY)    EKG EKG Interpretation  Date/Time:  Friday January 06 2020 13:27:16 EDT Ventricular Rate:  88 PR Interval:    QRS Duration: 104 QT Interval:  407 QTC Calculation: 493 R Axis:   43 Text Interpretation: Sinus rhythm Borderline prolonged QT interval since last tracing no significant change Confirmed by Malvin Johns [phone]) on 02/03/2020 2:36:05 PM   Radiology CT ABDOMEN PELVIS WO CONTRAST  Result Date: 01/08/2020 CLINICAL DATA:  Unresponsive, abdominal distension EXAM: CT CHEST, ABDOMEN AND PELVIS WITHOUT CONTRAST TECHNIQUE: Multidetector CT imaging of the chest, abdomen and pelvis was performed following the standard protocol without IV contrast. COMPARISON:  06/11/2019 FINDINGS: CT CHEST FINDINGS Cardiovascular: No significant vascular findings. Normal heart size. No pericardial effusion. Mediastinum/Nodes: No enlarged mediastinal, hilar, or axillary lymph nodes. There is air within the lower neck and upper mediastinum adjacent  to the left aspect of the trachea (series 4, image 25). Thyroid gland and esophagus demonstrate no significant findings. Lungs/Pleura: Endotracheal intubation. Mild centrilobular emphysema. Trace bilateral pleural effusions and associated atelectasis or consolidation. Musculoskeletal: No chest wall mass or suspicious bone lesions identified. CT ABDOMEN PELVIS FINDINGS Hepatobiliary: No solid liver abnormality is seen. No gallstones, gallbladder wall thickening, or biliary dilatation. Pancreas: Unremarkable. No pancreatic ductal dilatation or surrounding inflammatory changes. Spleen: Normal in size without significant abnormality. Adrenals/Urinary Tract: Adrenal glands are unremarkable. Kidneys are normal, without renal calculi, solid lesion, or hydronephrosis. Bladder is unremarkable. Stomach/Bowel: Stomach is within normal limits. Appendix appears normal. Descending and sigmoid diverticulosis. Thickening of the sigmoid colon, similar in appearance to prior examination. Vascular/Lymphatic: Aortic atherosclerosis. Unchanged ectasia of the infrarenal abdominal aorta measuring up to 2.9 x 2.9 cm. No enlarged abdominal or pelvic lymph nodes. Reproductive: No mass or other abnormality. Other: No abdominal wall hernia or abnormality. No abdominopelvic ascites. Musculoskeletal: No acute or significant osseous findings. IMPRESSION: 1. No acute noncontrast CT findings of the abdomen or pelvis to explain abdominal distension. 2. Endotracheal intubation. There is air within the lower neck and upper mediastinum adjacent to the left aspect of the trachea, consistent with traumatic intubation or barotrauma. 3. Trace bilateral pleural effusions and associated atelectasis or consolidation. 4.  Emphysema (ICD10-J43.9). 5. Colonic diverticulosis. Thickening of the sigmoid colon, similar in appearance to prior examination, and likely chronic sequelae of prior diverticulitis. 6. Aortic Atherosclerosis (ICD10-I70.0). Unchanged ectasia  of the infrarenal abdominal aorta measuring up to 2.9 x 2.9 cm. Electronically Signed   By: Eddie Candle M.D.   On: 01/31/2020 15:16   CT Head Wo Contrast  Result Date: 01/24/2020 CLINICAL DATA:  59 year female with altered mental status EXAM: CT HEAD WITHOUT CONTRAST TECHNIQUE: Contiguous axial images were obtained from the base of the skull through the vertex without intravenous contrast. COMPARISON:  MRI 11/29/2017 FINDINGS: Brain: Confluent hypodensity in the right occipital region and parietal lobe extending through the cortex with loss of the Raffo-white differentiation. Questionable focal hypodensity in right frontal cortex. Loss of the Saxby-white differentiation in focal region left occipital lobe. No midline shift.  No intracranial hemorrhage. Vascular: Intracranial atherosclerosis. Skull: No acute bony abnormality. Sinuses/Orbits: Partial opacification of ethmoid air cells. Mucoperiosteal thickening of the sphenoid air cells. Other: None IMPRESSION: Evolving multifocal infarction, with the largest region in the right PCA territory. Additional regions of infarction include the left parietal region and questionably within the right frontal region. Referral for neurology evaluation is indicated, as well as evaluation with MRI. These results were discussed by telephone at the time of interpretation on 02/03/2020 at 3:16 pm with Dr. Ralene Bathe. Electronically Signed   By: Corrie Mckusick D.O.   On: 01/18/2020 15:23   CT CHEST WO CONTRAST  Result Date: 01/24/2020 CLINICAL DATA:  Unresponsive, abdominal distension EXAM: CT CHEST, ABDOMEN AND PELVIS WITHOUT CONTRAST TECHNIQUE: Multidetector CT imaging of the chest, abdomen and pelvis was performed following the standard protocol without IV contrast. COMPARISON:  06/11/2019 FINDINGS: CT CHEST FINDINGS Cardiovascular: No significant vascular findings. Normal heart size.  No pericardial effusion. Mediastinum/Nodes: No enlarged mediastinal, hilar, or axillary lymph  nodes. There is air within the lower neck and upper mediastinum adjacent to the left aspect of the trachea (series 4, image 25). Thyroid gland and esophagus demonstrate no significant findings. Lungs/Pleura: Endotracheal intubation. Mild centrilobular emphysema. Trace bilateral pleural effusions and associated atelectasis or consolidation. Musculoskeletal: No chest wall mass or suspicious bone lesions identified. CT ABDOMEN PELVIS FINDINGS Hepatobiliary: No solid liver abnormality is seen. No gallstones, gallbladder wall thickening, or biliary dilatation. Pancreas: Unremarkable. No pancreatic ductal dilatation or surrounding inflammatory changes. Spleen: Normal in size without significant abnormality. Adrenals/Urinary Tract: Adrenal glands are unremarkable. Kidneys are normal, without renal calculi, solid lesion, or hydronephrosis. Bladder is unremarkable. Stomach/Bowel: Stomach is within normal limits. Appendix appears normal. Descending and sigmoid diverticulosis. Thickening of the sigmoid colon, similar in appearance to prior examination. Vascular/Lymphatic: Aortic atherosclerosis. Unchanged ectasia of the infrarenal abdominal aorta measuring up to 2.9 x 2.9 cm. No enlarged abdominal or pelvic lymph nodes. Reproductive: No mass or other abnormality. Other: No abdominal wall hernia or abnormality. No abdominopelvic ascites. Musculoskeletal: No acute or significant osseous findings. IMPRESSION: 1. No acute noncontrast CT findings of the abdomen or pelvis to explain abdominal distension. 2. Endotracheal intubation. There is air within the lower neck and upper mediastinum adjacent to the left aspect of the trachea, consistent with traumatic intubation or barotrauma. 3. Trace bilateral pleural effusions and associated atelectasis or consolidation. 4.  Emphysema (ICD10-J43.9). 5. Colonic diverticulosis. Thickening of the sigmoid colon, similar in appearance to prior examination, and likely chronic sequelae of prior  diverticulitis. 6. Aortic Atherosclerosis (ICD10-I70.0). Unchanged ectasia of the infrarenal abdominal aorta measuring up to 2.9 x 2.9 cm. Electronically Signed   By: Eddie Candle M.D.   On: 02/01/2020 15:16   DG Chest Portable 1 View  Result Date: 01/18/2020 CLINICAL DATA:  Hypoxia EXAM: PORTABLE CHEST 1 VIEW COMPARISON:  November 13, 2017 FINDINGS: Endotracheal tube tip is 3.4 cm above the carina. No pneumothorax. There is slight scarring in the right apex. There is an equivocal small right pleural effusion. There is no edema or airspace opacity. Heart size and pulmonary vascularity are normal. No adenopathy. Total shoulder replacement noted on the left. Postoperative change noted in the lower cervical spine region. IMPRESSION: Endotracheal tube as described without pneumothorax. Equivocal small right pleural effusion with mild right upper lobe scarring. No edema or airspace opacity. Cardiac silhouette within normal limits. Electronically Signed   By: Lowella Grip III M.D.   On: 01/10/2020 13:48    Procedures Procedure Name: Intubation Date/Time: 01/08/2020 2:33 PM Performed by: Malvin Johns, MD Pre-anesthesia Checklist: Patient identified, Emergency Drugs available, Suction available, Patient being monitored and Timeout performed Oxygen Delivery Method: Ambu bag Preoxygenation: Pre-oxygenation with 100% oxygen Ventilation: Mask ventilation without difficulty Laryngoscope Size: Glidescope and 3 Grade View: Grade III Tube type: Subglottic suction tube Tube size: 7.5 mm Number of attempts: 1 Airway Equipment and Method: Stylet and Video-laryngoscopy Placement Confirmation: ETT inserted through vocal cords under direct vision,  Breath sounds checked- equal and bilateral and Positive ETCO2 Tube secured with: ETT holder Dental Injury: Teeth and Oropharynx as per pre-operative assessment  Difficulty Due To: Difficult Airway- due to reduced neck mobility and Difficulty was  unanticipated      (including critical care time)  Medications Ordered in ED Medications  fentaNYL (SUBLIMAZE) injection 50 mcg (has no administration in time range)  fentaNYL 2558mcg in NS 224mL (15mcg/ml) infusion-PREMIX (50 mcg/hr Intravenous New Bag/Given 01/07/2020 1346)  fentaNYL (SUBLIMAZE) bolus via infusion 50 mcg (has no administration in time range)  phenylephrine 0.4-0.9 MG/10ML-% injection (has no administration in time range)    ED Course  I have reviewed the triage vital signs and the nursing notes.  Pertinent labs & imaging results that were available during my care of the patient were reviewed by me and considered in my medical decision making (see chart for details).    MDM Rules/Calculators/A&P                      14:34 I found a note in the chart from her recent palliative care visit in January of this year which notes that the patient has DNR orders although the son has been hiding them reportedly.  I was able to eventually contact the son by phone following patient's initial resuscitation and he states that he has ripped up the DNR orders.  He does say that he has been making all the medical decisions for the patient but does not have official power of attorney.  Patient is a 59 year old female who presents with altered mental status.  She was completely unresponsive on arrival.  She had had 3 intubation attempts by EMS that were unsuccessful and was being ventilated with a bag valve mask.  I was able to intubate the patient on 1 attempt.  It was a difficult visualization of the glottis but the tube went in without difficulty.  She was placed on a ventilator.  She had a delay in obtaining labs.  She had CT scans including a CT of her head which showed multiple acute infarcts.  Given that has been less than 24 hours with her last known normal being at 3 AM per her son, code stroke was activated.  Dr. Cheral Marker with neurology has seen the patient.  At this point she is not  stable enough to go back and get a CTA or go to IR if needed.  When we attempted to bring her over for the last CT scans, she dropped her pressure and became hypoxic.  This improved with treatment in the CT scanner room.  However at this point we will not bring her back to get another CT scan.  When she is more stable she likely need a CTA.  She was started on Levophed for her hypotension.  She is also given 2 L of IV fluids.  Her CT of her chest noted some mediastinal air.  There is no evidence of pneumothorax.  This could be related to the intubation attempts versus being on the ventilator.  There is no evidence of obvious pneumonia or pneumothorax.  She did likely aspirated as she had some brown material coming from the ET tube on initial intubation.  Her labs showed an anemia as compared to her prior values.  She also had an increased lactate.  I do not see any obvious source of infection.  I have spoken to CCM who will evaluate the patient.  I was going to start the patient on a dose of antibiotics but the CCM doctor is seen the patient and will start antibiotics as appropriate.  CRITICAL CARE Performed by: Malvin Johns Total critical care time: 90 minutes Critical care time was exclusive of separately billable procedures and treating other patients. Critical care was necessary to treat or prevent imminent or life-threatening deterioration. Critical care was time spent personally by me on the following activities: development of treatment plan with patient and/or surrogate as well  as nursing, discussions with consultants, evaluation of patient's response to treatment, examination of patient, obtaining history from patient or surrogate, ordering and performing treatments and interventions, ordering and review of laboratory studies, ordering and review of radiographic studies, pulse oximetry and re-evaluation of patient's condition.  Final Clinical Impression(s) / ED Diagnoses Final diagnoses:   Altered mental status, unspecified altered mental status type  Hypotension, unspecified hypotension type  Cerebrovascular accident (CVA), unspecified mechanism [facility])    Rx / DC Orders ED Discharge Orders    None       Malvin Johns, MD 01/24/2020 1642

## 2020-01-06 NOTE — ED Notes (Signed)
LEVOPHED DRIP LOWERED TO 11 ML

## 2020-01-06 NOTE — ED Triage Notes (Signed)
Patient presents to ED via [facility] from home states patient was laast seen normal at 0300am and her son found her on the bed unresp. thsi afternoon and called 911. Patient was unresp upon ems arrival , was given narcan 2mg  no response.Patient takes a lot of narc and muscle relaxers. Patient had a b/p 60/40 patient was given 400cc fluid and epip drip stared. No gag reflex per ems, however unable to intubate jaw was clenched Dr. Tamera Punt at bedside upon arrival. Patient was intubated with [place] ETTorally  [facility] noitfied [phone]

## 2020-01-06 NOTE — ED Notes (Signed)
THE PT REMAINS OFF THE FENTANYL DRIP NO CHANGE IN CONDITION  NO MOVEMENT   LEVOPHED DECREASED TO 9ML

## 2020-01-06 NOTE — Progress Notes (Signed)
RN and additional RN's attempted placement of OG and NG multiple times without success.  Xray confirmed not in any close range of stomach. Blood was on end of tubing and will recommend cortrak placement instead.

## 2020-01-06 NOTE — Consult Note (Signed)
NEURO HOSPITALIST CONSULT NOTE   Requestig physician: Dr. Tamera Punt  Reason for Consult: Found down unresponsive.  History obtained from:  EDP and Chart     HPI:                                                                                                                                          Stacey Carroll is an 59 y.o. female presenting to the ED unresponsive. LKN was approximately 3 AM per son. After that, she was thought by him to be asleep but at noon he checked on her - she was on the bed and would not wake up. EMS was called and on their arrival she continued to be unresponsive. It was communicated to EMS that the patient takes a lot of narcotics and muscle relaxers. No response to Narcan administered by EMS. She had a b/p of 60/40 and was given 400 cc of fluid with epi drip started. EMS could not obtain a gag reflex. On arrival to the ED she was emergently intubated. Levophed titratable drip was stated in the ED. In CT pulses became undetectable until a right groin pulse was able to be palpated by the CT tech.  CT head revealed multifocal early subacute ischemic infarctions.   Lactate was 5.5 on initial ED assessment. Glucose was 63.   NIHSS = 30  Past Medical History:  Diagnosis Date  . Anxiety   . Arthritis   . Asthma   . Cancer ([place]) 2015   stage 4 throat cancer  . Cough   . Depression   . Dyslipidemia   . Dysphagia 04/27/2014  . Endometriosis   . Hard of hearing   . Headache(784.0)   . Hypercholesterolemia   . Hypertension    not taking meds  . Memory loss   . Nausea alone 05/31/2014  . PONV (postoperative nausea and vomiting)   . Radiation 05/24/14- 07/18/14   Vallecula, Epiglottis, and bilateral neck.   . S/P radiation therapy 05/24/2014-07/18/2014   7000  cGy/ Vallecula/neck  . Scoliosis   . Throat pain 04/19/2014  . Visual disturbance 03/11/2013    Past Surgical History:  Procedure Laterality Date  . ABDOMINAL HYSTERECTOMY  1987  .  CERVICAL LAMINECTOMY  2007 (?), 2011  . GASTROSTOMY N/A 05/18/2014   Procedure: GASTROSTOMY/PLACEMENT OF G-TUBE;  Surgeon: Rolm Bookbinder, MD;  Location: [facility];  Service: General;  Laterality: N/A;  . LARYNGOSCOPY AND ESOPHAGOSCOPY N/A 04/14/2014   Procedure: DIRECT LARYNGOSCOPY WITH BIOPSY  AND ESOPHAGOSCOPY;  Surgeon: Ruby Cola, MD;  Location: [facility];  Service: ENT;  Laterality: N/A;  . PORTACATH PLACEMENT N/A 05/18/2014   Procedure: INSERTION PORT-A-CATH;  Surgeon: Rolm Bookbinder, MD;  Location: [facility];  Service: General;  Laterality: N/A;  . SHOULDER SURGERY Left   .  TRACHEOSTOMY TUBE PLACEMENT N/A 05/18/2014   Procedure: TRACHEOSTOMY;  Surgeon: Melissa Montane, MD;  Location: [facility];  Service: ENT;  Laterality: N/A;    Family History  Problem Relation Age of Onset  . Emphysema Mother   . Cancer Mother        throat ca  . Cancer Father        prostate ca  . Heart disease Father   . Breast cancer Neg Hx               Social History:  reports that she has been smoking cigarettes. She has a 19.50 pack-year smoking history. She has never used smokeless tobacco. She reports current alcohol use. She reports that she does not use drugs.  Allergies  Allergen Reactions  . Penicillins Anaphylaxis and Other (See Comments)    From childhood  . Vancomycin Itching  . Barium Sulfate Itching and Rash    HOME MEDICATIONS:                                                                                                                     No current facility-administered medications on file prior to encounter.   Current Outpatient Medications on File Prior to Encounter  Medication Sig Dispense Refill  . albuterol (ACCUNEB) 0.63 MG/3ML nebulizer solution Take 1 ampule by nebulization every 6 (six) hours as needed for wheezing.    Marland Kitchen amLODipine (NORVASC) 5 MG tablet Take 5 mg by mouth daily.    Marland Kitchen buPROPion (WELLBUTRIN XL) 300 MG 24 hr tablet Take 300 mg by mouth daily.    . diazepam (VALIUM) 10  MG tablet Take 10 mg by mouth 3 (three) times daily as needed for anxiety.     . fluticasone (FLONASE) 50 MCG/ACT nasal spray Place 1 spray into both nostrils daily as needed.     . folic acid (FOLVITE) 1 MG tablet Take 1 mg by mouth daily.    Marland Kitchen HYDROcodone-acetaminophen (NORCO/VICODIN) 5-325 MG tablet Take 2 tablets by mouth every 6 (six) hours as needed for severe pain. (Patient not taking: Reported on 10/25/2019) 8 tablet 0  . lamoTRIgine (LAMICTAL) 100 MG tablet Take 100 mg by mouth daily.    Marland Kitchen LEVOTHYROXINE SODIUM PO Take 100 mcg by mouth daily.     . methylphenidate (RITALIN) 10 MG tablet Take 10 mg by mouth daily.    . ondansetron (ZOFRAN) 4 MG tablet Take 1 tablet (4 mg total) by mouth every 8 (eight) hours as needed for nausea or vomiting. 12 tablet 0  . OVER THE COUNTER MEDICATION Goodies BC powder    . OVER THE COUNTER MEDICATION Fiber capsule once a day; sprinkle on food    . pantoprazole (PROTONIX) 40 MG tablet Take 40 mg by mouth daily.    Marland Kitchen PARoxetine (PAXIL) 40 MG tablet Take 40 mg by mouth 2 (two) times daily.    . pravastatin (PRAVACHOL) 20 MG tablet Take 20 mg by mouth daily.    . traMADol (ULTRAM) 50  MG tablet Take 50 mg by mouth daily.    Marland Kitchen zolpidem (AMBIEN) 10 MG tablet Take 10 mg by mouth at bedtime as needed for sleep.        ROS:                                                                                                                                       Unable to obtain due to unresponsiveness.    Blood pressure 113/82, pulse 93, temperature 98.1 F (36.7 C), resp. rate (!) 25, height 5' (1.524 m), weight 63.5 kg, SpO2 100 %.   General Examination:                                                                                                       Physical Exam  HEENT-  [place]/AT    Lungs- Intubated Extremities- No edema. Skin is pale and cool to touch.   Neurological Examination Mental Status: Intubated. Eyes closed. No spontaneous movements.  Unresponsive to external stimuli except for reflexive responses.  Cranial Nerves: II: No blink to threat. Sluggishly reactive pupils 3 mm >> 2 mm III,IV, VI: Eyes do not open to noxious. No oculocephalic reflex present.  V,VII: Face is flaccidly symmetric, no response to brow ridge pressure VIII: No response to auditory stimuli IX,X: Intubated XI: Unable to assess XII: Intubated Motor/Sensory: Flaccid tone x 4. No movement to pinch in upper and lower extremities. No withdrawal to plantar stimulation bilaterally.  Deep Tendon Reflexes: 0 bilateral upper extremities. 3+ low-amplitude patellar reflexes bilaterally. 2+ achilles reflexes bilaterally. Toes upgoing bilaterally.  Cerebellar/Gait: Unable to assess   Lab Results: Basic Metabolic Panel: Recent Labs  Lab 01/16/2020 1438 01/30/2020 1535  NA 147* 142  K 3.1* 4.1  CL 116*  --   GLUCOSE 63*  --   BUN 10  --   CREATININE 0.80  --     CBC: Recent Labs  Lab 01/08/2020 1432 01/05/2020 1438 01/17/2020 1535  WBC 9.6  --   --   NEUTROABS 8.8*  --   --   HGB 7.9* 8.2* 12.2  HCT 28.1* 24.0* 36.0  MCV 114.2*  --   --   PLT 226  --   --     Cardiac Enzymes: No results for input(s): CKTOTAL, CKMB, CKMBINDEX, TROPONINI in the last 168 hours.  Lipid Panel: No results for input(s): CHOL, TRIG, HDL, CHOLHDL, VLDL, LDLCALC in the last 168 hours.  Imaging: CT ABDOMEN PELVIS WO CONTRAST  Result Date: 01/16/2020 CLINICAL DATA:  Unresponsive, abdominal distension EXAM: CT CHEST, ABDOMEN AND PELVIS WITHOUT CONTRAST TECHNIQUE: Multidetector CT imaging of the chest, abdomen and pelvis was performed following the standard protocol without IV contrast. COMPARISON:  06/11/2019 FINDINGS: CT CHEST FINDINGS Cardiovascular: No significant vascular findings. Normal heart size. No pericardial effusion. Mediastinum/Nodes: No enlarged mediastinal, hilar, or axillary lymph nodes. There is air within the lower neck and upper mediastinum adjacent to the left  aspect of the trachea (series 4, image 25). Thyroid gland and esophagus demonstrate no significant findings. Lungs/Pleura: Endotracheal intubation. Mild centrilobular emphysema. Trace bilateral pleural effusions and associated atelectasis or consolidation. Musculoskeletal: No chest wall mass or suspicious bone lesions identified. CT ABDOMEN PELVIS FINDINGS Hepatobiliary: No solid liver abnormality is seen. No gallstones, gallbladder wall thickening, or biliary dilatation. Pancreas: Unremarkable. No pancreatic ductal dilatation or surrounding inflammatory changes. Spleen: Normal in size without significant abnormality. Adrenals/Urinary Tract: Adrenal glands are unremarkable. Kidneys are normal, without renal calculi, solid lesion, or hydronephrosis. Bladder is unremarkable. Stomach/Bowel: Stomach is within normal limits. Appendix appears normal. Descending and sigmoid diverticulosis. Thickening of the sigmoid colon, similar in appearance to prior examination. Vascular/Lymphatic: Aortic atherosclerosis. Unchanged ectasia of the infrarenal abdominal aorta measuring up to 2.9 x 2.9 cm. No enlarged abdominal or pelvic lymph nodes. Reproductive: No mass or other abnormality. Other: No abdominal wall hernia or abnormality. No abdominopelvic ascites. Musculoskeletal: No acute or significant osseous findings. IMPRESSION: 1. No acute noncontrast CT findings of the abdomen or pelvis to explain abdominal distension. 2. Endotracheal intubation. There is air within the lower neck and upper mediastinum adjacent to the left aspect of the trachea, consistent with traumatic intubation or barotrauma. 3. Trace bilateral pleural effusions and associated atelectasis or consolidation. 4.  Emphysema (ICD10-J43.9). 5. Colonic diverticulosis. Thickening of the sigmoid colon, similar in appearance to prior examination, and likely chronic sequelae of prior diverticulitis. 6. Aortic Atherosclerosis (ICD10-I70.0). Unchanged ectasia of the  infrarenal abdominal aorta measuring up to 2.9 x 2.9 cm. Electronically Signed   By: Eddie Candle M.D.   On: 01/25/2020 15:16   CT Head Wo Contrast  Result Date: 01/05/2020 CLINICAL DATA:  59 year female with altered mental status EXAM: CT HEAD WITHOUT CONTRAST TECHNIQUE: Contiguous axial images were obtained from the base of the skull through the vertex without intravenous contrast. COMPARISON:  MRI 11/29/2017 FINDINGS: Brain: Confluent hypodensity in the right occipital region and parietal lobe extending through the cortex with loss of the Brackeen-white differentiation. Questionable focal hypodensity in right frontal cortex. Loss of the Barrows-white differentiation in focal region left occipital lobe. No midline shift.  No intracranial hemorrhage. Vascular: Intracranial atherosclerosis. Skull: No acute bony abnormality. Sinuses/Orbits: Partial opacification of ethmoid air cells. Mucoperiosteal thickening of the sphenoid air cells. Other: None IMPRESSION: Evolving multifocal infarction, with the largest region in the right PCA territory. Additional regions of infarction include the left parietal region and questionably within the right frontal region. Referral for neurology evaluation is indicated, as well as evaluation with MRI. These results were discussed by telephone at the time of interpretation on 01/22/2020 at 3:16 pm with Dr. Ralene Bathe. Electronically Signed   By: Corrie Mckusick D.O.   On: 01/21/2020 15:23   CT CHEST WO CONTRAST  Result Date: 01/21/2020 CLINICAL DATA:  Unresponsive, abdominal distension EXAM: CT CHEST, ABDOMEN AND PELVIS WITHOUT CONTRAST TECHNIQUE: Multidetector CT imaging of the chest, abdomen and pelvis was performed following the standard protocol without IV contrast. COMPARISON:  06/11/2019 FINDINGS: CT CHEST FINDINGS Cardiovascular: No  significant vascular findings. Normal heart size. No pericardial effusion. Mediastinum/Nodes: No enlarged mediastinal, hilar, or axillary lymph nodes.  There is air within the lower neck and upper mediastinum adjacent to the left aspect of the trachea (series 4, image 25). Thyroid gland and esophagus demonstrate no significant findings. Lungs/Pleura: Endotracheal intubation. Mild centrilobular emphysema. Trace bilateral pleural effusions and associated atelectasis or consolidation. Musculoskeletal: No chest wall mass or suspicious bone lesions identified. CT ABDOMEN PELVIS FINDINGS Hepatobiliary: No solid liver abnormality is seen. No gallstones, gallbladder wall thickening, or biliary dilatation. Pancreas: Unremarkable. No pancreatic ductal dilatation or surrounding inflammatory changes. Spleen: Normal in size without significant abnormality. Adrenals/Urinary Tract: Adrenal glands are unremarkable. Kidneys are normal, without renal calculi, solid lesion, or hydronephrosis. Bladder is unremarkable. Stomach/Bowel: Stomach is within normal limits. Appendix appears normal. Descending and sigmoid diverticulosis. Thickening of the sigmoid colon, similar in appearance to prior examination. Vascular/Lymphatic: Aortic atherosclerosis. Unchanged ectasia of the infrarenal abdominal aorta measuring up to 2.9 x 2.9 cm. No enlarged abdominal or pelvic lymph nodes. Reproductive: No mass or other abnormality. Other: No abdominal wall hernia or abnormality. No abdominopelvic ascites. Musculoskeletal: No acute or significant osseous findings. IMPRESSION: 1. No acute noncontrast CT findings of the abdomen or pelvis to explain abdominal distension. 2. Endotracheal intubation. There is air within the lower neck and upper mediastinum adjacent to the left aspect of the trachea, consistent with traumatic intubation or barotrauma. 3. Trace bilateral pleural effusions and associated atelectasis or consolidation. 4.  Emphysema (ICD10-J43.9). 5. Colonic diverticulosis. Thickening of the sigmoid colon, similar in appearance to prior examination, and likely chronic sequelae of prior  diverticulitis. 6. Aortic Atherosclerosis (ICD10-I70.0). Unchanged ectasia of the infrarenal abdominal aorta measuring up to 2.9 x 2.9 cm. Electronically Signed   By: Eddie Candle M.D.   On: 02/02/2020 15:16   DG Chest Portable 1 View  Result Date: 01/23/2020 CLINICAL DATA:  Hypoxia EXAM: PORTABLE CHEST 1 VIEW COMPARISON:  November 13, 2017 FINDINGS: Endotracheal tube tip is 3.4 cm above the carina. No pneumothorax. There is slight scarring in the right apex. There is an equivocal small right pleural effusion. There is no edema or airspace opacity. Heart size and pulmonary vascularity are normal. No adenopathy. Total shoulder replacement noted on the left. Postoperative change noted in the lower cervical spine region. IMPRESSION: Endotracheal tube as described without pneumothorax. Equivocal small right pleural effusion with mild right upper lobe scarring. No edema or airspace opacity. Cardiac silhouette within normal limits. Electronically Signed   By: Lowella Grip III M.D.   On: 01/20/2020 13:48    Assessment: 59 year old female found unresponsive at home. Hemodynamically unstable and required intubation for airway protection.  1. Exam reveals a comatose patient without volitional responses to external stimuli.  2. CT head reveals evolving multifocal infarctions, with the largest region in the right PCA territory. Additional regions of infarction include the left parietal region and questionably within the right frontal region.  3. Discussed CT with Dr. Earleen Newport. ASPECTS is 7.  4. On Lamictal at a low dose at home for unknown indication. No history of seizures listed in Epic.  5. The stroke burden on CT is sufficiently extensive to at least partially explain her obtundation. Most likely etiology is cardioembolic. Based on the exam, other acute infarcts may be present that are not yet detectable; findings on exam also suggestive of a possible brainstem stroke 6. Patient per report had a document at  home stating that she is DNR, but her son  apparently tore it up.  7. Currently the patient is not hemodynamically stable to the extent that she can tolerate CTA/CTP with a sufficiently low risk of not coding. She has an even lower chance of being able to have an endovascular procedure without ensuing severe hypotension or cardiac arrest. Discussed with Dr. Tamera Punt and Dr. Lamonte Sakai and we are in consensus regarding this.  8. No evidence for seizure activity or meningitis on exam.   Recommendations: 1. If patient becomes more stable can consider CTA/CTP. 24 hour time window began at 3 AM today.  2. At some point an MRI brain should also be obtained, provided that she is stable enough.  3. ASA 325 mg per OGT 4. Continue Lamictal per OGT.  5. BP and ventilator management per CCM  45 minutes spent in the emergent neurological evaluation and management of this critically ill patient.   Electronically signed: Dr. Kerney Elbe 01/29/2020, 4:03 PM

## 2020-01-06 NOTE — Progress Notes (Signed)
CRITICAL VALUE ALERT  Critical Value:  Troponin 118  Date & Time Notied:  01/31/2020 2032  Provider Notified: Warren Lacy RN   Orders Received/Actions taken: awaiting new orders

## 2020-01-06 NOTE — Progress Notes (Signed)
CDS called back in reference to status-  The patient is not a candidate for donation due to medical history   If she continues to decline, please call CDS with cardiac time of death

## 2020-01-06 NOTE — ED Notes (Signed)
Dr Pauline Aus in the room  Pa for critiacl care at   The bedside

## 2020-01-06 NOTE — Progress Notes (Signed)
[facility] Progress Note Patient Name: Stacey Carroll DOB: June 06, 1961 MRN: [id number]   Date of Service  01/25/2020  HPI/Events of Note  Patient on norepinephrine, titrating down, but order has fallen off the list  eICU Interventions  Re-ordered     Intervention Category Minor Interventions: Other:  Judd Lien 01/17/2020, 8:10 PM

## 2020-01-06 NOTE — Progress Notes (Signed)
[facility] Progress Note Patient Name: Stacey Carroll DOB: 1961/08/26 MRN: [id number]   Date of Service  01/25/2020  HPI/Events of Note  Notified of troponin 118 and temp 99.8 EKG reviewed without significant change Titrating down norepinephrine  eICU Interventions  ACS less likely. Will repeat troponin in am PRN Tylenol ordered     Intervention Category Intermediate Interventions: Other:  Judd Lien 01/24/2020, 9:30 PM

## 2020-01-06 NOTE — ED Notes (Signed)
1329 Levo drip started right f/a @ 10 mcq 1334 increased levo 30 mcq[ 69/47 1341 decreased levo 20 mcq  106/75 1342 decreased Levo 120 mcq 105/74

## 2020-01-06 NOTE — ED Notes (Signed)
The pts  Levophed has been  Decreased to 12 ml iv

## 2020-01-06 NOTE — ED Notes (Signed)
LEVOPHED LOWERED TO 10ML

## 2020-01-06 NOTE — ED Notes (Signed)
Code stroke activated per Dr. Ozzie Hoyle request

## 2020-01-06 NOTE — Code Documentation (Signed)
Pt was admitted to ED from EMS as unresponsive. Intubated in ED. Taken to CT. CT showing stroke per radiologist, therefore, Code stroke alerted at 1533. Pt was last known well at 0300 per her son. Stroke team to bedside at 1540. Neurologist to bedside at 1543. Pt GCS 6, withdraws from pain in bilateral lower extremities. Pt has cough and gag reflex. NIHSS 30. MDs [facility] assessed patient. Awaiting CCM evaluation as pt is hypotensive and lost pulse briefly during prior scan. Continue close monitoring while still treatment window, 0300 01/07/2020. Plan discussed with Gerald Stabs, RN.

## 2020-01-06 NOTE — ED Notes (Signed)
Spoke with Nic from [facility] please called with plan of patient care when available [phone]

## 2020-01-06 NOTE — H&P (Signed)
PULMONARY / CRITICAL CARE MEDICINE   NAME:  Stacey Carroll, MRN:  [id number], DOB:  02-21-1961, LOS: 0 ADMISSION DATE:  01/20/2020, CONSULTATION DATE: 01/22/2020 REFERRING MD: Loletta Specter, MD, CHIEF COMPLAINT: Acute encephalopathy  BRIEF HISTORY:    Stacey Carroll is a 59 y.o. female with a pertinent PMH of hypothyroidism, GERD, oropharyngeal cancer, dysphagia, anemia of chronic disease who presents to [facility] after being found unresponsive in her home today.    HISTORY OF PRESENT ILLNESS   Stacey Carroll is a 59 y.o. female with a pertinent PMH of hypothyroidism, GERD, oropharyngeal cancer, dysphagia, anemia of chronic disease who presents to [facility] after being found unresponsive face down in bed by her son at 81 (noon) today. Patient's last known normal was at 3 am. The patient lives with her son who manages all of her medications. He she has not complained of any new symptoms leading up to this hospitalization. He denies any recent infections, chest pain, focal neurologic deficits. He does states that she just recently saw her doctor and was put on some new centrally acting medications that he believes may have contributed to her somnolence such as suboxone, gabapentin, and methylphenidate. In route to the [facility], EMTs performed multiple attempts at intubating her without success. In the ED, the patient was hypotensive and only responsive to noxious stimuli.she was intubated without paralytics. She was given 2 liters of fluids and started on phenylephrine gtt for hypotension.   SIGNIFICANT PAST MEDICAL HISTORY   Hypothyroid GERD Oropharyngeal cancer Dysphagia Anemia of chronic disease  SIGNIFICANT EVENTS:  04/02 rapid sequence intubation> 04/02 chest x-ray shows no signs of pneumothorax> 04/02 pneumomediastinum> 04/02 evolving multifocal infarct of the right PCA left parietal and possibly right frontal regions.>  STUDIES:   Chest XR: Endotracheal tube as described without pneumothorax. Equivocal  small right pleural effusion with mild right upper lobe scarring. No edema or airspace opacity. Cardiac silhouette within normal limits.  CT head: Evolving multifocal infarction, with the largest region in the right PCA territory. Additional regions of infarction include the left parietal region and questionably within the right frontal region. Referral for neurology evaluation is indicated, as well as evaluation with MRI.  Chest and abdomen: 1. No acute noncontrast CT findings of the abdomen or pelvis to explain abdominal distension.  2. Endotracheal intubation. There is air within the lower neck and upper mediastinum adjacent to the left aspect of the trachea, consistent with traumatic intubation or barotrauma.  3. Trace bilateral pleural effusions and associated atelectasis or consolidation.  4.  Emphysema (ICD10-J43.9).  5. Colonic diverticulosis. Thickening of the sigmoid colon, similar in appearance to prior examination, and likely chronic sequelae of prior diverticulitis.  6. Aortic Atherosclerosis (ICD10-I70.0). Unchanged ectasia of the infrarenal abdominal aorta measuring up to 2.9 x 2.9 cm. CULTURES:  2X blood cultures>  ANTIBIOTICS:  None  LINES/TUBES:  04/02 ET tube 04/02 peripheral line left hand, right hand, right forearm, right antecubital space> 04/02 urethral catheter CONSULTANTS:  04/02 Neurology SUBJECTIVE:  Patient minimally responsive to noxious stimuli.  CONSTITUTIONAL: BP 103/75   Pulse 91   Temp 98.5 F (36.9 C)   Resp (!) 25   Ht 5' (1.524 m)   Wt 63.5 kg   SpO2 100%   BMI 27.34 kg/m   No intake/output data recorded.     Vent Mode: PRVC FiO2 (%):  [50 %-100 %] 50 % Set Rate:  [18 bmp-24 bmp] 24 bmp Vt Set:  [470 mL] 470 mL PEEP:  [5 [id number]  cmH20] 5 cmH20 Plateau Pressure:  [19 [id number] cmH20] 24 cmH20  PHYSICAL EXAM: Physical Exam  Constitutional: She appears malnourished. She appears unhealthy. She has a sickly  appearance.  HENT:  Head: Normocephalic and atraumatic.  Eyes:  Pupils were round but constricted and minimally reactive to light  Pulmonary/Chest:  Patient's lung sounds were obscured by ventilator.  Abdominal: Soft. She exhibits no distension.  Musculoskeletal:        General: No edema.  Skin: She is not diaphoretic.  Patient's extremities were cool and dry with 2+ pulses and less than 3-second capillary refill bilaterally    RESOLVED PROBLEM LIST   ASSESSMENT AND PLAN    Acute encephalopathy: Acute CVA: Patient is minimally responsive on exam. CT scan shows multiple areas of infarct concerning for possible etiology of her acute encephalopathy secondary to large stroke burden. Patient also has multiple centrally acting medications with a subjective history of new or changing medications in the recent past might be contributing to her encephalopathy. She was started on fentanyl in the ED, but we will wean that off at this time to see if she will become more responsive. Considering patient is hypotensive and minimally responsive, she would only likely not tolerate mechanical thrombectomy of her CVAs. -Wean fentanyl -Aspirin 325 crush through ET tube. - Permissive HTN - Echocardiogram - Hgb A1c pending - Lipid panel pending - Continue telemetry - Considering risk of hemorrhagic conversion of her strokes we will keep the patient on SCDs for DVT prophylaxis. - Hold centrally acting medications in the setting of acute encephalopathy. -Supplement folic acid, B1 -Start high intensity statin, atorvastatin 80 mg. -Repeat full set of labs.  Shock Patient presents with hypotension with clinical findings of cold extremities concerning for hypovolemic shock. She does not have any overt signs of infection to indicate sepsis such as leukocytosis. She does present with an elevated lactic acidosis of 5.5 in the setting of acute hypoxic respiratory failure requiring intubation. ABG shows evidence  of respiratory acidosis. We will continue volume resuscitation and and phenylephrine gtt. wean pressors as tolerated. - Continue phenylephrine gtt., wean if possible - Continue fluid resuscitation. - Echocardiogram to assess cardiac function  Hypertension: Hold all antihypertensive medications at this time.  Permissive hypertension.   -As needed labetalol for systolic blood pressures greater than [id number], diastolic blood pressures greater than 120.  Hypothyroidism -Continue outpatient levothyroxine  SUMMARY OF TODAY'S PLAN:  Continue work-up for stroke and stabilize hypotension.  Best Practice / Goals of Care / Disposition.   DVT PROPHYLAXIS: SCD  NUTRITION:NPO MOBILITY:Bedrest GOALS OF CARE:Full code FAMILY DISCUSSIONS: Spoke to patients son today and updated him on her status. 04/02 DISPOSITION pending further evaluation and management   LABS  Glucose Recent Labs  Lab 01/15/2020 1326  GLUCAP 86    BMET Recent Labs  Lab 01/18/2020 1438 02/02/2020 1535  NA 147* 142  K 3.1* 4.1  CL 116*  --   BUN 10  --   CREATININE 0.80  --   GLUCOSE 63*  --     Liver Enzymes No results for input(s): AST, ALT, ALKPHOS, BILITOT, ALBUMIN in the last 168 hours.  Electrolytes No results for input(s): CALCIUM, MG, PHOS in the last 168 hours.  CBC Recent Labs  Lab 01/12/2020 1432 01/08/2020 1438 01/26/2020 1535  WBC 9.6  --   --   HGB 7.9* 8.2* 12.2  HCT 28.1* 24.0* 36.0  PLT 226  --   --     ABG Recent Labs  Lab 01/15/2020 1535  PHART 7.195*  PCO2ART 56.0*  PO2ART 366.0*    Coag's No results for input(s): APTT, INR in the last 168 hours.  Sepsis Markers Recent Labs  Lab 01/20/2020 1433  LATICACIDVEN 5.5*    Cardiac Enzymes No results for input(s): TROPONINI, PROBNP in the last 168 hours.  PAST MEDICAL HISTORY :   She  has a past medical history of Anxiety, Arthritis, Asthma, Cancer ([place]) (2015), Cough, Depression, Dyslipidemia, Dysphagia (04/27/2014), Endometriosis, Hard of  hearing, Headache(784.0), Hypercholesterolemia, Hypertension, Memory loss, Nausea alone (05/31/2014), PONV (postoperative nausea and vomiting), Radiation (05/24/14- 07/18/14), S/P radiation therapy (05/24/2014-07/18/2014), Scoliosis, Throat pain (04/19/2014), and Visual disturbance (03/11/2013).  PAST SURGICAL HISTORY:  She  has a past surgical history that includes Cervical laminectomy (2007 (?), 2011); Shoulder surgery (Left); Abdominal hysterectomy (1987); Laryngoscopy and esophagoscopy (N/A, 04/14/2014); Portacath placement (N/A, 05/18/2014); Gastrostomy (N/A, 05/18/2014); and Tracheostomy tube placement (N/A, 05/18/2014).  Allergies  Allergen Reactions  . Penicillins Anaphylaxis and Other (See Comments)    From childhood  . Vancomycin Itching  . Barium Sulfate Itching and Rash    No current facility-administered medications on file prior to encounter.   Current Outpatient Medications on File Prior to Encounter  Medication Sig  . albuterol (ACCUNEB) 0.63 MG/3ML nebulizer solution Take 1 ampule by nebulization every 6 (six) hours as needed for wheezing.  Marland Kitchen amLODipine (NORVASC) 5 MG tablet Take 5 mg by mouth daily.  Marland Kitchen buPROPion (WELLBUTRIN XL) 300 MG 24 hr tablet Take 300 mg by mouth daily.  . diazepam (VALIUM) 10 MG tablet Take 10 mg by mouth 3 (three) times daily as needed for anxiety.   . fluticasone (FLONASE) 50 MCG/ACT nasal spray Place 1 spray into both nostrils daily as needed.   . folic acid (FOLVITE) 1 MG tablet Take 1 mg by mouth daily.  Marland Kitchen HYDROcodone-acetaminophen (NORCO/VICODIN) 5-325 MG tablet Take 2 tablets by mouth every 6 (six) hours as needed for severe pain. (Patient not taking: Reported on 10/25/2019)  . lamoTRIgine (LAMICTAL) 100 MG tablet Take 100 mg by mouth daily.  Marland Kitchen LEVOTHYROXINE SODIUM PO Take 100 mcg by mouth daily.   . methylphenidate (RITALIN) 10 MG tablet Take 10 mg by mouth daily.  . ondansetron (ZOFRAN) 4 MG tablet Take 1 tablet (4 mg total) by mouth every 8 (eight)  hours as needed for nausea or vomiting.  Marland Kitchen OVER THE COUNTER MEDICATION Goodies BC powder  . OVER THE COUNTER MEDICATION Fiber capsule once a day; sprinkle on food  . pantoprazole (PROTONIX) 40 MG tablet Take 40 mg by mouth daily.  Marland Kitchen PARoxetine (PAXIL) 40 MG tablet Take 40 mg by mouth 2 (two) times daily.  . pravastatin (PRAVACHOL) 20 MG tablet Take 20 mg by mouth daily.  . traMADol (ULTRAM) 50 MG tablet Take 50 mg by mouth daily.  Marland Kitchen zolpidem (AMBIEN) 10 MG tablet Take 10 mg by mouth at bedtime as needed for sleep.    FAMILY HISTORY:   Her family history includes Cancer in her father and mother; Emphysema in her mother; Heart disease in her father. There is no history of Breast cancer.  SOCIAL HISTORY:  She  reports that she has been smoking cigarettes. She has a 19.50 pack-year smoking history. She has never used smokeless tobacco. She reports current alcohol use. She reports that she does not use drugs.  REVIEW OF SYSTEMS:    Patient was unable to participate due to unresponsiveness.

## 2020-01-06 NOTE — ED Notes (Signed)
Patient transported to CT while in CT @ 1435 pulses were very faint Dr. Tamera Punt at bedside Levo increased to 20 mcq  92/78

## 2020-01-06 NOTE — ED Notes (Signed)
Report called to rn on 4n 

## 2020-01-07 ENCOUNTER — Inpatient Hospital Stay (HOSPITAL_COMMUNITY): Payer: Medicare Other

## 2020-01-07 ENCOUNTER — Other Ambulatory Visit (HOSPITAL_COMMUNITY): Payer: Medicare Other

## 2020-01-07 DIAGNOSIS — R579 Shock, unspecified: Secondary | ICD-10-CM

## 2020-01-07 DIAGNOSIS — J9601 Acute respiratory failure with hypoxia: Secondary | ICD-10-CM

## 2020-01-07 DIAGNOSIS — I63419 Cerebral infarction due to embolism of unspecified middle cerebral artery: Secondary | ICD-10-CM

## 2020-01-07 DIAGNOSIS — I6389 Other cerebral infarction: Secondary | ICD-10-CM

## 2020-01-07 LAB — BASIC METABOLIC PANEL
Anion gap: 11 (ref 5–15)
BUN: 21 mg/dL — ABNORMAL HIGH (ref 6–20)
CO2: 18 mmol/L — ABNORMAL LOW (ref 22–32)
Calcium: 8.2 mg/dL — ABNORMAL LOW (ref 8.9–10.3)
Chloride: 109 mmol/L (ref 98–111)
Creatinine, Ser: 1.24 mg/dL — ABNORMAL HIGH (ref 0.44–1.00)
GFR calc Af Amer: 55 mL/min — ABNORMAL LOW (ref >60)
GFR calc non Af Amer: 48 mL/min — ABNORMAL LOW (ref >60)
Glucose, Bld: 125 mg/dL — ABNORMAL HIGH (ref 70–99)
Potassium: 3.5 mmol/L (ref 3.5–5.1)
Sodium: 138 mmol/L (ref 135–145)

## 2020-01-07 LAB — CBC
HCT: 32.2 % — ABNORMAL LOW (ref 36.0–46.0)
Hemoglobin: 10.5 g/dL — ABNORMAL LOW (ref 12.0–15.0)
MCH: 31.3 pg (ref 26.0–34.0)
MCHC: 32.6 g/dL (ref 30.0–36.0)
MCV: 96.1 fL (ref 80.0–100.0)
Platelets: 309 10*3/uL (ref 150–400)
RBC: 3.35 MIL/uL — ABNORMAL LOW (ref 3.87–5.11)
RDW: 15.5 % (ref 11.5–15.5)
WBC: 9.1 10*3/uL (ref 4.0–10.5)
nRBC: 0 % (ref 0.0–0.2)

## 2020-01-07 LAB — HEMOGLOBIN A1C
Hgb A1c MFr Bld: 6.3 % — ABNORMAL HIGH (ref 4.8–5.6)
Mean Plasma Glucose: 134.11 mg/dL

## 2020-01-07 LAB — POCT I-STAT 7, (LYTES, BLD GAS, ICA,H+H)
Acid-base deficit: 4 mmol/L — ABNORMAL HIGH (ref 0.0–2.0)
Bicarbonate: 18.5 mmol/L — ABNORMAL LOW (ref 20.0–28.0)
Calcium, Ion: 1.17 mmol/L (ref 1.15–1.40)
HCT: 30 % — ABNORMAL LOW (ref 36.0–46.0)
Hemoglobin: 10.2 g/dL — ABNORMAL LOW (ref 12.0–15.0)
O2 Saturation: 100 %
Patient temperature: 101.2
Potassium: 3.4 mmol/L — ABNORMAL LOW (ref 3.5–5.1)
Sodium: 140 mmol/L (ref 135–145)
TCO2: 19 mmol/L — ABNORMAL LOW (ref 22–32)
pCO2 arterial: 27.7 mmHg — ABNORMAL LOW (ref 32.0–48.0)
pH, Arterial: 7.437 (ref 7.350–7.450)
pO2, Arterial: 178 mmHg — ABNORMAL HIGH (ref 83.0–108.0)

## 2020-01-07 LAB — ECHOCARDIOGRAM COMPLETE
Height: 60 in
Weight: 2240 oz

## 2020-01-07 LAB — TROPONIN I (HIGH SENSITIVITY): Troponin I (High Sensitivity): 155 ng/L (ref <18)

## 2020-01-07 MED ORDER — LACTATED RINGERS IV SOLN: INTRAVENOUS | Status: DC

## 2020-01-07 MED ORDER — LACTATED RINGERS IV BOLUS
250.0000 mL | Freq: Once | INTRAVENOUS | Status: AC
  Administered 2020-01-07: 250 mL via INTRAVENOUS

## 2020-01-07 MED ORDER — ASPIRIN 300 MG RE SUPP
300.0000 mg | Freq: Every day | RECTAL | Status: DC
  Administered 2020-01-07 – 2020-01-08 (×2): 300 mg via RECTAL
  Filled 2020-01-07 (×3): qty 1

## 2020-01-07 MED ORDER — ORAL CARE MOUTH RINSE
15.0000 mL | OROMUCOSAL | Status: DC
  Administered 2020-01-07 – 2020-01-09 (×25): 15 mL via OROMUCOSAL

## 2020-01-07 MED ORDER — CHLORHEXIDINE GLUCONATE CLOTH 2 % EX PADS
6.0000 | MEDICATED_PAD | Freq: Every day | CUTANEOUS | Status: DC
  Administered 2020-01-07: 6 via TOPICAL

## 2020-01-07 MED ORDER — CHLORHEXIDINE GLUCONATE 0.12% ORAL RINSE (MEDLINE KIT)
15.0000 mL | Freq: Two times a day (BID) | OROMUCOSAL | Status: DC
  Administered 2020-01-07 – 2020-01-09 (×5): 15 mL via OROMUCOSAL

## 2020-01-07 NOTE — Progress Notes (Signed)
[facility] Progress Note Patient Name: Stacey Carroll DOB: [birth year] MRN: [id number]   Date of Service  01/07/2020  HPI/Events of Note  Oliguria, has been off levophed but is not currently on fluids  eICU Interventions  LR bolus and LR at 75 cc/hour after that      Intervention Category Intermediate Interventions: Oliguria - evaluation and management  Margaretmary Lombard 01/07/2020, 6:02 AM

## 2020-01-07 NOTE — Progress Notes (Signed)
NAME:  Stacey Carroll, MRN:  [id number], DOB:  Feb 23, 1961, LOS: 1 ADMISSION DATE:  02/02/2020,  REFERRING MD:  EDP, CHIEF COMPLAINT:  encephalopathy  Brief History   Stacey Carroll is a 59 y.o. woman with history of oropharyngeal cancer in remission, dysphagia, chronic pain on multiple pain medications who was found unresponsive on 01/15/2020 at home. Last known well 9 hours prior to discovery. Difficult airway with multiple attempts at securing airway in the field. Arrived to [facility] in shock. Head CT demonstrated multifocal subacute strokes. Admitted to 4N ICU for further management  Consults:  neurology  Procedures:  4/2 Intubation  Significant Diagnostic Tests:  Head CT 4/2  Evolving multifocal infarction, with the largest region in the right PCA territory. Additional regions of infarction include the left parietal region and questionably within the right frontal region. Referral for neurology evaluation is indicated, as well as evaluation with MRI.  Micro Data:  Blood cultures 4/2 pending Respiratory pane 4/2 negative (flu A/B, Covid19)  Antimicrobials:  none  Interim history/subjective:  Overnight off all vasoactive agents. Have been unable to obtain enteral access. Not responsive. Has cough and gag  Objective   Blood pressure 120/81, pulse 79, temperature 99.4 F (37.4 C), temperature source Axillary, resp. rate (!) 24, height 5' (1.524 m), weight 63.5 kg, SpO2 100 %.    Vent Mode: PRVC FiO2 (%):  [40 %-100 %] 40 % Set Rate:  [18 bmp-24 bmp] 24 bmp Vt Set:  [470 mL-480 mL] 480 mL PEEP:  [5 cmH20-8 cmH20] 5 cmH20 Plateau Pressure:  [14 cmH20-24 cmH20] 14 cmH20   Intake/Output Summary (Last 24 hours) at 01/07/2020 0756 Last data filed at 01/07/2020 0700 Gross per 24 hour  Intake 325.68 ml  Output 750 ml  Net -424.32 ml   Filed Weights   01/12/2020 1334  Weight: 63.5 kg    Examination: General: intubated HENT: ETT to vent Lungs: breathes over vent.   Cardiovascular:  RRR Abdomen: soft Extremities: no edema Neuro: not sedated. Not responsive. +cough and gag  Resolved Hospital Problem list   shock  Assessment & Plan:  Acute CVA in PCA territory - hold sedation - Aspirin 300 mg suppository.  - Permissive HTN - Echocardiogram completed, read pending. -  Hgb A1c pending - Lipid panel pending - lamictal, statin held due to lack of enteral access, will re-attempt today - neuro following. Now that she is stable will order MRI  Acute hypoxemic respiratory failure - intubated 4/2 for clinical coma - lung protective ventilation - daily SBT/SAT  AKI - baseline Cr around 0.8 - suspect pre-renal etiology due to shock - uop ok,   Circulatory Shock - resolved  Elevated Troponin - suspect demand ischemia - follow up echocardiogram  Best practice:  Diet: NPO will start tube feeds today once enteral access can be obtained.  Pain/Anxiety/Delirium protocol (if indicated): holding all sedation for mental status. VAP protocol (if indicated): ordered DVT prophylaxis: SCDs GI prophylaxis: pepcid Glucose control: controlled<180 Foley required for Mobility: bed rest Code Status: full. Patient has apparently been DNR in the past but this was rescinded by son during admission. He has expressed to staff she would not want trach and peg. Family Communication: son  Updated at bedside. Disposition: requires ICU.    Labs   CBC: Recent Labs  Lab 01/12/2020 1432 01/08/2020 1432 01/23/2020 1438 01/08/2020 1535 02/01/2020 2034 01/07/20 0443 01/07/20 0620  WBC 9.6  --   --   --  14.4*  --  9.1  NEUTROABS 8.8*  --   --   --  12.4*  --   --   HGB 7.9*   < > 8.2* 12.2 12.2 10.2* 10.5*  HCT 28.1*   < > 24.0* 36.0 37.5 30.0* 32.2*  MCV 114.2*  --   --   --  97.4  --  96.1  PLT 226  --   --   --  366  --  309   < > = values in this interval not displayed.    Basic Metabolic Panel: Recent Labs  Lab 01/08/2020 1438 01/08/2020 1535 02/01/2020 1909 01/07/20 0443  01/07/20 0620  NA 147* 142 142 140 138  K 3.1* 4.1 4.1 3.4* 3.5  CL 116*  --  109  --  109  CO2  --   --  17*  --  18*  GLUCOSE 63*  --  119*  --  125*  BUN 10  --  14  --  21*  CREATININE 0.80  --  1.26*  --  1.24*  CALCIUM  --   --  8.1*  --  8.2*  MG  --   --  1.7  --   --   PHOS  --   --  2.4*  --   --    GFR: Estimated Creatinine Clearance: 40.6 mL/min (A) (by C-G formula based on SCr of 1.24 mg/dL (H)). Recent Labs  Lab 01/10/2020 1432 01/18/2020 1433 01/11/2020 1909 01/31/2020 2034 01/07/20 0620  WBC 9.6  --   --  14.4* 9.1  LATICACIDVEN  --  5.5* 3.1*  --   --     Liver Function Tests: Recent Labs  Lab 01/11/2020 1909  AST 494*  ALT 304*  ALKPHOS 112  BILITOT 0.4  PROT 6.0*  ALBUMIN 3.1*   No results for input(s): LIPASE, AMYLASE in the last 168 hours. No results for input(s): AMMONIA in the last 168 hours.  ABG    Component Value Date/Time   PHART 7.437 01/07/2020 0443   PCO2ART 27.7 (L) 01/07/2020 0443   PO2ART 178.0 (H) 01/07/2020 0443   HCO3 18.5 (L) 01/07/2020 0443   TCO2 19 (L) 01/07/2020 0443   ACIDBASEDEF 4.0 (H) 01/07/2020 0443   O2SAT 100.0 01/07/2020 0443     Coagulation Profile: No results for input(s): INR, PROTIME in the last 168 hours.  Cardiac Enzymes: Recent Labs  Lab 01/29/2020 1909  CKTOTAL 917*    HbA1C: No results found for: HGBA1C  CBG: Recent Labs  Lab 01/28/2020 1326  GLUCAP 86    Critical care time:   The patient is critically ill with multiple organ systems failure and requires high complexity decision making for assessment and support, frequent evaluation and titration of therapies, application of advanced monitoring technologies and extensive interpretation of multiple databases.   Critical Care Time devoted to patient care services described in this note is 35 minutes. This time reflects the time of my personal involvement. This critical care time does not reflect separately billable procedures or procedure time,  teaching time or supervisory time of PA/NP/Med student/Med Resident etc but could involve care discussion time.  Leone [facility] 01/07/2020 7:56 AM  Pager: [phone] After hours pager: [phone]

## 2020-01-07 NOTE — Progress Notes (Signed)
*  PRELIMINARY RESULTS* Echocardiogram 2D Echocardiogram has been performed.  Leavy Cella 01/07/2020, 11:01 AM

## 2020-01-07 NOTE — Progress Notes (Signed)
RT NOTES: Patient transported to MRI and back to room 4N15 on vent without complications.

## 2020-01-08 ENCOUNTER — Inpatient Hospital Stay (HOSPITAL_COMMUNITY): Payer: Medicare Other

## 2020-01-08 DIAGNOSIS — I63413 Cerebral infarction due to embolism of bilateral middle cerebral arteries: Secondary | ICD-10-CM

## 2020-01-08 DIAGNOSIS — N179 Acute kidney failure, unspecified: Secondary | ICD-10-CM

## 2020-01-08 LAB — SODIUM
Sodium: 140 mmol/L (ref 135–145)
Sodium: 144 mmol/L (ref 135–145)
Sodium: 149 mmol/L — ABNORMAL HIGH (ref 135–145)

## 2020-01-08 MED ORDER — SODIUM CHLORIDE 3 % IV SOLN
INTRAVENOUS | Status: DC
  Administered 2020-01-08 (×2): 50 mL/h via INTRAVENOUS
  Filled 2020-01-08 (×4): qty 500

## 2020-01-08 NOTE — Progress Notes (Signed)
NAME:  Stacey Carroll, MRN:  [id number], DOB:  08/27/1961, LOS: 2 ADMISSION DATE:  01/24/2020,  REFERRING MD:  EDP, CHIEF COMPLAINT:  encephalopathy  Brief History   Stacey Carroll is a 59 y.o. woman with history of oropharyngeal cancer in remission, dysphagia, chronic pain on multiple pain medications who was found unresponsive on 01/27/2020 at home. Last known well 9 hours prior to discovery. Difficult airway with multiple attempts at securing airway in the field. Arrived to [facility] in shock. Head CT demonstrated multifocal subacute strokes. Admitted to 4N ICU for further management  Consults:  neurology  Procedures:  4/2 Intubation  Significant Diagnostic Tests:  Head CT 4/2  Evolving multifocal infarction, with the largest region in the right PCA territory. Additional regions of infarction include the left parietal region and questionably within the right frontal region. Referral for neurology evaluation is indicated, as well as evaluation with MRI.  MRI Brain 4/3 Large areas of restricted diffusion are present involving bilateral cerebral hemispheres, right greater than left, in MCA and PCA territories. There is involvement of the right greater than left deep Tonnesen nuclei as well as the right greater than left cerebellar hemispheres. ACA territory is relatively spared instead with bilateral involvement of ACA/MCA watershed territory. Some of the infarcts demonstrate corresponding susceptibility consistent with petechial hemorrhage.  Echocardiogram 4/3  1. Left ventricular ejection fraction, by estimation, is 60 to 65%. The  left ventricle has normal function. The left ventricle has no regional  wall motion abnormalities. There is mild left ventricular hypertrophy.  Left ventricular diastolic parameters  are consistent with Grade I diastolic dysfunction (impaired relaxation).  2. Right ventricular systolic function is mildly reduced. The right  ventricular size is normal.    3. The mitral valve is abnormal. Trivial mitral valve regurgitation.  4. The aortic valve is tricuspid. Aortic valve regurgitation is not  visualized.  5. The inferior vena cava is normal in size with greater than 50%  respiratory variability, suggesting right atrial pressure of 3 mmHg.   Micro Data:  Blood cultures 4/2 pending Respiratory pane 4/2 negative (flu A/B, Covid19)  Antimicrobials:  none  Interim history/subjective:  Not responsive. Had MRI last night. Early this morning stat head CT ordered for suspected decorticate posturing.   Objective   Blood pressure 123/80, pulse 70, temperature 99.4 F (37.4 C), temperature source Axillary, resp. rate (!) 24, height 5' (1.524 m), weight 63.5 kg, SpO2 100 %.    Vent Mode: PRVC FiO2 (%):  [40 %] 40 % Set Rate:  [24 bmp] 24 bmp Vt Set:  [480 mL] 480 mL PEEP:  [5 cmH20] 5 cmH20 Plateau Pressure:  [14 cmH20-16 cmH20] 14 cmH20   Intake/Output Summary (Last 24 hours) at 01/08/2020 0839 Last data filed at 01/08/2020 0700 Gross per 24 hour  Intake 1344.9 ml  Output --  Net 1344.9 ml   Filed Weights   01/21/2020 1334  Weight: 63.5 kg    Examination: General: intubated HENT: ETT to vent Lungs: breathes over vent.   Cardiovascular: RRR Abdomen: soft Extremities: no edema Neuro: not sedated. Not responsive. +cough and gag does not withdraw to painful stimuli. Upwards babinski bilaterally.  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 - Aspirin 300 mg suppository.  - prognosis is very poor. Discussed with neurology this morning. Started hypertonic saline this morning. See family updated  below. - lamictal, statin held due to lack of enteral access  Acute hypoxemic respiratory failure - intubated 4/2 for clinical coma - lung protective ventilation - daily SBT/SAT  Oliguric AKI -  baseline Cr around 0.8, plateau at 1.24 today, down trending. - suspect pre-renal etiology due to shock  Elevated Troponin - suspect demand ischemia - echo consistent with diastolic dysfunction, LVEF preserved. - EKG 4/2 with NSR, normal axis. Qtc 493. No st segment changes.  Best practice:  Diet: NPO will hold on cor-trak given eventual plan for comfort measures. Pain/Anxiety/Delirium protocol (if indicated): holding all sedation for mental status. VAP protocol (if indicated): ordered DVT prophylaxis: SCDs GI prophylaxis: pepcid Glucose control: controlled<180 Foley required for critical illness Mobility: bed rest Code Status: DNR. Discussed and reviewed imaging of multiple bilateral strokes with son at bedside. Concern for aphasia and left sided hemiplegia/paresis in the best case scenario. Son said she would not want to live like this. He is going to go call family to be with him, including the patient's mother. Plan is to transition to comfort measures once family has been notified. She is now a DNR. Family Communication: son  Updated at bedside 4/4 Disposition: requires ICU.    Labs   CBC: Recent Labs  Lab 01/12/2020 1432 01/08/2020 1432 01/14/2020 1438 01/08/2020 1535 01/11/2020 2034 01/07/20 0443 01/07/20 0620  WBC 9.6  --   --   --  14.4*  --  9.1  NEUTROABS 8.8*  --   --   --  12.4*  --   --   HGB 7.9*   < > 8.2* 12.2 12.2 10.2* 10.5*  HCT 28.1*   < > 24.0* 36.0 37.5 30.0* 32.2*  MCV 114.2*  --   --   --  97.4  --  96.1  PLT 226  --   --   --  366  --  309   < > = values in this interval not displayed.    Basic Metabolic Panel: Recent Labs  Lab 01/08/2020 1438 01/31/2020 1535 01/20/2020 1909 01/07/20 0443 01/07/20 0620  NA 147* 142 142 140 138  K 3.1* 4.1 4.1 3.4* 3.5  CL 116*  --  109  --  109  CO2  --   --  17*  --  18*  GLUCOSE 63*  --  119*  --  125*  BUN 10  --  14  --  21*  CREATININE 0.80  --  1.26*  --  1.24*  CALCIUM  --   --  8.1*  --  8.2*  MG  --   --   1.7  --   --   PHOS  --   --  2.4*  --   --    GFR: Estimated Creatinine Clearance: 40.6 mL/min (A) (by C-G formula based on SCr of 1.24 mg/dL (H)). Recent Labs  Lab 01/31/2020 1432 01/08/2020 1433 01/20/2020 1909 01/17/2020 2034 01/07/20 0620  WBC 9.6  --   --  14.4* 9.1  LATICACIDVEN  --  5.5* 3.1*  --   --     Liver Function Tests: Recent Labs  Lab 01/12/2020 1909  AST 494*  ALT 304*  ALKPHOS 112  BILITOT 0.4  PROT 6.0*  ALBUMIN 3.1*   No results for input(s): LIPASE, AMYLASE in the last 168 hours. No results for input(s): AMMONIA in the last 168 hours.  ABG    Component Value Date/Time   PHART 7.437 01/07/2020 0443   PCO2ART 27.7 (  L) 01/07/2020 0443   PO2ART 178.0 (H) 01/07/2020 0443   HCO3 18.5 (L) 01/07/2020 0443   TCO2 19 (L) 01/07/2020 0443   ACIDBASEDEF 4.0 (H) 01/07/2020 0443   O2SAT 100.0 01/07/2020 0443     Coagulation Profile: No results for input(s): INR, PROTIME in the last 168 hours.  Cardiac Enzymes: Recent Labs  Lab 01/16/2020 1909  CKTOTAL 917*    HbA1C: Hgb A1c MFr Bld  Date/Time Value Ref Range Status  01/17/2020 07:09 PM 6.3 (H) 4.8 - 5.6 % Final    Comment:    (NOTE) Pre diabetes:          5.7%-6.4% Diabetes:              >6.4% Glycemic control for   <7.0% adults with diabetes     CBG: Recent Labs  Lab 01/10/2020 1326  GLUCAP 86    Critical care time:   The patient is critically ill with multiple organ systems failure and requires high complexity decision making for assessment and support, frequent evaluation and titration of therapies, application of advanced monitoring technologies and extensive interpretation of multiple databases.   Critical Care Time devoted to patient care services described in this note is 41 minutes. This time reflects the time of my personal involvement. This critical care time does not reflect separately billable procedures or procedure time, teaching time or supervisory time of PA/NP/Med student/Med  Resident etc but could involve care discussion time.  [facility] 01/08/2020 8:39 AM  Pager: [phone] After hours pager: [phone]

## 2020-01-08 NOTE — Progress Notes (Signed)
0625- pt started tensing up with suspected decorticate posturing with tachycardia. CCM & neuro made aware.   Stat CT ordered; respiratory called at [phone], recalled at [phone] and was told that they were in report and they would be up on the floor afterwards. Was told no one was available for assistance until after report.   Neuro notified of delay

## 2020-01-08 NOTE — Progress Notes (Signed)
Chaplain rec'd second page from nurse.  Son now ready to see chaplain.  Chaplain had a long talk with son.  He  Had just spoken to the doctor and was more realistic about his mother's prognosis.  "She's not long for this world. I need to call some folks."  Son said he felt responsible for mother's illness, saying  He had taken care of her.  Son is a recovering alcoholic that lives with his mother. Two of his concerns, are where is he going to live when dies, and if he will maintain sobriety through his grief. Chaplain had discussion with son about his journey to recovery and suggested he reach out to [place] and find sponsor.  Chaplain offered prayer for mother and will be available through the day if needed. Chaplain offered son sympathy and support, recognizing it is [place] and hard day.    Rev. Tamsen Snider Pager [phone]

## 2020-01-08 NOTE — Progress Notes (Signed)
Chaplain responded to page from nurse.  Chaplain consulted nurse as to patient's health and family situation.  Chaplain was planning to visit but doctor there.  Patient's son is bedside, wishing he said for his pastor to come, but knows he is working today on Easter.  Chaplain said she would return later when time more convenient. Rev. Tamsen Snider Pager [phone]

## 2020-01-08 NOTE — Progress Notes (Signed)
Pharmacy notified of sodium levels at 149, which is movement greater than 8 within the first 24 hours.  Paged Dr.Kirkpatrick and new orders to drop 3% to a rate of 40. Pharmacy alerted of change.

## 2020-01-08 NOTE — Progress Notes (Signed)
RT NOTES: Patient transported to CT and back to room 4N15 on vent without incident.

## 2020-01-09 LAB — MRSA PCR SCREENING: MRSA by PCR: NEGATIVE

## 2020-01-09 LAB — SODIUM
Sodium: 150 mmol/L — ABNORMAL HIGH (ref 135–145)
Sodium: 152 mmol/L — ABNORMAL HIGH (ref 135–145)

## 2020-01-09 MED ORDER — MORPHINE BOLUS VIA INFUSION
5.0000 mg | INTRAVENOUS | Status: DC | PRN
  Administered 2020-01-09: 5 mg via INTRAVENOUS
  Filled 2020-01-09: qty 5

## 2020-01-09 MED ORDER — GLYCOPYRROLATE 0.2 MG/ML IJ SOLN
0.2000 mg | INTRAMUSCULAR | Status: DC | PRN
  Administered 2020-01-09: 0.2 mg via INTRAVENOUS
  Filled 2020-01-09: qty 1

## 2020-01-09 MED ORDER — ACETAMINOPHEN 650 MG RE SUPP: 650.0000 mg | Freq: Four times a day (QID) | RECTAL | Status: DC | PRN

## 2020-01-09 MED ORDER — MORPHINE SULFATE (PF) 2 MG/ML IV SOLN: 2.0000 mg | INTRAVENOUS | Status: DC | PRN

## 2020-01-09 MED ORDER — DIPHENHYDRAMINE HCL 50 MG/ML IJ SOLN: 25.0000 mg | INTRAMUSCULAR | Status: DC | PRN

## 2020-01-09 MED ORDER — GLYCOPYRROLATE 0.2 MG/ML IJ SOLN: 0.2000 mg | INTRAMUSCULAR | Status: DC | PRN

## 2020-01-09 MED ORDER — DEXTROSE 5 % IV SOLN: INTRAVENOUS | Status: DC

## 2020-01-09 MED ORDER — MORPHINE 100MG IN NS 100ML (1MG/ML) PREMIX INFUSION
0.0000 mg/h | INTRAVENOUS | Status: DC
  Administered 2020-01-09: 5 mg/h via INTRAVENOUS
  Filled 2020-01-09: qty 100

## 2020-01-09 MED ORDER — ACETAMINOPHEN 325 MG PO TABS: 650.0000 mg | ORAL_TABLET | Freq: Four times a day (QID) | ORAL | Status: DC | PRN

## 2020-01-09 MED ORDER — POLYVINYL ALCOHOL 1.4 % OP SOLN: 1.0000 [drp] | Freq: Four times a day (QID) | OPHTHALMIC | Status: DC | PRN

## 2020-01-09 MED ORDER — GLYCOPYRROLATE 1 MG PO TABS: 1.0000 mg | ORAL_TABLET | ORAL | Status: DC | PRN

## 2020-01-11 LAB — CULTURE, BLOOD (ROUTINE X 2)
Culture: NO GROWTH
Culture: NO GROWTH

## 2020-02-04 NOTE — Progress Notes (Addendum)
NAME:  Stacey Carroll, MRN:  [id number], DOB:  1960/11/01, LOS: 3 ADMISSION DATE:  01/17/2020,  REFERRING MD:  EDP, CHIEF COMPLAINT:  encephalopathy  Brief History   Stacey Carroll is a 59 y.o. woman with history of oropharyngeal cancer in remission, dysphagia, chronic pain on multiple pain medications who was found unresponsive on 01/17/2020 at home. Last known well 9 hours prior to discovery. Difficult airway with multiple attempts at securing airway in the field. Arrived to [facility] in shock. Head CT demonstrated multifocal subacute strokes. Admitted to 4N ICU for further management  Consults:  neurology  Procedures:  4/2 Intubation 4/4-started 3% saline, made DNR, transitioning to comfort care when family arrives  Significant Diagnostic Tests:  Head CT 4/2-right PCA territory infarct, additional infarction in left parietal and right frontal.  MRI Brain 4/3 Large areas of restricted diffusion are present involving bilateral cerebral hemispheres, right greater than left, in MCA and PCA territories. There is involvement of the right greater than left deep Radziewicz nuclei as well as the right greater than left cerebellar hemispheres. ACA territory is relatively spared instead with bilateral involvement of ACA/MCA watershed territory. Some of the infarcts demonstrate corresponding susceptibility consistent with petechial hemorrhage.  Echocardiogram 4/3 LVEF 123456, grade 1 diastolic dysfunction, mild reduction in RV systolic function.  Micro Data:  Blood cultures 4/2 pending Respiratory pane 4/2 negative (flu A/B, Covid19)  Antimicrobials:  none  Interim history/subjective:  No acute events overnight.  Remains on the ventilator with poor mental status  Objective   Blood pressure (!) 141/88, pulse 76, temperature 99.8 F (37.7 C), temperature source Axillary, resp. rate (!) 24, height 5' (1.524 m), weight 63.5 kg, SpO2 100 %.    Vent Mode: PRVC FiO2 (%):  [40 %] 40 % Set Rate:  [24  bmp] 24 bmp Vt Set:  [480 mL] 480 mL PEEP:  [5 cmH20] 5 cmH20 Plateau Pressure:  [14 cmH20-18 cmH20] 18 cmH20   Intake/Output Summary (Last 24 hours) at January 28, 2020 0748 Last data filed at 01-28-2020 0600 Gross per 24 hour  Intake 2670.22 ml  Output 2700 ml  Net -29.78 ml   Filed Weights   01/31/2020 1334  Weight: 63.5 kg    Examination: Gen:      No acute distress HEENT:  EOMI, sclera anicteric Neck:     No masses; no thyromegaly Lungs:    Clear to auscultation bilaterally; normal respiratory effort CV:         Regular rate and rhythm; no murmurs Abd:      + bowel sounds; soft, non-tender; no palpable masses, no distension Ext:    No edema; adequate peripheral perfusion Skin:      Warm and dry; no rash Neuro: Comatose, unresponsive  Labs significant for sodium 150, creatinine 1.24 No new imaging  Resolved Hospital Problem list   shock  Assessment & Plan:  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 Continue aspirin PR Lipitor, Lamictal on hold due to lack of enteral access  Acute hypoxemic respiratory failure Continue ventilatory support No SBT's due to mental status. Plan for terminal extubation when family is ready.  Oliguric AKI Creatinine is stable.  Follow labs.  Elevated Troponin, demand ischemia Telemetry monitoring.  Goals of care Discussed with son at bedside.  Given extent of stroke prognosis for meaningful recovery is poor.  She will likely have permanent neurologic deficits, aphasia, hemiparesis/plegia Son is clear  that patient would not want to be supported in this situation.  She has a living will that states this clearly.  We are awaiting arrival of rest of family later today and the plan is for transitioning to comfort care after they had a chance to visit.  Best practice:  Diet: NPO will hold on cor-trak given eventual plan for comfort  measures. Pain/Anxiety/Delirium protocol (if indicated): holding all sedation for mental status. VAP protocol (if indicated): ordered DVT prophylaxis: SCDs GI prophylaxis: pepcid Glucose control: controlled<180 Foley required for critical illness Mobility: bed rest Code Status: DNR, See above Disposition: ICU  Critical care time:    The patient is critically ill with multiple organ system failure and requires high complexity decision making for assessment and support, frequent evaluation and titration of therapies, advanced monitoring, review of radiographic studies and interpretation of complex data.   Critical Care Time devoted to patient care services, exclusive of separately billable procedures, described in this note is 35 minutes.   Marshell Garfinkel MD [place] [facility] Please see [email or web address] for pager details.  2020/01/11, 7:49 AM

## 2020-02-04 NOTE — Progress Notes (Signed)
Family asked for RN presence, this RN and Delorise Jackson RN verified absence of heart sounds @ 1651. Son [name] as well as two other family members present at bedside.  Gold ring and clothes returned.  MD Mannam notified.

## 2020-02-04 NOTE — Discharge Summary (Signed)
Physician Death Summary  Patient ID: DAWANA CORALES MRN: [id number] DOB/AGE: November 17, 1960 59 y.o.  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 woman with history of oropharyngeal cancer in remission, dysphagia, chronic pain on multiple pain medications who was found unresponsive on 01/19/2020 at home. Last known well 9 hours prior to discovery. Difficult airway with multiple attempts at securing airway in the field. Arrived to [facility] in shock. Head CT demonstrated multifocal subacute strokes. Admitted to 4N ICU for further management.  She remains comatose with no response.  MRI shows significant bilateral MCA, PCA territory infarcts which are likely cardioembolic.  Findings on examination also suggestive of possible brainstem stroke.  Prognosis for recovery was extremely poor.   On discussions with family and son, patient had previously DNR and stated clearly in her living will that she would not want to be supported in a situation like this.  Per request of the family will transition to comfort care with compassionate extubation on 01-29-2023.  Patient passed away shortly thereafter  Signed: Marshell Garfinkel MD [place] [facility] Please see [email or web address] for pager details.  January 29, 2020, 5:42 PM

## 2020-02-04 NOTE — Procedures (Signed)
Extubation Procedure Note  Patient Details:   Name: Stacey Carroll DOB: 1961/05/11 MRN: [id number]   Airway Documentation:    Vent end date: (not recorded) Vent end time: (not recorded)   Evaluation  O2 sats: stable throughout Complications: No apparent complications Patient did tolerate procedure well. Bilateral Breath Sounds: Clear, Diminished   No   Patient was extubated to room air per a terminal withdrawal order. Family and RN at bedside with RT during extubation.  Tiburcio Bash 01/21/2020, 12:34 PM

## 2020-02-04 NOTE — Progress Notes (Signed)
Nutrition Brief Note  Chart reviewed. Spoke with RN. Pt now transitioning to comfort care.  No nutrition interventions planned at this time.  Please re-consult as needed.   Lockie Pares., RD, LDN, CNSC See AMiON for contact information

## 2020-02-04 DEATH — deceased
